# Patient Record
Sex: Male | Born: 1965 | Race: Black or African American | Hispanic: No | Smoking: Former smoker
Health system: Southern US, Community
[De-identification: ages and names within clinical notes are randomized; demographics above are authoritative.]

## PROBLEM LIST (undated history)

## (undated) DIAGNOSIS — J449 Chronic obstructive pulmonary disease, unspecified: Secondary | ICD-10-CM

## (undated) DIAGNOSIS — C61 Malignant neoplasm of prostate: Secondary | ICD-10-CM

## (undated) DIAGNOSIS — E7800 Pure hypercholesterolemia, unspecified: Secondary | ICD-10-CM

## (undated) DIAGNOSIS — G4730 Sleep apnea, unspecified: Secondary | ICD-10-CM

## (undated) DIAGNOSIS — K469 Unspecified abdominal hernia without obstruction or gangrene: Secondary | ICD-10-CM

## (undated) DIAGNOSIS — I10 Essential (primary) hypertension: Secondary | ICD-10-CM

## (undated) HISTORY — PX: SHOULDER SURGERY: SHX246

## (undated) HISTORY — PX: PROSTATE BIOPSY: SHX241

---

## 1999-06-16 ENCOUNTER — Emergency Department (HOSPITAL_COMMUNITY): Admission: EM | Admit: 1999-06-16 | Discharge: 1999-06-17 | Payer: Self-pay | Admitting: Internal Medicine

## 2002-05-01 ENCOUNTER — Emergency Department (HOSPITAL_COMMUNITY): Admission: EM | Admit: 2002-05-01 | Discharge: 2002-05-01 | Payer: Self-pay | Admitting: Emergency Medicine

## 2003-04-01 ENCOUNTER — Emergency Department (HOSPITAL_COMMUNITY): Admission: AD | Admit: 2003-04-01 | Discharge: 2003-04-01 | Payer: Self-pay | Admitting: Emergency Medicine

## 2003-08-13 ENCOUNTER — Emergency Department (HOSPITAL_COMMUNITY): Admission: EM | Admit: 2003-08-13 | Discharge: 2003-08-13 | Payer: Self-pay | Admitting: Emergency Medicine

## 2005-01-29 ENCOUNTER — Emergency Department (HOSPITAL_COMMUNITY): Admission: EM | Admit: 2005-01-29 | Discharge: 2005-01-30 | Payer: Self-pay | Admitting: Emergency Medicine

## 2005-02-06 ENCOUNTER — Encounter: Admission: RE | Admit: 2005-02-06 | Discharge: 2005-02-06 | Payer: Self-pay | Admitting: Orthopedic Surgery

## 2005-02-12 ENCOUNTER — Encounter: Admission: RE | Admit: 2005-02-12 | Discharge: 2005-02-12 | Payer: Self-pay | Admitting: Orthopedic Surgery

## 2006-02-07 ENCOUNTER — Emergency Department (HOSPITAL_COMMUNITY): Admission: EM | Admit: 2006-02-07 | Discharge: 2006-02-07 | Payer: Self-pay | Admitting: Emergency Medicine

## 2007-03-28 ENCOUNTER — Emergency Department (HOSPITAL_COMMUNITY): Admission: EM | Admit: 2007-03-28 | Discharge: 2007-03-29 | Payer: Self-pay | Admitting: Emergency Medicine

## 2007-04-11 ENCOUNTER — Telehealth (INDEPENDENT_AMBULATORY_CARE_PROVIDER_SITE_OTHER): Payer: Self-pay | Admitting: *Deleted

## 2007-12-07 ENCOUNTER — Emergency Department (HOSPITAL_COMMUNITY): Admission: EM | Admit: 2007-12-07 | Discharge: 2007-12-08 | Payer: Self-pay | Admitting: Emergency Medicine

## 2008-04-19 ENCOUNTER — Emergency Department (HOSPITAL_COMMUNITY): Admission: EM | Admit: 2008-04-19 | Discharge: 2008-04-19 | Payer: Self-pay | Admitting: Emergency Medicine

## 2010-06-20 ENCOUNTER — Encounter: Payer: Self-pay | Admitting: Emergency Medicine

## 2011-02-24 LAB — COMPREHENSIVE METABOLIC PANEL WITH GFR
ALT: 28
AST: 18
Alkaline Phosphatase: 77
GFR calc Af Amer: >60
Glucose, Bld: 140 — ABNORMAL HIGH
Potassium: 3.7
Sodium: 139
Total Protein: 7

## 2011-02-24 LAB — URINALYSIS, ROUTINE W REFLEX MICROSCOPIC
Bilirubin Urine: NEGATIVE
Glucose, UA: NEGATIVE
Hgb urine dipstick: NEGATIVE
Ketones, ur: NEGATIVE
Nitrite: NEGATIVE
Protein, ur: NEGATIVE
Specific Gravity, Urine: 1.019
Urobilinogen, UA: 1
pH: 7

## 2011-02-24 LAB — CBC
HCT: 42.6
Hemoglobin: 14.5
MCHC: 34.1
MCV: 86.8
Platelets: 176
RBC: 4.91
RDW: 14.4
WBC: 8.6

## 2011-02-24 LAB — DIFFERENTIAL
Basophils Absolute: 0.1
Basophils Relative: 1
Eosinophils Absolute: 0.1
Eosinophils Relative: 1
Lymphocytes Relative: 27
Lymphs Abs: 2.4
Monocytes Absolute: 0.6
Monocytes Relative: 7
Neutro Abs: 5.5
Neutrophils Relative %: 64

## 2011-02-24 LAB — COMPREHENSIVE METABOLIC PANEL
Albumin: 3.4 — ABNORMAL LOW
BUN: 15
CO2: 27
Calcium: 9.8
Chloride: 104
Creatinine, Ser: 0.88
GFR calc non Af Amer: >60
Total Bilirubin: 0.6

## 2011-02-24 LAB — LIPASE, BLOOD: Lipase: 18

## 2011-03-09 LAB — DIFFERENTIAL
Basophils Relative: 1
Lymphocytes Relative: 36
Lymphs Abs: 2.8
Monocytes Relative: 7
Neutro Abs: 4.3
Neutrophils Relative %: 55

## 2011-03-09 LAB — I-STAT 8, (EC8 V) (CONVERTED LAB)
Acid-Base Excess: 1
Chloride: 103
HCT: 48
Operator id: 234501
Potassium: 3.9
pCO2, Ven: 52.5 — ABNORMAL HIGH

## 2011-03-09 LAB — CBC
RBC: 4.86
WBC: 7.9

## 2011-03-09 LAB — POCT I-STAT CREATININE: Creatinine, Ser: 1

## 2011-03-09 LAB — URINALYSIS, ROUTINE W REFLEX MICROSCOPIC
Nitrite: NEGATIVE
Specific Gravity, Urine: 1.025
Urobilinogen, UA: 1

## 2011-08-06 ENCOUNTER — Encounter (HOSPITAL_COMMUNITY): Payer: Self-pay | Admitting: *Deleted

## 2011-08-06 ENCOUNTER — Other Ambulatory Visit: Payer: Self-pay

## 2011-08-06 ENCOUNTER — Emergency Department (HOSPITAL_COMMUNITY): Payer: Self-pay

## 2011-08-06 ENCOUNTER — Emergency Department (HOSPITAL_COMMUNITY)
Admission: EM | Admit: 2011-08-06 | Discharge: 2011-08-06 | Disposition: A | Discharge status: Home / Self Care | Payer: Self-pay | Attending: Emergency Medicine | Admitting: Emergency Medicine

## 2011-08-06 DIAGNOSIS — R000 Tachycardia, unspecified: Secondary | ICD-10-CM | POA: Insufficient documentation

## 2011-08-06 DIAGNOSIS — R0602 Shortness of breath: Secondary | ICD-10-CM | POA: Insufficient documentation

## 2011-08-06 DIAGNOSIS — Z794 Long term (current) use of insulin: Secondary | ICD-10-CM | POA: Insufficient documentation

## 2011-08-06 DIAGNOSIS — R42 Dizziness and giddiness: Secondary | ICD-10-CM | POA: Insufficient documentation

## 2011-08-06 DIAGNOSIS — R4182 Altered mental status, unspecified: Secondary | ICD-10-CM | POA: Insufficient documentation

## 2011-08-06 DIAGNOSIS — E119 Type 2 diabetes mellitus without complications: Secondary | ICD-10-CM | POA: Insufficient documentation

## 2011-08-06 DIAGNOSIS — E860 Dehydration: Secondary | ICD-10-CM | POA: Insufficient documentation

## 2011-08-06 DIAGNOSIS — R911 Solitary pulmonary nodule: Secondary | ICD-10-CM | POA: Insufficient documentation

## 2011-08-06 DIAGNOSIS — I10 Essential (primary) hypertension: Secondary | ICD-10-CM | POA: Insufficient documentation

## 2011-08-06 HISTORY — DX: Essential (primary) hypertension: I10

## 2011-08-06 HISTORY — DX: Pure hypercholesterolemia, unspecified: E78.00

## 2011-08-06 LAB — D-DIMER, QUANTITATIVE: D-Dimer, Quant: 1.28 ug/mL-FEU — ABNORMAL HIGH (ref 0.00–0.48)

## 2011-08-06 LAB — BASIC METABOLIC PANEL
Calcium: 9.8 mg/dL (ref 8.4–10.5)
Creatinine, Ser: 0.83 mg/dL (ref 0.50–1.35)
GFR calc Af Amer: >90 mL/min (ref >90)
GFR calc non Af Amer: >90 mL/min (ref >90)

## 2011-08-06 LAB — GLUCOSE, CAPILLARY: Glucose-Capillary: 233 mg/dL — ABNORMAL HIGH (ref 70–99)

## 2011-08-06 LAB — DIFFERENTIAL
Basophils Absolute: 0 10*3/uL (ref 0.0–0.1)
Basophils Relative: 0 % (ref 0–1)
Eosinophils Relative: 0 % (ref 0–5)
Monocytes Absolute: 0.6 10*3/uL (ref 0.1–1.0)
Neutro Abs: 6.3 10*3/uL (ref 1.7–7.7)

## 2011-08-06 LAB — CBC
HCT: 40.3 % (ref 39.0–52.0)
MCHC: 35 g/dL (ref 30.0–36.0)
RDW: 14.1 % (ref 11.5–15.5)

## 2011-08-06 MED ORDER — SODIUM CHLORIDE 0.9 % IV BOLUS (SEPSIS): 1000.0000 mL | Freq: Once | INTRAVENOUS | Status: DC

## 2011-08-06 MED ORDER — IOHEXOL 300 MG/ML  SOLN
100.0000 mL | Freq: Once | INTRAMUSCULAR | Status: AC | PRN
  Administered 2011-08-06: 100 mL via INTRAVENOUS

## 2011-08-06 NOTE — ED Notes (Signed)
Pt's visitor came to ER window requesting help to get pt out of the car. Pt reported that he could not breathe and was having chest pain. Pt brought to ER window. Vital signs: BP 181/107, heart rate 123, O2 sats 100%, respirations 22. Pt states that he took some insulin prior to arrival and thinks that blood sugar may be low.

## 2011-08-06 NOTE — ED Notes (Signed)
Pt brought to ED by neighbor with reports of having a hard time catching breath and a dry mouth, pt noted to be slow to respond which is a change per neighbor, pt also noted to stare for brief intervals before responding.

## 2011-08-06 NOTE — ED Provider Notes (Signed)
History     CSN: [id number]  Arrival date & time 08/06/11  1607   First MD Initiated Contact with Patient 08/06/11 1650      Chief Complaint  Patient presents with  . Shortness of Breath  . Altered Mental Status    (Consider location/radiation/quality/duration/timing/severity/associated sxs/prior treatment) HPI  45yoM h/o insulin DDM, HTN, HLD since with lightheadedness, shortness of breath which he describes as "difficulty catching my breath when I felt dizzy". The patient states he had several episodes of lightheadedness prior to arrival. He states that it was not associated with standing up or sitting up. He states the episodes have been resolving spontaneously. He does so presyncopal. He denies headache, dizziness, change in vision. He denies chest pain. However he states that he feels like he can't take a deep breath and it has some shortness of breath with these episodes. He states he feels mildly dehydrated now because he hasn't had anything to drink. He is to stare off in space oriented neighbor during these episodes. He denies numbness, tingling, weakness of his extremities. No h/o CVA. Denies h/o VTE in self or family. No recent hosp/surg/immob. No h/o cancer. Denies exogenous hormone use, no leg pain or swelling   ED Notes, ED Provider Notes from 08/06/11 0000 to 08/06/11 16:33:41       Alvina Chou, RN 08/06/2011 16:28      Pt brought to ED by neighbor with reports of having a hard time catching breath and a dry mouth, pt noted to be slow to respond which is a change per neighbor, pt also noted to stare for brief intervals before responding.         Darla Lesches, RN 08/06/2011 16:15      Pt's visitor came to ER window requesting help to get pt out of the car. Pt reported that he could not breathe and was having chest pain. Pt brought to ER window. Vital signs: BP 181/107, heart rate 123, O2 sats 100%, respirations 22. Pt states that he took some insulin prior to arrival and  thinks that blood sugar may be low.    Past Medical History  Diagnosis Date  . Diabetes mellitus   . Hypertension   . High cholesterol     Past Surgical History  Procedure Date  . Shoulder surgery     right    History reviewed. No pertinent family history.  History  Substance Use Topics  . Smoking status: Current Everyday Smoker -- 0.5 packs/day  . Smokeless tobacco: Never Used  . Alcohol Use: Yes     socially    Review of Systems  All other systems reviewed and are negative.  except as noted HPI   Allergies  Review of patient's allergies indicates no known allergies.  Home Medications   Current Outpatient Rx  Name Route Sig Dispense Refill  . INSULIN GLARGINE 100 UNIT/ML [place] SOLN Subcutaneous Inject 55 Units into the skin at bedtime.    Marland Kitchen LISINOPRIL 10 MG PO TABS Oral Take 10 mg by mouth daily.    Marland Kitchen METFORMIN HCL 500 MG PO TABS Oral Take 1,500 mg by mouth daily with breakfast.    . SIMVASTATIN 20 MG PO TABS Oral Take 20 mg by mouth daily.      BP 165/84  Pulse 105  Temp(Src) 99 F (37.2 C) (Oral)  Resp 28  SpO2 100%  Physical Exam  Nursing note and vitals reviewed. Constitutional: He is oriented to person, place, and time. He  appears well-developed and well-nourished. No distress.  HENT:  Head: Atraumatic.       Mm dry  Eyes: Conjunctivae are normal. Pupils are equal, round, and reactive to light.  Neck: Neck supple.  Cardiovascular: Regular rhythm, normal heart sounds and intact distal pulses.  Exam reveals no gallop and no friction rub.   No murmur heard.      tachycardic  Pulmonary/Chest: Effort normal. No respiratory distress. He has no wheezes. He has no rales.  Abdominal: Soft. Bowel sounds are normal. There is no tenderness. There is no rebound and no guarding.  Musculoskeletal: Normal range of motion. He exhibits no edema and no tenderness.  Neurological: He is alert and oriented to person, place, and time. No cranial nerve deficit. He  exhibits normal muscle tone. Coordination normal.  Skin: Skin is warm and dry.  Psychiatric: He has a normal mood and affect.    Date: 08/06/2011  Rate: 102  Rhythm: sinus tachycardia  QRS Axis: normal  Intervals: normal  ST/T Wave abnormalities: nonspecific T wave changes t wave inversions inf lat leads  Conduction Disutrbances:none  Narrative Interpretation:   Old EKG Reviewed: none available  ED Course  Procedures (including critical care time)  Labs Reviewed  GLUCOSE, CAPILLARY - Abnormal; Notable for the following:    Glucose-Capillary 233 (*)    All other components within normal limits  BASIC METABOLIC PANEL - Abnormal; Notable for the following:    Glucose, Bld 241 (*)    All other components within normal limits  D-DIMER, QUANTITATIVE - Abnormal; Notable for the following:    D-Dimer, Quant 1.28 (*)    All other components within normal limits  CBC  DIFFERENTIAL  TROPONIN I   Dg Chest 2 View  08/06/2011  *RADIOLOGY REPORT*  Clinical Data: Shortness of breath.  Altered mental status. Smoker.  Hypertension.  CHEST - 2 VIEW  Comparison: 02/07/2006.  Findings: Stable enlarged cardiac silhouette and prominent pulmonary vasculature. Prominent interstitial markings with mild progression.  Thoracic spine degenerative changes.  IMPRESSION:  1.  Mild increase in prominence of the interstitial markings.  This is most likely due to mildly progressive chronic interstitial lung disease.  Interstitial pulmonary edema is less likely. 2.  Stable cardiomegaly and pulmonary vascular congestion.  Original Report Authenticated By: Darrol Angel, M.D.   Ct Angio Chest W/cm &/or Wo Cm  08/06/2011  *RADIOLOGY REPORT*  Clinical Data: Shortness of breath and altered mental status.  CT ANGIOGRAPHY CHEST  Technique:  Multidetector CT imaging of the chest using the standard protocol during bolus administration of intravenous contrast. Multiplanar reconstructed images including MIPs were obtained and  reviewed to evaluate the vascular anatomy.  Contrast: OMNIPAQUE IOHEXOL 300 MG/ML IJ SOLN  Comparison: 02/07/2006 and chest radiographs obtained earlier today.  Findings: Again demonstrated is suboptimal opacification of the pulmonary arteries, possibly due to poor cardiac output.  No gross pulmonary arterial filling defects are seen.  The previously demonstrated 3 mm subpleural right upper lobe nodule continues to measure 3 mm in maximum diameter on image number 24. A 4 mm subpleural nodule in the right lower lobe on image number 55 is better visualized on the sagittal reconstruction image number 62. This is slightly larger and better visualized than seen on the previous sagittal reconstruction image number 59.  No new lung nodules and no enlarged lymph nodes.  Thoracic spine degenerative changes.  The included portion of the upper abdomen is unremarkable.  Again demonstrated is some limitation due to the  large size of the patient with resultant photon starvation.  IMPRESSION:  1.  Suboptimally opacified pulmonary arteries with no gross pulmonary emboli seen. 2. Mild increase in size of a 4 mm subpleural nodule in the right lower lobe.  The slow increase in size over a 5.5 year period of time is compatible with a benign process. 3.  Stable 3 mm right upper lobe nodule. 4.  No acute abnormality.  Original Report Authenticated By: Darrol Angel, M.D.    1. Dizziness   2. Dehydration   3. Pulmonary nodule    MDM  The patient presents with intermittent lightheadedness and "difficulty catching my breath" which has resolved. Patient feels improved at this time. Tachycardic on arrival with chronic interstitial changes on his chest x-ray. CT angiography chest is negative for pulmonary embolus. He has benign appearing pulmonary nodules which are very small. He will follow with his primary care doctor for this. His original triage note they state that the patient had chest pain. Canals are shared with him the  abnormal EKG with T-wave inversions. The patient states that he would like to go home and he does not have chest pain at this time. Clinically, I do not feel that the patient is having an NSTEMI, although he does have RF for ACS. The patient denies chest pain with me. I asked her specifically about did note and he states that he did not say that he had chest pain. Clinically, he appears dehydrated and not clinically fluid overloaded. His tachycardia the 120s this resolved after 1 L of IV fluids. He states he feels much better. He is ambulatory here without lightheadedness. He feels back to his baseline. I did share the abnormal CT scan with the patient and he states he will follow with his physician to follow with the pulmonary nodules.        Forbes Cellar, MD 08/06/11 2159

## 2011-08-06 NOTE — Discharge Instructions (Signed)
Dehydration, Adult Dehydration is when you lose more fluids from the body than you take in. Vital organs like the kidneys, brain, and heart cannot function without a proper amount of fluids and salt. Any loss of fluids from the body can cause dehydration.  CAUSES   Vomiting.   Diarrhea.   Excessive sweating.   Excessive urine output.   Fever.  SYMPTOMS  Mild dehydration  Thirst.   Dry lips.   Slightly dry mouth.  Moderate dehydration  Very dry mouth.   Sunken eyes.   Skin does not bounce back quickly when lightly pinched and released.   Dark urine and decreased urine production.   Decreased tear production.   Headache.  Severe dehydration  Very dry mouth.   Extreme thirst.   Rapid, weak pulse (more than 100 beats per minute at rest).   Cold hands and feet.   Not able to sweat in spite of heat and temperature.   Rapid breathing.   Blue lips.   Confusion and lethargy.   Difficulty being awakened.   Minimal urine production.   No tears.  DIAGNOSIS  Your caregiver will diagnose dehydration based on your symptoms and your exam. Blood and urine tests will help confirm the diagnosis. The diagnostic evaluation should also identify the cause of dehydration. TREATMENT  Treatment of mild or moderate dehydration can often be done at home by increasing the amount of fluids that you drink. It is best to drink small amounts of fluid more often. Drinking too much at one time can make vomiting worse. Refer to the home care instructions below. Severe dehydration needs to be treated at the hospital where you will probably be given intravenous (IV) fluids that contain water and electrolytes. HOME CARE INSTRUCTIONS   Ask your caregiver about specific rehydration instructions.   Drink enough fluids to keep your urine clear or pale yellow.   Drink small amounts frequently if you have nausea and vomiting.   Eat as you normally do.   Avoid:   Foods or drinks high in  sugar.   Carbonated drinks.   Juice.   Extremely hot or cold fluids.   Drinks with caffeine.   Fatty, greasy foods.   Alcohol.   Tobacco.   Overeating.   Gelatin desserts.   Wash your hands well to avoid spreading bacteria and viruses.   Only take over-the-counter or prescription medicines for pain, discomfort, or fever as directed by your caregiver.   Ask your caregiver if you should continue all prescribed and over-the-counter medicines.   Keep all follow-up appointments with your caregiver.  SEEK MEDICAL CARE IF:  You have abdominal pain and it increases or stays in one area (localizes).   You have a rash, stiff neck, or severe headache.   You are irritable, sleepy, or difficult to awaken.   You are weak, dizzy, or extremely thirsty.  SEEK IMMEDIATE MEDICAL CARE IF:   You are unable to keep fluids down or you get worse despite treatment.   You have frequent episodes of vomiting or diarrhea.   You have blood or green matter (bile) in your vomit.   You have blood in your stool or your stool looks black and tarry.   You have not urinated in 6 to 8 hours, or you have only urinated a small amount of very dark urine.   You have a fever.   You faint.  MAKE SURE YOU:   Understand these instructions.   Will watch your condition.     Will get help right away if you are not doing well or get worse.  Document Released: 05/16/2005 Document Revised: 05/05/2011 Document Reviewed: 01/03/2011 [facility] Patient Information 2012 [place], [place]. Dizziness Dizziness is a common problem. It is a feeling of unsteadiness or lightheadedness. You may feel like you are about to faint. Dizziness can lead to injury if you stumble or fall. A person of any age group can suffer from dizziness, but dizziness is more common in older adults. CAUSES  Dizziness can be caused by many different things, including:  Middle ear problems.   Standing for too long.   Infections.   An  allergic reaction.   Aging.   An emotional response to something, such as the sight of blood.   Side effects of medicines.   Fatigue.   Problems with circulation or blood pressure.   Excess use of alcohol, medicines, or illegal drug use.   Breathing too fast (hyperventilation).   An arrhythmia or problems with your heart rhythm.   Low red blood cell count (anemia).   Pregnancy.   Vomiting, diarrhea, fever, or other illnesses that cause dehydration.   Diseases or conditions such as Parkinson's disease, high blood pressure (hypertension), diabetes, and thyroid problems.   Exposure to extreme heat.  DIAGNOSIS  To find the cause of your dizziness, your caregiver may do a physical exam, lab tests, radiologic imaging scans, or an electrocardiography test (ECG).  TREATMENT  Treatment of dizziness depends on the cause of your symptoms and can vary greatly. HOME CARE INSTRUCTIONS   Drink enough fluids to keep your urine clear or pale yellow. This is especially important in very hot weather. In the elderly, it is also important in cold weather.   If your dizziness is caused by medicines, take them exactly as directed. When taking blood pressure medicines, it is especially important to get up slowly.   Rise slowly from chairs and steady yourself until you feel okay.   In the morning, first sit up on the side of the bed. When this seems okay, stand slowly while holding onto something until you know your balance is fine.   If you need to stand in one place for a long time, be sure to move your legs often. Tighten and relax the muscles in your legs while standing.   If dizziness continues to be a problem, have someone stay with you for a day or two. Do this until you feel you are well enough to stay alone. Have the person call your caregiver if he or she notices changes in you that are concerning.   Do not drive or use heavy machinery if you feel dizzy.  SEEK IMMEDIATE MEDICAL CARE  IF:   Your dizziness or lightheadedness gets worse.   You feel nauseous or vomit.   You develop problems with talking, walking, weakness, or using your arms, hands, or legs.   You are not thinking clearly or you have difficulty forming sentences. It may take a friend or family member to determine if your thinking is normal.   You develop chest pain, abdominal pain, shortness of breath, or sweating.   Your vision changes.   You notice any bleeding.   You have side effects from medicine that seems to be getting worse rather than better.  MAKE SURE YOU:   Understand these instructions.   Will watch your condition.   Will get help right away if you are not doing well or get worse.  Document Released: 11/09/2000 Document  Revised: 05/05/2011 Document Reviewed: 12/03/2010 [facility] Patient Information 2012 [place], [place]. RESOURCE GUIDE  Dental Problems  Patients with Medicaid: [facility] [phone] [address]                                           [phone] Phone:  [phone]                                                   Phone:  [phone]  If unable to pay or uninsured, contact:  [facility] or [facility]. to become qualified for the adult dental clinic.  Chronic Pain Problems Contact [facility]  [phone] Patients need to be referred by their primary care doctor.  Insufficient Money for Medicine Contact [employer] Way:  call "211" or [facility] (213) 305-6290.  No Primary Care Doctor Call [facility]  [phone] Other agencies that provide inexpensive medical care    [facility]  [phone]    [facility]  [phone]    [facility]  [phone]    [facility]  [phone]    [facility]  [phone]    [facility]  [phone]  [facility]  [phone] [facility]   [phone] [facility]   [phone] (emergency services [phone])  Abuse/Neglect [facility] Child Abuse Hotline 217-756-0525 [facility] Child Abuse Hotline (207) 021-6092 (After Hours)  Emergency Shelter [facility] ([phone]  [facility] [place] [facility] [phone] [facility] [phone]  MRSA Hotline #:   (705) 693-5268    [facility] Resources  [facility]  [employer]                           [facility] Dept. [address] [place]                     [address]         [address]  Blondell Reveal Phone:  [phone]                                  Phone:  [phone]                   Phone:  [phone]  [facility] Phone:  [phone]  [facility] Child Abuse Hotline [phone] (After Hours)

## 2011-08-06 NOTE — ED Notes (Signed)
Bed:WA15<BR> Expected date:<BR> Expected time:<BR> Means of arrival:<BR> Comments:<BR> Hold for triage 1 

## 2011-08-06 NOTE — ED Notes (Signed)
Pt continues to deny chest pain, keeps repeating, "I just need some air".

## 2011-08-10 ENCOUNTER — Emergency Department (HOSPITAL_COMMUNITY): Payer: Self-pay

## 2011-08-10 ENCOUNTER — Encounter (HOSPITAL_COMMUNITY): Payer: Self-pay | Admitting: Emergency Medicine

## 2011-08-10 ENCOUNTER — Emergency Department (HOSPITAL_COMMUNITY)
Admission: EM | Admit: 2011-08-10 | Discharge: 2011-08-10 | Disposition: A | Discharge status: Home / Self Care | Payer: Self-pay | Attending: Emergency Medicine | Admitting: Emergency Medicine

## 2011-08-10 ENCOUNTER — Other Ambulatory Visit: Payer: Self-pay

## 2011-08-10 DIAGNOSIS — Z794 Long term (current) use of insulin: Secondary | ICD-10-CM | POA: Insufficient documentation

## 2011-08-10 DIAGNOSIS — R791 Abnormal coagulation profile: Secondary | ICD-10-CM | POA: Insufficient documentation

## 2011-08-10 DIAGNOSIS — E785 Hyperlipidemia, unspecified: Secondary | ICD-10-CM | POA: Insufficient documentation

## 2011-08-10 DIAGNOSIS — R4182 Altered mental status, unspecified: Secondary | ICD-10-CM | POA: Insufficient documentation

## 2011-08-10 DIAGNOSIS — I10 Essential (primary) hypertension: Secondary | ICD-10-CM | POA: Insufficient documentation

## 2011-08-10 DIAGNOSIS — E119 Type 2 diabetes mellitus without complications: Secondary | ICD-10-CM | POA: Insufficient documentation

## 2011-08-10 DIAGNOSIS — F17200 Nicotine dependence, unspecified, uncomplicated: Secondary | ICD-10-CM | POA: Insufficient documentation

## 2011-08-10 DIAGNOSIS — R0602 Shortness of breath: Secondary | ICD-10-CM

## 2011-08-10 DIAGNOSIS — R42 Dizziness and giddiness: Secondary | ICD-10-CM

## 2011-08-10 LAB — DIFFERENTIAL
Basophils Absolute: 0 10*3/uL (ref 0.0–0.1)
Basophils Relative: 0 % (ref 0–1)
Eosinophils Absolute: 0.1 10*3/uL (ref 0.0–0.7)
Eosinophils Relative: 1 % (ref 0–5)
Lymphocytes Relative: 22 % (ref 12–46)
Lymphs Abs: 1.9 10*3/uL (ref 0.7–4.0)
Monocytes Absolute: 0.6 10*3/uL (ref 0.1–1.0)
Monocytes Relative: 7 % (ref 3–12)
Neutro Abs: 6.4 10*3/uL (ref 1.7–7.7)
Neutrophils Relative %: 71 % (ref 43–77)

## 2011-08-10 LAB — CBC
Hemoglobin: 15.1 g/dL (ref 13.0–17.0)
MCH: 29.4 pg (ref 26.0–34.0)
MCHC: 34.8 g/dL (ref 30.0–36.0)
Platelets: 193 10*3/uL (ref 150–400)
RBC: 5.14 MIL/uL (ref 4.22–5.81)

## 2011-08-10 LAB — BASIC METABOLIC PANEL
BUN: 14 mg/dL (ref 6–23)
CO2: 21 mEq/L (ref 19–32)
Calcium: 9 mg/dL (ref 8.4–10.5)
Chloride: 102 mEq/L (ref 96–112)
Creatinine, Ser: 0.71 mg/dL (ref 0.50–1.35)
GFR calc Af Amer: >90 mL/min (ref >90)
GFR calc non Af Amer: >90 mL/min (ref >90)
Glucose, Bld: 163 mg/dL — ABNORMAL HIGH (ref 70–99)
Potassium: 3.7 mEq/L (ref 3.5–5.1)
Sodium: 136 mEq/L (ref 135–145)

## 2011-08-10 LAB — PRO B NATRIURETIC PEPTIDE: Pro B Natriuretic peptide (BNP): 8.2 pg/mL (ref 0–125)

## 2011-08-10 LAB — TROPONIN I: Troponin I: <0.3 ng/mL (ref <0.30)

## 2011-08-10 MED ORDER — ALBUTEROL SULFATE (5 MG/ML) 0.5% IN NEBU
2.5000 mg | INHALATION_SOLUTION | Freq: Once | RESPIRATORY_TRACT | Status: AC
  Administered 2011-08-10: 2.5 mg via RESPIRATORY_TRACT

## 2011-08-10 MED ORDER — IPRATROPIUM BROMIDE 0.02 % IN SOLN
0.5000 mg | Freq: Once | RESPIRATORY_TRACT | Status: AC
  Administered 2011-08-10: 0.5 mg via RESPIRATORY_TRACT

## 2011-08-10 MED ORDER — ALBUTEROL SULFATE (5 MG/ML) 0.5% IN NEBU
2.5000 mg | INHALATION_SOLUTION | RESPIRATORY_TRACT | Status: DC
  Filled 2011-08-10: qty 0.5

## 2011-08-10 MED ORDER — LORAZEPAM 1 MG PO TABS
2.0000 mg | ORAL_TABLET | Freq: Once | ORAL | Status: AC
  Administered 2011-08-10: 2 mg via ORAL
  Filled 2011-08-10: qty 2

## 2011-08-10 MED ORDER — IPRATROPIUM BROMIDE 0.02 % IN SOLN
0.5000 mg | RESPIRATORY_TRACT | Status: DC
  Filled 2011-08-10: qty 2.5

## 2011-08-10 NOTE — Progress Notes (Signed)
Pt listed as self pay with no insurance coverage Pt confirms he is self pay [place] resident CM and [facility] coordinator spoke with him Pt offered [facility] services to assist with finding a [place] self pay provider Pt accepted information  Pt reports having a part time job

## 2011-08-10 NOTE — ED Notes (Signed)
Pt states he is feeling much better and is ready for discharge. NAD noted at this time.

## 2011-08-10 NOTE — ED Notes (Signed)
Began going over discharge instructions with pt and pt began breathing fast and crying. States "There's something wrong with me and ya'll need to figure it out." Dr. Hyman Hopes notified that pt very anxious about discharge. States she will be in to see pt shortly

## 2011-08-10 NOTE — ED Notes (Signed)
States that he was seen Saturday for same feeling of shortness of breath. 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

## 2011-08-10 NOTE — ED Notes (Signed)
Patient transported to X-ray 

## 2011-08-10 NOTE — ED Notes (Signed)
Pt states that he is feeling better. NAD noted at this time.

## 2011-08-10 NOTE — Discharge Instructions (Signed)
Dizziness Dizziness is a common problem. It is a feeling of unsteadiness or lightheadedness. You may feel like you are about to faint. Dizziness can lead to injury if you stumble or fall. A person of any age group can suffer from dizziness, but dizziness is more common in older adults. CAUSES  Dizziness can be caused by many different things, including:  Middle ear problems.   Standing for too long.   Infections.   An allergic reaction.   Aging.   An emotional response to something, such as the sight of blood.   Side effects of medicines.   Fatigue.   Problems with circulation or blood pressure.   Excess use of alcohol, medicines, or illegal drug use.   Breathing too fast (hyperventilation).   An arrhythmia or problems with your heart rhythm.   Low red blood cell count (anemia).   Pregnancy.   Vomiting, diarrhea, fever, or other illnesses that cause dehydration.   Diseases or conditions such as Parkinson's disease, high blood pressure (hypertension), diabetes, and thyroid problems.   Exposure to extreme heat.  DIAGNOSIS  To find the cause of your dizziness, your caregiver may do a physical exam, lab tests, radiologic imaging scans, or an electrocardiography test (ECG).  TREATMENT  Treatment of dizziness depends on the cause of your symptoms and can vary greatly. HOME CARE INSTRUCTIONS   Drink enough fluids to keep your urine clear or pale yellow. This is especially important in very hot weather. In the elderly, it is also important in cold weather.   If your dizziness is caused by medicines, take them exactly as directed. When taking blood pressure medicines, it is especially important to get up slowly.   Rise slowly from chairs and steady yourself until you feel okay.   In the morning, first sit up on the side of the bed. When this seems okay, stand slowly while holding onto something until you know your balance is fine.   If you need to stand in one place for a  long time, be sure to move your legs often. Tighten and relax the muscles in your legs while standing.   If dizziness continues to be a problem, have someone stay with you for a day or two. Do this until you feel you are well enough to stay alone. Have the person call your caregiver if he or she notices changes in you that are concerning.   Do not drive or use heavy machinery if you feel dizzy.  SEEK IMMEDIATE MEDICAL CARE IF:   Your dizziness or lightheadedness gets worse.   You feel nauseous or vomit.   You develop problems with talking, walking, weakness, or using your arms, hands, or legs.   You are not thinking clearly or you have difficulty forming sentences. It may take a friend or family member to determine if your thinking is normal.   You develop chest pain, abdominal pain, shortness of breath, or sweating.   Your vision changes.   You notice any bleeding.   You have side effects from medicine that seems to be getting worse rather than better.  MAKE SURE YOU:   Understand these instructions.   Will watch your condition.   Will get help right away if you are not doing well or get worse.  Document Released: 11/09/2000 Document Revised: 05/05/2011 Document Reviewed: 12/03/2010 [facility] Patient Information 2012 [place], [place].  You will need to follow up with your doctor for possible Holter monitor placement. If you cannot follow up  with your doctor, please make an appointment at one of the clinics below:  RESOURCE GUIDE  Dental Problems  Patients with Medicaid: [facility] [phone] [address]                                           [phone] [address] Phone:  [phone]                                                   Phone:  [phone]  If unable to pay or uninsured, contact:  [facility] or [facility] to become qualified for the adult dental clinic.  Chronic Pain Problems Contact [facility]  [phone] Patients need to be referred by their primary care doctor.  Insufficient Money for Medicine Contact [employer]:  call "211" or [facility] [phone].  No Primary Care Doctor Call [facility]  [phone] Other agencies that provide inexpensive medical care    [facility]  [phone]    [facility]  [phone]    [facility]  [phone]    [facility]  [phone]    [facility]  [phone]    [facility]  [phone]  Psychological Services [facility]  [phone] [facility]  [phone] [facility]   [phone] (emergency services [phone])  Abuse/Neglect [facility] Child Abuse Hotline [phone] [facility] Child Abuse Hotline 646-315-3355 (After Hours)  Emergency Shelter [facility] [phone]  [facility] [place] [facility] [phone] [facility] [phone]  MRSA Hotline #:   805-085-1513    [facility] Resources  [facility]  [employer]                           [facility] Dept. [address] [place]                     [address]         [address]  Blondell Reveal Phone:  [phone]                                  Phone:  [phone]                   Phone:  [phone]  [facility] Phone:  [phone]  [facility] Child Abuse Hotline [phone] (After Hours)

## 2011-08-10 NOTE — ED Provider Notes (Addendum)
History     CSN: [id number]  Arrival date & time 08/10/11  7829   First MD Initiated Contact with Patient 08/10/11 0857      Chief Complaint  Patient presents with  . Shortness of Breath    (Consider location/radiation/quality/duration/timing/severity/associated sxs/prior treatment) HPI  45yoM h/o IDDM, HTN, HLD pw dizziness, shortness of breath. Patient seen here 08/06/2011 for same complaint. See previous note below. He states he continues to have these episodes this morning. Describes as lightheadedness and difficulty catching his breath at that time. He denies chest pain., Headache, nausea, vomiting, diaphoresis. He denies numbness, tingling, weakness of his extremities. Denies abdominal pain. During last visit he did have abnormal EKG. His labs including troponin were unremarkable except for elevated d-dimer. CT angiography chest was unremarkable for pulmonary embolus. He was given IV fluids and his vitals normalized he was able to a really and he was discharged home with his wife.  Arrival date & time 08/06/11 1607  First MD Initiated Contact with Patient 08/06/11 1650  Chief Complaint   Patient presents with   .  Shortness of Breath   .  Altered Mental Status    (Consider location/radiation/quality/duration/timing/severity/associated sxs/prior  treatment)  HPI  45yoM h/o insulin DDM, HTN, HLD since with lightheadedness, shortness of breath which he describes as "difficulty catching my breath when I felt dizzy". The patient states he had several episodes of lightheadedness prior to arrival. He states that it was not associated with standing up or sitting up. He states the episodes have been resolving spontaneously. He does so presyncopal. He denies headache, dizziness, change in vision. He denies chest pain. However he states that he feels like he can't take a deep breath and it has some shortness of breath with these episodes. He states he feels mildly dehydrated now because he  hasn't had anything to drink. He did stare off in space according to neighbor during these episodes. He denies numbness, tingling, weakness of his extremities. No h/o CVA. Denies h/o VTE in self or family. No recent hosp/surg/immob. No h/o cancer. Denies exogenous hormone use, no leg pain or swelling  Past Medical History  Diagnosis Date  . Diabetes mellitus   . Hypertension   . High cholesterol     Past Surgical History  Procedure Date  . Shoulder surgery     right    No family history on file.  History  Substance Use Topics  . Smoking status: Current Everyday Smoker -- 0.5 packs/day  . Smokeless tobacco: Never Used  . Alcohol Use: Yes     socially      Review of Systems  All other systems reviewed and are negative.   except as noted HPI    Allergies  Review of patient's allergies indicates no known allergies.  Home Medications   Current Outpatient Rx  Name Route Sig Dispense Refill  . INSULIN GLARGINE 100 UNIT/ML [place] SOLN Subcutaneous Inject 55 Units into the skin at bedtime.    Marland Kitchen LISINOPRIL 10 MG PO TABS Oral Take 10 mg by mouth daily.    Marland Kitchen METFORMIN HCL 500 MG PO TABS Oral Take 1,500 mg by mouth daily with breakfast.    . SIMVASTATIN 20 MG PO TABS Oral Take 20 mg by mouth daily.      BP 125/60  Pulse 72  Temp(Src) 97.6 F (36.4 C) (Oral)  Resp 24  Wt 300 lb (136.079 kg)  SpO2 98%  Physical Exam  Nursing note and vitals reviewed. Constitutional: He  is oriented to person, place, and time. He appears well-developed and well-nourished. No distress.  HENT:  Head: Atraumatic.  Mouth/Throat: Oropharynx is clear and moist.  Eyes: Conjunctivae are normal. Pupils are equal, round, and reactive to light.  Neck: Neck supple.  Cardiovascular: Normal rate, regular rhythm, normal heart sounds and intact distal pulses.  Exam reveals no gallop and no friction rub.   No murmur heard. Pulmonary/Chest: Effort normal. No respiratory distress. He has no wheezes. He has  no rales.  Abdominal: Soft. Bowel sounds are normal. There is no tenderness. There is no rebound and no guarding.  Musculoskeletal: Normal range of motion.  Neurological: He is alert and oriented to person, place, and time. No cranial nerve deficit. He exhibits normal muscle tone. Coordination normal.       Strength 5/5 all extremities No pronator drift No facial droop   Skin: Skin is warm and dry.  Psychiatric: He has a normal mood and affect.    Date: 08/10/2011  Rate: 86  Rhythm: normal sinus rhythm  QRS Axis: normal  Intervals: normal  ST/T Wave abnormalities: nonspecific T wave changes t wave inversion III  Conduction Disutrbances:none  Narrative Interpretation:   Old EKG Reviewed: changes noted   ED Course  Procedures (including critical care time)  Labs Reviewed  BASIC METABOLIC PANEL - Abnormal; Notable for the following:    Glucose, Bld 163 (*)    All other components within normal limits  CBC  DIFFERENTIAL  PRO B NATRIURETIC PEPTIDE  TROPONIN I   Dg Chest 2 View  08/10/2011  *RADIOLOGY REPORT*  Clinical Data: Chest tightness.  Shortness of breath.  History of diabetes and hypertension.  CHEST - 2 VIEW 08/10/2011:  Comparison: Two-view chest x-ray and CTA chest 08/06/2011, 02/07/2006 [facility].  Findings: Cardiac silhouette mildly enlarged but stable.  Pulmonary vascularity normal without evidence pulmonary edema.  Prominent bronchovascular markings diffusely and mild central peribronchial thickening, unchanged since the examination 4 days ago but worse than on the 2007 examination.  Lungs otherwise clear.  No pleural effusions.  Degenerative changes involving the thoracic spine.  IMPRESSION: Mild to moderate changes of bronchitis and/or asthma, unchanged since the examination 4 days ago but worse than in 2007.  Original Report Authenticated By: Arnell Sieving, M.D.   Ct Head Wo Contrast  08/10/2011  *RADIOLOGY REPORT*  Clinical Data: Lightheaded,  dizziness of  CT HEAD WITHOUT CONTRAST  Technique:  Contiguous axial images were obtained from the base of the skull through the vertex without contrast.  Comparison: PET CT 10/29 1008  Findings: No acute intracranial hemorrhage.  No focal mass lesion. No CT evidence of acute infarction.   No midline shift or mass effect.  No hydrocephalus.  Basilar cisterns are patent. Paranasal sinuses and mastoid air cells are clear.  Orbits are normal.  IMPRESSION: No acute intracranial findings.  Original Report Authenticated By: Genevive Bi, M.D.     1. Dizziness   2. Shortness of breath       MDM  Repeat ED visit for intermittent SOB and dizziness. Asx in ED. EKG without arrhythmia. Labs neg including trop, BNP. CXR with mild bronchitic changes but pt without constant sob, no h/o asthma/copd (former smoker), and no wheezing or hypoxia. Duoneb given anyway but low susp copd/asthma as cause of sx. Over two visits have eval for CHF, PE, ACS, intracranial lesion and w/u unremarkable. Will d/c home with PMD f/u for possible holter monitor placement. Ambulatory without sx prior to discharge.  BP 125/60  Pulse 72  Temp(Src) 97.6 F (36.4 C) (Oral)  Resp 24  Wt 300 lb (136.079 kg)  SpO2 98%      Forbes Cellar, MD 08/10/11 1403  Forbes Cellar, MD 08/10/11 1404

## 2011-08-10 NOTE — ED Notes (Signed)
Pt returned from xray

## 2011-08-10 NOTE — ED Notes (Signed)
Pt reports that he was driving and suddenly became sob. Denies any pain. Pt is A/O x4. Skin warm and dry. Respirations even, unlabored, shallow. NAD noted at this time.

## 2011-08-18 ENCOUNTER — Encounter (HOSPITAL_COMMUNITY): Payer: Self-pay | Admitting: Emergency Medicine

## 2011-08-18 ENCOUNTER — Emergency Department (HOSPITAL_COMMUNITY)
Admission: EM | Admit: 2011-08-18 | Discharge: 2011-08-18 | Disposition: A | Discharge status: Home / Self Care | Payer: Self-pay | Attending: Emergency Medicine | Admitting: Emergency Medicine

## 2011-08-18 ENCOUNTER — Other Ambulatory Visit: Payer: Self-pay

## 2011-08-18 ENCOUNTER — Emergency Department (HOSPITAL_COMMUNITY): Payer: Self-pay

## 2011-08-18 DIAGNOSIS — F17200 Nicotine dependence, unspecified, uncomplicated: Secondary | ICD-10-CM | POA: Insufficient documentation

## 2011-08-18 DIAGNOSIS — E119 Type 2 diabetes mellitus without complications: Secondary | ICD-10-CM | POA: Insufficient documentation

## 2011-08-18 DIAGNOSIS — J4489 Other specified chronic obstructive pulmonary disease: Secondary | ICD-10-CM | POA: Insufficient documentation

## 2011-08-18 DIAGNOSIS — I10 Essential (primary) hypertension: Secondary | ICD-10-CM | POA: Insufficient documentation

## 2011-08-18 DIAGNOSIS — R0789 Other chest pain: Secondary | ICD-10-CM | POA: Insufficient documentation

## 2011-08-18 DIAGNOSIS — J449 Chronic obstructive pulmonary disease, unspecified: Secondary | ICD-10-CM | POA: Insufficient documentation

## 2011-08-18 DIAGNOSIS — R0602 Shortness of breath: Secondary | ICD-10-CM | POA: Insufficient documentation

## 2011-08-18 HISTORY — DX: Chronic obstructive pulmonary disease, unspecified: J44.9

## 2011-08-18 LAB — BASIC METABOLIC PANEL
CO2: 24 mEq/L (ref 19–32)
Calcium: 9.2 mg/dL (ref 8.4–10.5)
Chloride: 100 mEq/L (ref 96–112)
Glucose, Bld: 137 mg/dL — ABNORMAL HIGH (ref 70–99)
Potassium: 3.2 mEq/L — ABNORMAL LOW (ref 3.5–5.1)
Sodium: 137 mEq/L (ref 135–145)

## 2011-08-18 LAB — CBC
MCV: 85 fL (ref 78.0–100.0)
Platelets: 208 10*3/uL (ref 150–400)
RBC: 4.86 MIL/uL (ref 4.22–5.81)
RDW: 14 % (ref 11.5–15.5)
WBC: 7.1 10*3/uL (ref 4.0–10.5)

## 2011-08-18 LAB — DIFFERENTIAL
Basophils Absolute: 0 10*3/uL (ref 0.0–0.1)
Eosinophils Relative: 1 % (ref 0–5)
Lymphocytes Relative: 28 % (ref 12–46)
Lymphs Abs: 2 10*3/uL (ref 0.7–4.0)
Neutro Abs: 4.4 10*3/uL (ref 1.7–7.7)
Neutrophils Relative %: 62 % (ref 43–77)

## 2011-08-18 LAB — POCT I-STAT TROPONIN I: Troponin i, poc: 0 ng/mL (ref 0.00–0.08)

## 2011-08-18 LAB — PRO B NATRIURETIC PEPTIDE: Pro B Natriuretic peptide (BNP): 23.7 pg/mL (ref 0–125)

## 2011-08-18 MED ORDER — POTASSIUM CHLORIDE CRYS ER 20 MEQ PO TBCR
40.0000 meq | EXTENDED_RELEASE_TABLET | Freq: Once | ORAL | Status: AC
  Administered 2011-08-18: 40 meq via ORAL
  Filled 2011-08-18: qty 2

## 2011-08-18 MED ORDER — ALBUTEROL SULFATE (5 MG/ML) 0.5% IN NEBU
2.5000 mg | INHALATION_SOLUTION | Freq: Once | RESPIRATORY_TRACT | Status: AC
Start: 2011-08-18 — End: 2011-08-18
  Administered 2011-08-18: 2.5 mg via RESPIRATORY_TRACT
  Filled 2011-08-18: qty 1

## 2011-08-18 NOTE — ED Provider Notes (Signed)
Medical screening examination/treatment/procedure(s) were conducted as a shared visit with non-physician practitioner(s) and myself.  I personally evaluated the patient during the encounter.  I saw the patient along with B. Laveda Norman.  He presents with episodes of palpitations, shortness of breath over the past two days.  This morning was walking to the car when it occurred again.  On exam, he is obese, the heart and lungs are unremarkable, and he is neurologically intact.  The workup returned unremarkable and he was feeling better.  There were no arrhythmias.  He will be discharged to home and be given the number for cardiology to arranged follow up.  Geoffery Lyons, MD 08/18/11 [phone]

## 2011-08-18 NOTE — ED Provider Notes (Signed)
History     CSN: [id number]  Arrival date & time 08/18/11  0209   First MD Initiated Contact with Patient 08/18/11 [phone]      Chief Complaint  Patient presents with  . Shortness of Breath  . COPD    (Consider location/radiation/quality/duration/timing/severity/associated sxs/prior treatment) HPI Comments: Patient is a current smoker with a history of diabetes, hypertension, hyperlipidemia, and COPD presents emergency department with chief complaint of worsening shortness of breath and chest pain. Onset of symptoms was about 9 days ago and has progressed. Patient states he is now unable to walk to his car without becoming SOB. He describes CP as a pressure feeling located substernal, does not radiate, and is a 5/10 in severity.  patient was evaluated for the same symptoms in the emergency department on 08/06/2011 and 08/10/2011. Pt has had cardiology work up x 2 with negative troponin, CXR and BNP WNL. Pt also had a CT angio chest that was neg for PE & CT head for dizziness that was negative for acute findings. Pt is not on home oxygen.   The history is provided by the patient.    Past Medical History  Diagnosis Date  . Diabetes mellitus   . Hypertension   . High cholesterol   . COPD (chronic obstructive pulmonary disease)     Past Surgical History  Procedure Date  . Shoulder surgery     right    No family history on file.  History  Substance Use Topics  . Smoking status: Current Everyday Smoker -- 0.5 packs/day  . Smokeless tobacco: Never Used  . Alcohol Use: Yes     socially      Review of Systems  Constitutional: Positive for fatigue. Negative for fever, chills and appetite change.  HENT: Negative for congestion, neck stiffness and ear discharge.   Eyes: Negative for visual disturbance.  Respiratory: Positive for chest tightness and shortness of breath. Negative for cough, wheezing and stridor.   Cardiovascular: Positive for chest pain and leg swelling. Negative for  palpitations.  Gastrointestinal: Negative for nausea, vomiting, abdominal pain and constipation.  Genitourinary: Negative for dysuria, urgency and frequency.  Neurological: Negative for dizziness, syncope, weakness, light-headedness, numbness and headaches.  Psychiatric/Behavioral: Negative for confusion.  All other systems reviewed and are negative.    Allergies  Review of patient's allergies indicates no known allergies.  Home Medications   Current Outpatient Rx  Name Route Sig Dispense Refill  . ALBUTEROL SULFATE HFA 108 (90 BASE) MCG/ACT IN AERS Inhalation Inhale 2 puffs into the lungs every 6 (six) hours as needed. Shortness of breath    . INSULIN GLARGINE 100 UNIT/ML [place] SOLN Subcutaneous Inject 55 Units into the skin at bedtime.    Marland Kitchen LISINOPRIL 10 MG PO TABS Oral Take 10 mg by mouth daily.    Marland Kitchen LORAZEPAM 1 MG PO TABS Oral Take 1 mg by mouth 2 (two) times daily.    Marland Kitchen METFORMIN HCL 500 MG PO TABS Oral Take 1,500 mg by mouth daily with breakfast.    . SIMVASTATIN 20 MG PO TABS Oral Take 20 mg by mouth daily.      BP 137/90  Pulse 116  Temp(Src) 98 F (36.7 C) (Oral)  Resp 16  SpO2 98%  Physical Exam  Nursing note and vitals reviewed. Constitutional: He is oriented to person, place, and time. He appears well-developed and well-nourished. No distress.       Tachycardic, tachypnec, hypertensive. Pulse ox 98 on 2LNC  HENT:  Head:  Normocephalic and atraumatic.       Uvula midline, oropharynx clear and moist  Eyes: Conjunctivae and EOM are normal. Pupils are equal, round, and reactive to light.  Neck: Normal range of motion. Neck supple. Normal carotid pulses and no JVD present. Carotid bruit is not present. No rigidity. Normal range of motion present.       No stridor.  Neck supple with full range of motion.  Cardiovascular: Regular rhythm, S1 normal, S2 normal, normal heart sounds, intact distal pulses and normal pulses.  Exam reveals no gallop and no friction rub.   No  murmur heard.      Tachycardic,regular rhythm, no aberrant sounds on auscultations, distal pulses intact, no carotid bruit or JVD. Bilateral lower leg swelling, non pitting edema  Pulmonary/Chest: Effort normal. No accessory muscle usage or stridor. No respiratory distress. He has no wheezes. He exhibits no tenderness and no bony tenderness.       Patient not in respiratory distress, no accessory muscle use, nasal flaring.  Patient able to speak in full sentences.  Airway intact. LCAB  Abdominal: Soft. Bowel sounds are normal. There is no tenderness.       Morbidly obese soft non tender. Non pulsatile aorta.   Musculoskeletal: Normal range of motion. He exhibits no edema and no tenderness.  Neurological: He is alert and oriented to person, place, and time.       CN III-VII intact, no abnormal coordination  Skin: Skin is warm, dry and intact. No rash noted. He is not diaphoretic. No cyanosis. Nails show no clubbing.  Psychiatric: He has a normal mood and affect. His behavior is normal.    ED Course  Procedures (including critical care time)  Labs Reviewed - No data to display No results found.   No diagnosis found.  . Date: 08/18/2011  Rate: 86  Rhythm: normal sinus rhythm  QRS Axis: normal  Intervals: normal  ST/T Wave abnormalities: Nonspecific t wave inversions III On old  Conduction Disutrbances:none  Narrative Interpretation:   Old EKG Reviewed: changes noted     MDM  SOB Chest pain Hypertensive  Pt care resumed by Fayrene Helper, PA-C     Jaci Carrel, PA-C 08/19/11 [phone]

## 2011-08-18 NOTE — ED Notes (Signed)
Pt states he was on his way to work and stepped out of the house and couldn't catch his breath. R 30/min. O2 100% RA. Hx of COPD.

## 2011-08-18 NOTE — ED Provider Notes (Signed)
Patient seen along with Paz/Tran.  I agree with their notes.  He seems fine and heart and lung exam is unremarkable.  No evidence of cardiac injury or ischemia.  We will suggest he follow up with cardiology to discuss stress test or even possible 30 day event monitor.    Geoffery Lyons, MD 08/18/11 [phone]

## 2011-08-18 NOTE — ED Provider Notes (Signed)
Assumed pt care @ [phone].  46 yo M, hx of COPD presents with SOB and CP.  Had cardiac r/o recently.  Had negative CT angio.  Worsening SOB.  Need to walk and monitor.  Third visits in 1 month for same.    Plan to reevaluate.    7:27 AM On evaluation patient appears to be in no acute distress. He is in no acute respiratory distress. Lungs clear to auscultations bilaterally. He does complain of some mild chest pressure to his left chest which is constant and improving. His current vital signs stable. He is afebrile. He has a mild hypokalemia with a potassium of 3.2. Potassium supplementation given. His BNP is unremarkable. Chest x-ray shows no evidence of any acute pathology. He has normal saturations with ambulation. He has no prior provocative cardiac testing.  My attending has seen and evaluated the patient felt that he is stable to be discharged. I recommend for patient to follow up with cardiology, as he may benefit from 30 days cardiac monitoring to rule out arrhythmia.  His EKG today is unremarkable.  Fayrene Helper, PA-C 08/18/11 [phone]

## 2011-08-18 NOTE — ED Notes (Signed)
Patient ambulated with O2 monitor. Remained greater than 97% while ambulating in hallway. Tolerated well.

## 2011-08-18 NOTE — Discharge Instructions (Signed)
Please followup with your primary care provider for reevaluation of your symptoms. You may also follow up with cardiology as well. You may benefit from continuous heart monitoring to rule out arrhythmia and for further evaluation.  Shortness of Breath Shortness of breath (dyspnea) is the feeling of uneasy breathing. Shortness of breath does not always mean that there is a life-threatening illness. However, shortness of breath requires immediate medical care. CAUSES  Causes for shortness of breath include:  Not enough oxygen in the air (as with high altitudes or with a smoke-filled room).   Short-term (acute) lung disease, including:   Infections such as pneumonia.   Fluid in the lungs, such as heart failure.   A blood clot in the lungs (pulmonary embolism).   Lasting (chronic) lung diseases.   Heart disease (heart attack, angina, heart failure, and others).   Low red blood cells (anemia).   Poor physical fitness. This can cause shortness of breath when you exercise.   Chest or back injuries or stiffness.   Being overweight (obese).   Anxiety. This can make you feel like you are not getting enough air.  DIAGNOSIS  Serious medical problems can usually be found during your physical exam. Many tests may also be done to determine why you are having shortness of breath. Tests include:  Chest X-rays.   Lung function tests.   Blood tests.   Electrocardiography.   Exercise testing.   A cardiac echo.   Imaging scans.  Your caregiver may not be able to find a cause for your shortness of breath after your exam. In this case, it is important to have a follow-up exam with your caregiver as directed.  HOME CARE INSTRUCTIONS   Do not smoke. Smoking is a common cause of shortness of breath. Ask for help to stop smoking.   Avoid being around chemicals that may bother your breathing (paint fumes, dust).   Rest as needed. Slowly resume your usual activities.   If medicines were  prescribed, take them as directed for the full length of time directed. This includes oxygen and any inhaled medicines.   Follow up with your caregiver as directed. Waiting to do so or failure to follow up could result in worsening of your condition and possible disability or death.   Be sure you understand what to do or who to call if your shortness of breath worsens.  SEEK MEDICAL CARE IF:   Your condition does not improve in the time expected.   You have a hard time doing your normal activities even with rest.   You have any side effects or problems with the medicines prescribed.   You develop any new symptoms.  SEEK IMMEDIATE MEDICAL CARE IF:   Your shortness of breath is getting worse.   You feel lightheaded, faint, or develop a cough not controlled with medicines.   You start coughing up blood.   You have pain with breathing.   You have chest pain or pain in your arms, shoulders, or abdomen.   You have a fever.   You are unable to walk up stairs or exercise the way you normally do.   Your symptoms are getting worse.  Document Released: 02/08/2001 Document Revised: 05/05/2011 Document Reviewed: 09/26/2007 [facility] Patient Information 2012 [place], [place].

## 2011-08-20 NOTE — ED Provider Notes (Signed)
Medical screening examination/treatment/procedure(s) were conducted as a shared visit with non-physician practitioner(s) and myself.  I personally evaluated the patient during the encounter.  I saw the patient along with L. Paz.  The patient presents complaining of palpitations and shortness of breath that began while walking to his car today.  He feels fine now.  He has had similar episodes over the past several weeks.  On exam, the heart and lungs are normal.  The abdomen is benign, however the patient is obese.  The workup reveals a normal ekg and when seen by me, his oxygen saturations were in the upper 90's and was in no acute distress.  The cardiac enzyme and ekg were unremarkable.  He will be discharged with follow up with cardiology to discuss either stress testing or event monitoring.  Geoffery Lyons, MD 08/20/11 [phone]

## 2011-09-02 ENCOUNTER — Encounter: Payer: Self-pay | Admitting: Cardiology

## 2015-01-21 ENCOUNTER — Other Ambulatory Visit: Payer: Self-pay | Admitting: Occupational Medicine

## 2015-01-21 ENCOUNTER — Ambulatory Visit: Payer: Self-pay

## 2015-01-21 DIAGNOSIS — M79641 Pain in right hand: Secondary | ICD-10-CM

## 2015-03-19 DIAGNOSIS — K439 Ventral hernia without obstruction or gangrene: Secondary | ICD-10-CM | POA: Insufficient documentation

## 2015-06-28 ENCOUNTER — Encounter (HOSPITAL_COMMUNITY): Payer: Self-pay | Admitting: Oncology

## 2015-06-28 ENCOUNTER — Emergency Department (HOSPITAL_COMMUNITY): Payer: Self-pay

## 2015-06-28 ENCOUNTER — Emergency Department (HOSPITAL_COMMUNITY)
Admission: EM | Admit: 2015-06-28 | Discharge: 2015-06-28 | Disposition: A | Discharge status: Home / Self Care | Payer: Self-pay | Attending: Emergency Medicine | Admitting: Emergency Medicine

## 2015-06-28 DIAGNOSIS — F17200 Nicotine dependence, unspecified, uncomplicated: Secondary | ICD-10-CM | POA: Insufficient documentation

## 2015-06-28 DIAGNOSIS — R079 Chest pain, unspecified: Secondary | ICD-10-CM | POA: Insufficient documentation

## 2015-06-28 DIAGNOSIS — Z7984 Long term (current) use of oral hypoglycemic drugs: Secondary | ICD-10-CM | POA: Insufficient documentation

## 2015-06-28 DIAGNOSIS — Z79899 Other long term (current) drug therapy: Secondary | ICD-10-CM | POA: Insufficient documentation

## 2015-06-28 DIAGNOSIS — E7800 Pure hypercholesterolemia, unspecified: Secondary | ICD-10-CM | POA: Insufficient documentation

## 2015-06-28 DIAGNOSIS — E119 Type 2 diabetes mellitus without complications: Secondary | ICD-10-CM | POA: Insufficient documentation

## 2015-06-28 DIAGNOSIS — Z794 Long term (current) use of insulin: Secondary | ICD-10-CM | POA: Insufficient documentation

## 2015-06-28 DIAGNOSIS — I10 Essential (primary) hypertension: Secondary | ICD-10-CM | POA: Insufficient documentation

## 2015-06-28 DIAGNOSIS — J441 Chronic obstructive pulmonary disease with (acute) exacerbation: Secondary | ICD-10-CM | POA: Insufficient documentation

## 2015-06-28 LAB — CBC WITH DIFFERENTIAL/PLATELET
BASOS ABS: 0 10*3/uL (ref 0.0–0.1)
BASOS PCT: 0 %
EOS ABS: 0.1 10*3/uL (ref 0.0–0.7)
EOS PCT: 1 %
HCT: 46 % (ref 39.0–52.0)
Hemoglobin: 15.5 g/dL (ref 13.0–17.0)
Lymphocytes Relative: 38 %
Lymphs Abs: 3.6 10*3/uL (ref 0.7–4.0)
MCH: 29.4 pg (ref 26.0–34.0)
MCHC: 33.7 g/dL (ref 30.0–36.0)
MCV: 87.1 fL (ref 78.0–100.0)
MONO ABS: 0.7 10*3/uL (ref 0.1–1.0)
Monocytes Relative: 7 %
Neutro Abs: 5.1 10*3/uL (ref 1.7–7.7)
Neutrophils Relative %: 54 %
PLATELETS: 212 10*3/uL (ref 150–400)
RBC: 5.28 MIL/uL (ref 4.22–5.81)
RDW: 15 % (ref 11.5–15.5)
WBC: 9.6 10*3/uL (ref 4.0–10.5)

## 2015-06-28 LAB — COMPREHENSIVE METABOLIC PANEL
ALBUMIN: 3.9 g/dL (ref 3.5–5.0)
ALK PHOS: 80 U/L (ref 38–126)
ALT: 36 U/L (ref 17–63)
AST: 28 U/L (ref 15–41)
Anion gap: 12 (ref 5–15)
BILIRUBIN TOTAL: 0.6 mg/dL (ref 0.3–1.2)
BUN: 12 mg/dL (ref 6–20)
CALCIUM: 8.9 mg/dL (ref 8.9–10.3)
CO2: 21 mmol/L — ABNORMAL LOW (ref 22–32)
CREATININE: 0.97 mg/dL (ref 0.61–1.24)
Chloride: 104 mmol/L (ref 101–111)
GFR calc Af Amer: >60 mL/min (ref >60)
GLUCOSE: 168 mg/dL — AB (ref 65–99)
Potassium: 3.8 mmol/L (ref 3.5–5.1)
Sodium: 137 mmol/L (ref 135–145)
TOTAL PROTEIN: 7.9 g/dL (ref 6.5–8.1)

## 2015-06-28 LAB — TROPONIN I

## 2015-06-28 LAB — BRAIN NATRIURETIC PEPTIDE: B NATRIURETIC PEPTIDE 5: 1 pg/mL (ref 0.0–100.0)

## 2015-06-28 NOTE — ED Provider Notes (Signed)
CSN: [id number]     Arrival date & time 06/28/15  0234 History   By signing my name below, I, Forrestine Him, attest that this documentation has been prepared under the direction and in the presence of Leahna Hewson PA-C.  Electronically Signed: Forrestine Him, ED Scribe. 06/28/2015. 3:08 AM.   No chief complaint on file.  The history is provided by the patient. No language interpreter was used.    HPI Comments: Jeremy Clark is a 50 y.o. male with a PMHx of DM, HTN, COPD, and hyperlipidemia who presents to the Emergency Department complaining of intermittent, ongoing, central chest pain x 5 hours that started around 10:00 pm while at rest. He went to bed to try to sleep it off and was there when he woke up around 1:00 or 2:00, prompting ED evaluation. Pain is described as pressure and rated 8/10 at highest intensity, but currently rated 3/10. Discomfort is exacerbated with movement/when supine and mildly alleviated when sitting still. Pt also reports shortness of breath and mild paresthesias to the fingertips of both hands. No recent fever, chills, cough, nausea, vomiting, abdominal pain, or leg swelling. No OTC medications or home remedies attempted prior to arrival. Mr. Oliva admits to a history of same approximately 6 years ago associated with sleep apnea. No recent surgeries. No recent long distance travel. Pt denies any illicit drug use or smoking habits. No prior history of blood clots. He denies any prior history of stent placements. Pt admits his uncle had a stroke approximately 6 years ago but denies any other family history.  PCP: PROVIDER NOT IN SYSTEM    Past Medical History  Diagnosis Date  . Diabetes mellitus   . Hypertension   . High cholesterol   . COPD (chronic obstructive pulmonary disease) [facility])    Past Surgical History  Procedure Laterality Date  . Shoulder surgery      right   History reviewed. No pertinent family history. Social History  Substance Use Topics  .  Smoking status: Current Every Day Smoker -- 0.50 packs/day  . Smokeless tobacco: Never Used  . Alcohol Use: Yes     Comment: socially    Review of Systems  Constitutional: Negative for fever, chills and diaphoresis.  Respiratory: Positive for shortness of breath.   Cardiovascular: Positive for chest pain.  Gastrointestinal: Negative for nausea and vomiting.  Neurological: Negative for headaches.  Psychiatric/Behavioral: Negative for confusion.  All other systems reviewed and are negative.     Allergies  Review of patient's allergies indicates no known allergies.  Home Medications   Prior to Admission medications   Medication Sig Start Date End Date Taking? Authorizing Provider  albuterol (PROVENTIL HFA;VENTOLIN HFA) 108 (90 BASE) MCG/ACT inhaler Inhale 2 puffs into the lungs every 6 (six) hours as needed. Shortness of breath    Historical Provider, MD  insulin glargine (LANTUS) 100 UNIT/ML injection Inject 55 Units into the skin at bedtime.    Historical Provider, MD  lisinopril (PRINIVIL,ZESTRIL) 10 MG tablet Take 10 mg by mouth daily.    Historical Provider, MD  LORazepam (ATIVAN) 1 MG tablet Take 1 mg by mouth 2 (two) times daily.    Historical Provider, MD  metFORMIN (GLUCOPHAGE) 500 MG tablet Take 1,500 mg by mouth daily with breakfast.    Historical Provider, MD  simvastatin (ZOCOR) 20 MG tablet Take 20 mg by mouth daily.    Historical Provider, MD   Triage Vitals: BP 138/83 mmHg  Pulse 97  Temp(Src) 97.7 F (  36.5 C) (Oral)  Resp 20  Ht 5\' 4"  (1.626 m)  Wt 354 lb (160.573 kg)  BMI 60.73 kg/m2  SpO2 100%   Physical Exam  Constitutional: He is oriented to person, place, and time. He appears well-developed and well-nourished.  Morbidly obese patient.  HENT:  Head: Normocephalic and atraumatic.  Eyes: EOM are normal.  Neck: Normal range of motion. Neck supple.  Cardiovascular: Normal rate, regular rhythm, normal heart sounds and intact distal pulses.   No murmur  heard. Pulmonary/Chest: Effort normal and breath sounds normal. No respiratory distress. He has no wheezes. He has no rales. He exhibits tenderness.  Abdominal: Soft. He exhibits no distension. There is no tenderness.  Musculoskeletal: Normal range of motion. He exhibits no edema.  Neurological: He is alert and oriented to person, place, and time.  Skin: Skin is warm and dry.  Psychiatric: He has a normal mood and affect. Judgment normal.  Nursing note and vitals reviewed.   ED Course  Procedures   DIAGNOSTIC STUDIES: Oxygen Saturation is 100% on RA, Normal by my interpretation.    COORDINATION OF CARE: 3:04 AM-Discussed treatment plan with pt at bedside and pt agreed to plan.     Labs Review Labs Reviewed - No data to display  Imaging Review No results found. I have personally reviewed and evaluated these images and lab results as part of my medical decision-making.   EKG Interpretation None      MDM   Final diagnoses:  None    1. Chest pain  Pain is reproducible. The patient reports he has been more active than usual that might affect chest and upper extremities. He does have significant risk factors of DM, HTN, HLD. Initial lab, EKG and CXR studies normal with troponin of 0.03. Delta troponin scheduled for 6:30 am. Patient care signed out at end of shift to [facility], PA-C, pending result of delta trop.  I personally performed the services described in this documentation, which was scribed in my presence. The recorded information has been reviewed and is accurate.     Charlann Lange, PA-C 06/28/15 [place], MD 06/28/15 [phone]

## 2015-06-28 NOTE — ED Provider Notes (Signed)
  Physical Exam  BP 138/83 mmHg  Pulse 97  Temp(Src) 97.7 F (36.5 C) (Oral)  Resp 20  Ht 5\' 4"  (1.626 m)  Wt 160.573 kg  BMI 60.73 kg/m2  SpO2 100%  Physical Exam  ED Course  Procedures  MDM Chest Pain Risk factors of DM, HTN, HLD Neg Trop, Neg EKG, Neg CXR Delta Trop DC if Negative  2nd Trop negative.        Shary Decamp, PA-C 06/28/15 0745  Dorie Rank, MD 06/28/15 [phone]

## 2015-06-28 NOTE — ED Notes (Signed)
Pt states that he went to bed with slight chest pain but he thought he just needed to sleep it off, then he said he woke up in the night feeling worse, he states it feels like something is pushing down on his chest. Pt is having a hard time talking and catching his breath

## 2015-06-28 NOTE — Discharge Instructions (Signed)

## 2018-08-13 ENCOUNTER — Ambulatory Visit: Payer: Self-pay | Admitting: Family Medicine

## 2018-08-24 ENCOUNTER — Ambulatory Visit: Payer: Self-pay | Admitting: Family Medicine

## 2018-10-15 ENCOUNTER — Encounter: Payer: Self-pay | Admitting: Family Medicine

## 2018-10-15 ENCOUNTER — Other Ambulatory Visit: Payer: Self-pay

## 2018-10-15 ENCOUNTER — Telehealth: Payer: Self-pay | Admitting: *Deleted

## 2018-10-15 ENCOUNTER — Ambulatory Visit (INDEPENDENT_AMBULATORY_CARE_PROVIDER_SITE_OTHER): Payer: Commercial Managed Care - PPO | Admitting: Family Medicine

## 2018-10-15 VITALS — Ht 64.0 in | Wt 325.0 lb

## 2018-10-15 DIAGNOSIS — R519 Headache, unspecified: Secondary | ICD-10-CM

## 2018-10-15 DIAGNOSIS — I10 Essential (primary) hypertension: Secondary | ICD-10-CM

## 2018-10-15 DIAGNOSIS — E785 Hyperlipidemia, unspecified: Secondary | ICD-10-CM

## 2018-10-15 DIAGNOSIS — R51 Headache: Secondary | ICD-10-CM | POA: Diagnosis not present

## 2018-10-15 DIAGNOSIS — E118 Type 2 diabetes mellitus with unspecified complications: Secondary | ICD-10-CM

## 2018-10-15 DIAGNOSIS — Z794 Long term (current) use of insulin: Secondary | ICD-10-CM

## 2018-10-15 DIAGNOSIS — E119 Type 2 diabetes mellitus without complications: Secondary | ICD-10-CM | POA: Insufficient documentation

## 2018-10-15 NOTE — Telephone Encounter (Signed)
LVM FOR PT TO CALL AND SCHEDULE FOLLOW UP

## 2018-10-15 NOTE — Progress Notes (Signed)
Chief Complaint  Patient presents with  . New Patient (Initial Visit)       New Patient Visit SUBJECTIVE: HPI: Jeremy Clark is an 53 y.o.male who is being seen for establishing care.  Due to COVID-19 pandemic, we are interacting via telephone. I verified patient's ID using 2 identifiers. Patient agreed to proceed with visit via this method. Patient is at home, I am at office. Patient and I are present for visit.   DM  Toujeo 72 u/night. 07/2018, A1c 6.5. Diet is going well. Has been walking. Cks sugar every other day. Runs in 90's - low 100's. No low's. Last eye exam was 4 months ago. Is on statin. PCV23 was roughly 2 years ago.   Hypertension Patient presents for hypertension follow up. He does monitor home blood pressures. Blood pressures ranging on average from 120's/70's. He is compliant with medication- lisinopril 30 mg/d. Patient has these side effects of medication: none He is adhering to a healthy diet overall. Exercise: some walking  Over past week has been having HA's. 3 people close to him passed away recently. His wife passed away in Jul 07, 2022 of this year, they had been married for 31 yrs. Their anniversary was recently and Mother's day was also particularly hard. No other neurologic s/s's.    No Known Allergies  Past Medical History:  Diagnosis Date  . Diabetes mellitus   . High cholesterol   . Hypertension    Past Surgical History:  Procedure Laterality Date  . SHOULDER SURGERY     right   Family History  Problem Relation Age of Onset  . Hypertension Mother   . Cancer Father        prostate; 45 at age of dx   No Known Allergies  Current Outpatient Medications:  .  busPIRone (BUSPAR) 10 MG tablet, Take 10 mg by mouth 2 (two) times daily., Disp: , Rfl:  .  Canagliflozin-Metformin HCl (INVOKAMET) [phone] MG TABS, Take 1 tablet by mouth 2 (two) times daily., Disp: , Rfl:  .  Insulin Glargine (TOUJEO SOLOSTAR) 300 UNIT/ML SOPN, Inject 72 Units into the skin  daily., Disp: , Rfl:  .  lisinopril (PRINIVIL,ZESTRIL) 30 MG tablet, Take 30 mg by mouth daily., Disp: , Rfl:  .  simvastatin (ZOCOR) 20 MG tablet, Take 20 mg by mouth daily., Disp: , Rfl:   ROS Cardiovascular: Denies chest pain  Respiratory: Denies dyspnea   OBJECTIVE: No conversational dyspnea Age appropriate judgment and insight Nml affect and mood  ASSESSMENT/PLAN: Controlled type 2 diabetes mellitus with complication, with long-term current use of insulin (HCC)  Essential hypertension  Hyperlipidemia, unspecified hyperlipidemia type  Acute nonintractable headache, unspecified headache type  Cont current meds.  For HA, will send stretches/exercises for neck. Sounds like it is more related to stress, which he agrees with. If no improvement over next couple weeks, I will plan to see him in the office.  Total time spent: 12 min Patient should return in 4 mo for DM visit otherwise. The patient voiced understanding and agreement to the plan.   [place], DO 10/15/18  10:56 AM

## 2019-06-05 ENCOUNTER — Ambulatory Visit: Payer: 59 | Attending: Internal Medicine

## 2019-06-05 DIAGNOSIS — Z20822 Contact with and (suspected) exposure to covid-19: Secondary | ICD-10-CM

## 2019-06-07 LAB — NOVEL CORONAVIRUS, NAA: SARS-CoV-2, NAA: NOT DETECTED

## 2019-11-08 DIAGNOSIS — E559 Vitamin D deficiency, unspecified: Secondary | ICD-10-CM | POA: Insufficient documentation

## 2019-11-18 ENCOUNTER — Other Ambulatory Visit (HOSPITAL_COMMUNITY): Payer: 59

## 2019-11-21 ENCOUNTER — Other Ambulatory Visit: Payer: Self-pay

## 2019-11-21 ENCOUNTER — Encounter (HOSPITAL_COMMUNITY): Payer: Self-pay

## 2019-11-21 ENCOUNTER — Emergency Department (HOSPITAL_COMMUNITY)
Admission: EM | Admit: 2019-11-21 | Discharge: 2019-11-21 | Disposition: A | Discharge status: Home / Self Care | Payer: 59 | Attending: Emergency Medicine | Admitting: Emergency Medicine

## 2019-11-21 DIAGNOSIS — E119 Type 2 diabetes mellitus without complications: Secondary | ICD-10-CM | POA: Diagnosis not present

## 2019-11-21 DIAGNOSIS — R109 Unspecified abdominal pain: Secondary | ICD-10-CM | POA: Diagnosis present

## 2019-11-21 DIAGNOSIS — I10 Essential (primary) hypertension: Secondary | ICD-10-CM | POA: Insufficient documentation

## 2019-11-21 DIAGNOSIS — Z7984 Long term (current) use of oral hypoglycemic drugs: Secondary | ICD-10-CM | POA: Diagnosis not present

## 2019-11-21 DIAGNOSIS — R350 Frequency of micturition: Secondary | ICD-10-CM | POA: Diagnosis not present

## 2019-11-21 DIAGNOSIS — Z87891 Personal history of nicotine dependence: Secondary | ICD-10-CM | POA: Diagnosis not present

## 2019-11-21 DIAGNOSIS — Z79899 Other long term (current) drug therapy: Secondary | ICD-10-CM | POA: Diagnosis not present

## 2019-11-21 LAB — URINALYSIS, ROUTINE W REFLEX MICROSCOPIC
Bilirubin Urine: NEGATIVE
Glucose, UA: >=500 mg/dL — AB
Hgb urine dipstick: NEGATIVE
Ketones, ur: NEGATIVE mg/dL
Leukocytes,Ua: NEGATIVE
Nitrite: NEGATIVE
Protein, ur: NEGATIVE mg/dL
Specific Gravity, Urine: 1.027 (ref 1.005–1.030)
pH: 6 (ref 5.0–8.0)

## 2019-11-21 NOTE — ED Notes (Signed)
Per [place], EDP ok for patient to discharge even though his blood work has not been done.

## 2019-11-21 NOTE — ED Triage Notes (Signed)
Pt states he felt like he had to have  BM and then started shaking and became lightheaded, he was unable to use the bathroom Pt states his urologist is suppose to be scheduling and MRI

## 2019-11-22 LAB — URINE CULTURE: Culture: <10000 — AB

## 2019-11-22 NOTE — ED Provider Notes (Signed)
[facility] DEPT Provider Note   CSN: [id number] Arrival date & time: 11/21/19  7106     History No chief complaint on file.   Jeremy Clark is a 54 y.o. male.   Urinary Frequency This is a new problem. The current episode started more than 2 days ago. The problem occurs constantly. The problem has been gradually worsening. Associated symptoms include abdominal pain. Pertinent negatives include no chest pain, no headaches and no shortness of breath. Nothing aggravates the symptoms. Nothing relieves the symptoms. He has tried nothing for the symptoms.       Past Medical History:  Diagnosis Date  . Diabetes mellitus   . High cholesterol   . Hypertension     Patient Active Problem List   Diagnosis Date Noted  . Controlled type 2 diabetes mellitus with complication, with long-term current use of insulin ([place]) 10/15/2018  . Essential hypertension 10/15/2018  . Hyperlipidemia 10/15/2018    Past Surgical History:  Procedure Laterality Date  . SHOULDER SURGERY     right       Family History  Problem Relation Age of Onset  . Hypertension Mother   . Cancer Father        prostate; 72 at age of dx    Social History   Tobacco Use  . Smoking status: Former Smoker    Packs/day: 0.50    Quit date: 05/31/2003    Years since quitting: 16.4  . Smokeless tobacco: Never Used  Substance Use Topics  . Alcohol use: Not Currently  . Drug use: No    Home Medications Prior to Admission medications   Medication Sig Start Date End Date Taking? Authorizing Provider  busPIRone (BUSPAR) 10 MG tablet Take 10 mg by mouth 2 (two) times daily.    [provider]  Canagliflozin-Metformin HCl (INVOKAMET) [phone] MG TABS Take 1 tablet by mouth 2 (two) times daily.    [provider]  Insulin Glargine (TOUJEO SOLOSTAR) 300 UNIT/ML SOPN Inject 72 Units into the skin daily.    [provider]  lisinopril (PRINIVIL,ZESTRIL) 30 MG  tablet Take 30 mg by mouth daily.    [provider]  simvastatin (ZOCOR) 20 MG tablet Take 20 mg by mouth daily.    [provider]    Allergies    Patient has no known allergies.  Review of Systems   Review of Systems  Respiratory: Negative for shortness of breath.   Cardiovascular: Negative for chest pain.  Gastrointestinal: Positive for abdominal pain.  Genitourinary: Positive for frequency.  Neurological: Negative for headaches.  All other systems reviewed and are negative.   Physical Exam Updated Vital Signs BP (!) 152/93 (BP Location: Right Arm)   Pulse 65   Temp 98.1 F (36.7 C) (Oral)   Resp 16   Ht 5\' 4"  (1.626 m)   Wt 131.5 kg   SpO2 98%   BMI 49.78 kg/m   Physical Exam Vitals and nursing note reviewed.  Constitutional:      Appearance: He is well-developed.  HENT:     Head: Normocephalic and atraumatic.     Mouth/Throat:     Mouth: Mucous membranes are dry.     Pharynx: Oropharynx is clear.  Eyes:     Conjunctiva/sclera: Conjunctivae normal.     Pupils: Pupils are equal, round, and reactive to light.  Cardiovascular:     Rate and Rhythm: Normal rate.  Pulmonary:     Effort: Pulmonary effort is normal. No  respiratory distress.  Abdominal:     General: There is no distension.  Musculoskeletal:        General: Normal range of motion.     Cervical back: Normal range of motion.  Skin:    General: Skin is warm and dry.  Neurological:     General: No focal deficit present.     Mental Status: He is alert.     ED Results / Procedures / Treatments   Labs (all labs ordered are listed, but only abnormal results are displayed) Labs Reviewed  URINALYSIS, ROUTINE W REFLEX MICROSCOPIC - Abnormal; Notable for the following components:      Result Value   Glucose, UA >=500 (*)    Bacteria, UA RARE (*)    All other components within normal limits  URINE CULTURE    EKG None  Radiology No results found.  Procedures Procedures  (including critical care time)  Medications Ordered in ED Medications - No data to display  ED Course  I have reviewed the triage vital signs and the nursing notes.  Pertinent labs & imaging results that were available during my care of the patient were reviewed by me and considered in my medical decision making (see chart for details).    MDM Rules/Calculators/A&P                          Patient with some shaking while having a bowel movement subsequently resolved.  Also with increased urinary frequency and urge as well.  Some burning when he urinates but not consistently.  Some suprapubic abdominal pain but not consistently.  Postvoid residual showed only 93-96 in his bladder.  No infection.  No evidence of hematuria to suggest kidney stone or other abnormality.  We will have him follow-up with urology as scheduled.  No other indication for work-up or hospitalization at this time.  Final Clinical Impression(s) / ED Diagnoses Final diagnoses:  Increased urinary frequency    Rx / DC Orders ED Discharge Orders    None       Tania Steinhauser, Corene Cornea, MD 11/22/19 [phone]

## 2020-01-01 DIAGNOSIS — C61 Malignant neoplasm of prostate: Secondary | ICD-10-CM | POA: Insufficient documentation

## 2020-01-01 DIAGNOSIS — N4289 Other specified disorders of prostate: Secondary | ICD-10-CM | POA: Insufficient documentation

## 2020-01-01 DIAGNOSIS — R972 Elevated prostate specific antigen [PSA]: Secondary | ICD-10-CM | POA: Insufficient documentation

## 2020-01-14 ENCOUNTER — Encounter: Payer: Self-pay | Admitting: Podiatry

## 2020-01-14 ENCOUNTER — Other Ambulatory Visit: Payer: Self-pay

## 2020-01-14 ENCOUNTER — Ambulatory Visit (INDEPENDENT_AMBULATORY_CARE_PROVIDER_SITE_OTHER): Payer: 59 | Admitting: Podiatry

## 2020-01-14 VITALS — BP 123/65 | HR 80 | Temp 97.7°F | Resp 16

## 2020-01-14 DIAGNOSIS — M79674 Pain in right toe(s): Secondary | ICD-10-CM

## 2020-01-14 DIAGNOSIS — M79675 Pain in left toe(s): Secondary | ICD-10-CM

## 2020-01-14 DIAGNOSIS — B351 Tinea unguium: Secondary | ICD-10-CM | POA: Diagnosis not present

## 2020-01-14 DIAGNOSIS — M2142 Flat foot [pes planus] (acquired), left foot: Secondary | ICD-10-CM

## 2020-01-14 DIAGNOSIS — Z794 Long term (current) use of insulin: Secondary | ICD-10-CM

## 2020-01-14 DIAGNOSIS — E118 Type 2 diabetes mellitus with unspecified complications: Secondary | ICD-10-CM | POA: Diagnosis not present

## 2020-01-14 DIAGNOSIS — M2141 Flat foot [pes planus] (acquired), right foot: Secondary | ICD-10-CM

## 2020-01-14 DIAGNOSIS — L84 Corns and callosities: Secondary | ICD-10-CM

## 2020-01-14 NOTE — Patient Instructions (Signed)
Diabetes Mellitus and Foot Care Foot care is an important part of your health, especially when you have diabetes. Diabetes may cause you to have problems because of poor blood flow (circulation) to your feet and legs, which can cause your skin to:  Become thinner and drier.  Break more easily.  Heal more slowly.  Peel and crack. You may also have nerve damage (neuropathy) in your legs and feet, causing decreased feeling in them. This means that you may not notice minor injuries to your feet that could lead to more serious problems. Noticing and addressing any potential problems early is the best way to prevent future foot problems. How to care for your feet Foot hygiene  Wash your feet daily with warm water and mild soap. Do not use hot water. Then, pat your feet and the areas between your toes until they are completely dry. Do not soak your feet as this can dry your skin.  Trim your toenails straight across. Do not dig under them or around the cuticle. File the edges of your nails with an emery board or nail file.  Apply a moisturizing lotion or petroleum jelly to the skin on your feet and to dry, brittle toenails. Use lotion that does not contain alcohol and is unscented. Do not apply lotion between your toes. Shoes and socks  Wear clean socks or stockings every day. Make sure they are not too tight. Do not wear knee-high stockings since they may decrease blood flow to your legs.  Wear shoes that fit properly and have enough cushioning. Always look in your shoes before you put them on to be sure there are no objects inside.  To break in new shoes, wear them for just a few hours a day. This prevents injuries on your feet. Wounds, scrapes, corns, and calluses  Check your feet daily for blisters, cuts, bruises, sores, and redness. If you cannot see the bottom of your feet, use a mirror or ask someone for help.  Do not cut corns or calluses or try to remove them with medicine.  If you  find a minor scrape, cut, or break in the skin on your feet, keep it and the skin around it clean and dry. You may clean these areas with mild soap and water. Do not clean the area with peroxide, alcohol, or iodine.  If you have a wound, scrape, corn, or callus on your foot, look at it several times a day to make sure it is healing and not infected. Check for: ? Redness, swelling, or pain. ? Fluid or blood. ? Warmth. ? Pus or a bad smell. General instructions  Do not cross your legs. This may decrease blood flow to your feet.  Do not use heating pads or hot water bottles on your feet. They may burn your skin. If you have lost feeling in your feet or legs, you may not know this is happening until it is too late.  Protect your feet from hot and cold by wearing shoes, such as at the beach or on hot pavement.  Schedule a complete foot exam at least once a year (annually) or more often if you have foot problems. If you have foot problems, report any cuts, sores, or bruises to your health care provider immediately. Contact a health care provider if:  You have a medical condition that increases your risk of infection and you have any cuts, sores, or bruises on your feet.  You have an injury that is not   healing.  You have redness on your legs or feet.  You feel burning or tingling in your legs or feet.  You have pain or cramps in your legs and feet.  Your legs or feet are numb.  Your feet always feel cold.  You have pain around a toenail. Get help right away if:  You have a wound, scrape, corn, or callus on your foot and: ? You have pain, swelling, or redness that gets worse. ? You have fluid or blood coming from the wound, scrape, corn, or callus. ? Your wound, scrape, corn, or callus feels warm to the touch. ? You have pus or a bad smell coming from the wound, scrape, corn, or callus. ? You have a fever. ? You have a red line going up your leg. Summary  Check your feet every day  for cuts, sores, red spots, swelling, and blisters.  Moisturize feet and legs daily.  Wear shoes that fit properly and have enough cushioning.  If you have foot problems, report any cuts, sores, or bruises to your health care provider immediately.  Schedule a complete foot exam at least once a year (annually) or more often if you have foot problems. This information is not intended to replace advice given to you by your health care provider. Make sure you discuss any questions you have with your health care provider. Document Revised: 02/06/2019 Document Reviewed: 06/17/2016 Elsevier Patient Education  2020 [employer]  

## 2020-01-15 NOTE — Progress Notes (Signed)
  Subjective:  Patient ID: Jeremy Clark, male    DOB: December 21, 1965,  MRN: [id number]  Chief Complaint  Patient presents with  . Callouses    Left foot; plantar forefoot-submet 2 & medial side-submet 1; pt stated, "I just noticed this a week ago; hurts to walk on foot"; x1 week  . Debridement    Bilateral nail trim; pt Diabetic Type 2; Sugar=94 this am; A1C=pt stated, "6.1 on 11/07/19"-not in chart  . Diabetic Foot Exam    Needs basic diabetic foot exam done    54 y.o. male presents with the above complaint. History confirmed with patient.  He has been on an aggressive weight loss plan, and has lost a significant amount (planned) of weight since his wife passed away.  He is hoping to have a ventral hernia repair and is meeting with his surgeon tomorrow.  Since losing weight his A1c has essentially normalized and he has been able to reduce the amount of medications he takes for diabetes.  Objective:  Physical Exam: warm, good capillary refill, no trophic changes or ulcerative lesions, normal DP and PT pulses, normal sensory exam and onychomycosis of all toenails.  Mild hyperkeratosis and dry skin left submet 2 and pinch callus on the medial hallux  Assessment:   1. Controlled type 2 diabetes mellitus with complication, with long-term current use of insulin ([place])   2. Onychomycosis   3. Pain due to onychomycosis of toenails of both feet   4. Callus of foot   5. Pes planus of both feet      Plan:  Patient was evaluated and treated and all questions answered.   Patient educated on diabetes. Discussed proper diabetic foot care and discussed risks and complications of disease. Educated patient in depth on reasons to return to the office immediately should he/she discover anything concerning or new on the feet. All questions answered. Discussed proper shoes as well.   Discussed the etiology and treatment options for the condition in detail with the patient. Educated patient on the topical and  oral treatment options for mycotic nails. Recommended debridement of the nails today. Sharp and mechanical debridement performed of all painful and mycotic nails today. Nails debrided in length and thickness using a nail nipper and a mechanical burr to level of comfort. Discussed treatment options including appropriate shoe gear. Follow up as needed for painful nails.  All symptomatic hyperkeratoses were safely debrided with a sterile #15 blade to patient's level of comfort without incident. We discussed preventative and palliative care of these lesions including supportive and accommodative shoegear, padding, prefabricated and custom molded accommodative orthoses, use of a pumice stone and lotions/creams daily.   Return in about 3 months (around 04/15/2020) for [facility].

## 2020-01-23 ENCOUNTER — Ambulatory Visit (INDEPENDENT_AMBULATORY_CARE_PROVIDER_SITE_OTHER): Payer: 59 | Admitting: Podiatry

## 2020-01-23 ENCOUNTER — Other Ambulatory Visit: Payer: Self-pay

## 2020-01-23 DIAGNOSIS — L600 Ingrowing nail: Secondary | ICD-10-CM

## 2020-01-23 DIAGNOSIS — S90424A Blister (nonthermal), right lesser toe(s), initial encounter: Secondary | ICD-10-CM | POA: Diagnosis not present

## 2020-01-24 NOTE — Progress Notes (Signed)
  Subjective:  Patient ID: Jeremy Clark, male    DOB: Jun 14, 1965,  MRN: [id number]  Chief Complaint  Patient presents with  . Nail Problem    Severe pain in right great toe, bleeding after RFC last week *Diabetic    54 y.o. male presents with the above complaint. History confirmed with patient.  Noted a blister forming where the right hallux medial nail border was debrided  Objective:  Physical Exam: warm, good capillary refill, no trophic changes or ulcerative lesions, normal DP and PT pulses, normal sensory exam and right hallux medial nail border has slightly ingrown portion with a distal nail fold blister intact.  Assessment:   1. Ingrowing right great toenail   2. Blister of toe of right foot without infection, initial encounter      Plan:  Patient was evaluated and treated and all questions answered.   Right hallux medial nail border was debrided and slant back fashion to alleviate pressure.  Blister was left intact.  I did advise him if it ruptures that he should apply antibiotic ointment and a Band-Aid.  We will recheck this in 3 weeks   Return in about 3 weeks (around 02/13/2020) for nail re-check.

## 2020-02-20 ENCOUNTER — Ambulatory Visit: Payer: 59 | Admitting: Podiatry

## 2020-04-16 ENCOUNTER — Ambulatory Visit (INDEPENDENT_AMBULATORY_CARE_PROVIDER_SITE_OTHER): Payer: 59 | Admitting: Podiatry

## 2020-04-16 ENCOUNTER — Encounter: Payer: Self-pay | Admitting: Podiatry

## 2020-04-16 ENCOUNTER — Other Ambulatory Visit: Payer: Self-pay

## 2020-04-16 DIAGNOSIS — M79675 Pain in left toe(s): Secondary | ICD-10-CM | POA: Diagnosis not present

## 2020-04-16 DIAGNOSIS — L600 Ingrowing nail: Secondary | ICD-10-CM | POA: Diagnosis not present

## 2020-04-16 DIAGNOSIS — Z794 Long term (current) use of insulin: Secondary | ICD-10-CM

## 2020-04-16 DIAGNOSIS — B351 Tinea unguium: Secondary | ICD-10-CM

## 2020-04-16 DIAGNOSIS — L84 Corns and callosities: Secondary | ICD-10-CM

## 2020-04-16 DIAGNOSIS — M79674 Pain in right toe(s): Secondary | ICD-10-CM | POA: Diagnosis not present

## 2020-04-16 DIAGNOSIS — E118 Type 2 diabetes mellitus with unspecified complications: Secondary | ICD-10-CM

## 2020-04-16 DIAGNOSIS — M2141 Flat foot [pes planus] (acquired), right foot: Secondary | ICD-10-CM

## 2020-04-16 DIAGNOSIS — M2142 Flat foot [pes planus] (acquired), left foot: Secondary | ICD-10-CM

## 2020-04-16 NOTE — Progress Notes (Signed)
  Subjective:  Patient ID: Jeremy Clark, male    DOB: [birth year],  MRN: [id number]  Chief Complaint  Patient presents with  . Nail Problem    thick painful toenails, 3 month follow up  . Diabetes    54 y.o. male presents with the above complaint. History confirmed with patient.  Was recently diagnosed with prostate cancer, has not had definitive treatment yet  Objective:  Physical Exam: warm, good capillary refill, no trophic changes or ulcerative lesions, normal DP and PT pulses, normal sensory exam, onychomycosis x10  Assessment:   1. Ingrowing right great toenail   2. Controlled type 2 diabetes mellitus with complication, with long-term current use of insulin ([place])   3. Onychomycosis   4. Pain due to onychomycosis of toenails of both feet   5. Pes planus of both feet   6. Callus of foot      Plan:  Patient was evaluated and treated and all questions answered.  Patient educated on diabetes. Discussed proper diabetic foot care and discussed risks and complications of disease. Educated patient in depth on reasons to return to the office immediately should he/she discover anything concerning or new on the feet. All questions answered. Discussed proper shoes as well.    Discussed the etiology and treatment options for the condition in detail with the patient. Educated patient on the topical and oral treatment options for mycotic nails. Recommended debridement of the nails today. Sharp and mechanical debridement performed of all painful and mycotic nails today. Nails debrided in length and thickness using a nail nipper and a mechanical burr to level of comfort. Discussed treatment options including appropriate shoe gear. Follow up as needed for painful nails.     Return in about 3 months (around 07/17/2020) for diabetic nail trim.

## 2020-05-04 ENCOUNTER — Encounter: Payer: Self-pay | Admitting: Radiation Oncology

## 2020-05-04 NOTE — Progress Notes (Signed)
GU Location of Tumor / Histology: prostatic adenocarcinoma  If Prostate Cancer, Gleason Score is (3 + 4) and PSA is (10.8). Prostate volume:40.7 grams  02/20/2020 PSA 11.80  Biopsies of prostate (if applicable) revealed:    Past/Anticipated interventions by urology, if any: prostate biopsy (negative), repeat prostate biopsy, referral to Dr. Tammi Klippel for consideration of radiation therapy  Past/Anticipated interventions by medical oncology, if any: no  Weight changes, if any: denies  Bowel/Bladder complaints, if any: IPSS 7. SHIM 20. Denies dysuria, hematuria, or urinary leakage.Reports occasional urinary urgency and frequency. Reports nocturia x 1. Endorses taking tamsulosin. Denies any bowel complaints.   Patient reports since his prostate biopsy one month ago he has had difficulty breathing and chest pressure making it difficult for him to exercise.   Nausea/Vomiting, if any: no  Pain issues, if any:  denies  SAFETY ISSUES:  Prior radiation? denies  Pacemaker/ICD? denies  Possible current pregnancy? no, male patient   Is the patient on methotrexate? denies  Current Complaints / other details:  54 year old male. Widowed with one son and one daughter (passed at 80). Remarried with 1 stepson. Father with hx of prostate cancer (deceased). First wife died of breast cancer. She was treated here at [facility].

## 2020-05-05 ENCOUNTER — Encounter: Payer: Self-pay | Admitting: Medical Oncology

## 2020-05-05 ENCOUNTER — Encounter: Payer: Self-pay | Admitting: Radiation Oncology

## 2020-05-05 ENCOUNTER — Ambulatory Visit
Admission: RE | Admit: 2020-05-05 | Discharge: 2020-05-05 | Disposition: A | Discharge status: Home / Self Care | Payer: 59 | Source: Ambulatory Visit | Attending: Radiation Oncology | Admitting: Radiation Oncology

## 2020-05-05 ENCOUNTER — Other Ambulatory Visit: Payer: Self-pay

## 2020-05-05 VITALS — BP 169/82 | HR 68 | Temp 97.3°F | Resp 18 | Ht 64.0 in | Wt 288.2 lb

## 2020-05-05 DIAGNOSIS — Z79899 Other long term (current) drug therapy: Secondary | ICD-10-CM | POA: Diagnosis not present

## 2020-05-05 DIAGNOSIS — E119 Type 2 diabetes mellitus without complications: Secondary | ICD-10-CM | POA: Insufficient documentation

## 2020-05-05 DIAGNOSIS — C61 Malignant neoplasm of prostate: Secondary | ICD-10-CM | POA: Insufficient documentation

## 2020-05-05 DIAGNOSIS — I10 Essential (primary) hypertension: Secondary | ICD-10-CM | POA: Diagnosis not present

## 2020-05-05 DIAGNOSIS — E7800 Pure hypercholesterolemia, unspecified: Secondary | ICD-10-CM | POA: Diagnosis not present

## 2020-05-05 DIAGNOSIS — Z87891 Personal history of nicotine dependence: Secondary | ICD-10-CM | POA: Diagnosis not present

## 2020-05-05 HISTORY — DX: Malignant neoplasm of prostate: C61

## 2020-05-05 NOTE — Progress Notes (Signed)
Introduced myself to patient as the prostate nurse navigator and discussed my role. He is here to discuss his radiation treatment options but is leaning towards external beam. He shared that his wife was treated her for breast cancer and passed in 2019. No barriers identified at this time. I gave him my business card and asked him to call me if I can assist him in any way. He voiced understanding.

## 2020-05-05 NOTE — Progress Notes (Signed)
Radiation Oncology         [phone] ________________________________  Initial outpatient Consultation  Name: SOUL HACKMAN MRN: [id number]  Date: 05/05/2020  DOB: August 31, 1965  CC:Gregor Hams, FNP  [name]*   REFERRING PHYSICIAN: [name]*  DIAGNOSIS: 54 y.o. gentleman with Stage T1c adenocarcinoma of the prostate with Gleason score of 3+4, and PSA of 11.8.    ICD-10-CM   1. Prostate cancer [facility])  C61     HISTORY OF PRESENT ILLNESS: BOBBY BARTON is a 54 y.o. male with a diagnosis of prostate cancer. He has a history of rising PSA since 2015: 02/2014 - 3.1 06/2016 - 4.0 01/2017 - 4.4  When his PSA reached 4.4 in 02/2017 , he proceeded to prostate biopsy under the care of Dr. Tommi Rumps, which was benign. Since that time, his PSA has been followed by his PCP and increased to 10.5 in 08/2019 and further to 10.8 in 10/2019. He proceeded to prostate MRI on 12/19/2019 at [facility] showing a 2 cm P-IRADS 5 lesion in the left anterior transitional zone within the base and a 1.8 cm PI-RADS 3 lesion in the right peripheral zone, mid to apex.  Accordingly, he was referred for evaluation in urology by Dr. [name] on 02/20/2020,  digital rectal examination was performed at that time revealing no nodules. Repeat PSA was also performed showing further elevation to 11.8. The patient proceeded to MRI fusion biopsy on 03/26/2020.  The prostate volume measured 40.7 cc.  Out of 18 core biopsies, 2 were positive.  The maximum Gleason score was 3+4, and this was seen in the right apex and right mid lateral. 3/3 samples from each of the ROI MRI lesions were benign.  He subsequently developed a post-procedure UTI that was resistant to Cipro and Bactrim, and he was started on Augmentin which he is completing as prescribed and symptoms are gradually improving. He met with Dr. Alinda Money on 04/28/20 to discuss surgical options but is adamant that he does not want RALP.  The patient  reviewed the biopsy results with his urologist and he has kindly been referred today for discussion of potential radiation treatment options.   PREVIOUS RADIATION THERAPY: No  PAST MEDICAL HISTORY:  Past Medical History:  Diagnosis Date  . Diabetes mellitus   . High cholesterol   . Hypertension   . Prostate cancer ([place])       PAST SURGICAL HISTORY: Past Surgical History:  Procedure Laterality Date  . PROSTATE BIOPSY    . SHOULDER SURGERY     right    FAMILY HISTORY:  Family History  Problem Relation Age of Onset  . Hypertension Mother   . Prostate cancer Father   . Aneurysm Daughter   . Colon cancer Neg Hx   . Pancreatic cancer Neg Hx   . Breast cancer Neg Hx     SOCIAL HISTORY:  Social History   Socioeconomic History  . Marital status: Married    Spouse name: Not on file  . Number of children: 2  . Years of education: Not on file  . Highest education level: Not on file  Occupational History  . Occupation: Freight forwarder    Comment: full time  Tobacco Use  . Smoking status: Former Smoker    Packs/day: 0.50    Years: 20.00    Pack years: 10.00    Quit date: 05/31/2003    Years since quitting: 16.9  . Smokeless tobacco: Never Used  Vaping Use  . Vaping  Use: Never used  Substance and Sexual Activity  . Alcohol use: Not Currently  . Drug use: No  . Sexual activity: Not on file  Other Topics Concern  . Not on file  Social History Narrative  . Not on file   Social Determinants of Health   Financial Resource Strain: Not on file  Food Insecurity: Not on file  Transportation Needs: Not on file  Physical Activity: Not on file  Stress: Not on file  Social Connections: Not on file  Intimate Partner Violence: Not on file    ALLERGIES: Patient has no known allergies.  MEDICATIONS:  Current Outpatient Medications  Medication Sig Dispense Refill  . busPIRone (BUSPAR) 10 MG tablet Take 10 mg by mouth 2 (two) times daily.    . Canagliflozin-metFORMIN HCl  (INVOKAMET) [phone] MG TABS Take by mouth.    Marland Kitchen lisinopril (PRINIVIL,ZESTRIL) 30 MG tablet Take 30 mg by mouth daily.    . simvastatin (ZOCOR) 20 MG tablet Take 20 mg by mouth daily.    . tamsulosin (FLOMAX) 0.4 MG CAPS capsule Take 0.4 mg by mouth daily.     No current facility-administered medications for this encounter.    REVIEW OF SYSTEMS:  On review of systems, the patient reports that he is doing well overall. He denies any chest pain, shortness of breath, cough, fevers, chills, night sweats, unintended weight changes. He denies any bowel disturbances, and denies abdominal pain, nausea or vomiting. He denies any new musculoskeletal or joint aches or pains. His IPSS was 7, indicating mild urinary symptoms. He reports nocturia x1 and occasional urinary urgency and frequency. He endorses taking tamsulosin. His SHIM was 20, indicating he does not have erectile dysfunction. A complete review of systems is obtained and is otherwise negative.    PHYSICAL EXAM:  Wt Readings from Last 3 Encounters:  05/06/20 291 lb (132 kg)  05/05/20 288 lb 4 oz (130.7 kg)  11/21/19 290 lb (131.5 kg)   Temp Readings from Last 3 Encounters:  05/06/20 97.9 F (36.6 C) (Oral)  05/05/20 (!) 97.3 F (36.3 C) (Temporal)  01/14/20 97.7 F (36.5 C)   BP Readings from Last 3 Encounters:  05/06/20 (!) 164/77  05/05/20 (!) 169/82  01/14/20 123/65   Pulse Readings from Last 3 Encounters:  05/06/20 65  05/05/20 68  01/14/20 80   Pain Assessment Pain Score: 0-No pain/10  In general this is a well appearing African American male in no acute distress. He is alert and oriented x4 and appropriate throughout the examination. HEENT reveals that the patient is normocephalic, atraumatic. EOMs are intact. PERRLA. Skin is intact without any evidence of gross lesions. Cardiopulmonary assessment is negative for acute distress and he exhibits normal effort.The abdomen is soft, non tender, non distended. Lower extremities  are negative for pretibial pitting edema, deep calf tenderness, cyanosis or clubbing.  KPS = 90  100 - Normal; no complaints; no evidence of disease. 90   - Able to carry on normal activity; minor signs or symptoms of disease. 80   - Normal activity with effort; some signs or symptoms of disease. 27   - Cares for self; unable to carry on normal activity or to do active work. 60   - Requires occasional assistance, but is able to care for most of his personal needs. 50   - Requires considerable assistance and frequent medical care. 76   - Disabled; requires special care and assistance. 30   - Severely disabled; hospital admission is indicated  although death not imminent. 52   - Very sick; hospital admission necessary; active supportive treatment necessary. 10   - Moribund; fatal processes progressing rapidly. 0     - Dead  Karnofsky DA, Abelmann [place], Craver LS and Burchenal [facility] [phone]) The use of the nitrogen mustards in the palliative treatment of carcinoma: with particular reference to bronchogenic carcinoma Cancer 1 634-56  LABORATORY DATA:  Lab Results  Component Value Date   WBC 5.5 05/06/2020   HGB 13.9 05/06/2020   HCT 40.7 05/06/2020   MCV 92.3 05/06/2020   PLT 201 05/06/2020   Lab Results  Component Value Date   NA 139 05/06/2020   K 3.8 05/06/2020   CL 107 05/06/2020   CO2 24 05/06/2020   Lab Results  Component Value Date   ALT 36 06/28/2015   AST 28 06/28/2015   ALKPHOS 80 06/28/2015   BILITOT 0.6 06/28/2015     RADIOGRAPHY: DG Chest 2 View  Result Date: 05/06/2020 CLINICAL DATA:  Chest pain and pressure which is worsening. EXAM: CHEST - 2 VIEW COMPARISON:  06/28/2015 FINDINGS: Heart size upper limits of normal. Mediastinal shadows are normal. The lungs are clear. No infiltrate, collapse or effusion. No significant bone finding. IMPRESSION: No active cardiopulmonary disease. Electronically Signed   By: Nelson Chimes M.D.   On: 05/06/2020 13:19       IMPRESSION/PLAN: 1. 54 y.o. gentleman with Stage T1c adenocarcinoma of the prostate with Gleason Score of 3+4, and PSA of 11.8. We discussed the patient's workup and outlined the nature of prostate cancer in this setting. The patient's T stage, Gleason's score, and PSA put him into the intermediate risk group. Accordingly, he is eligible for a variety of potential treatment options including brachytherapy, 5.5 weeks of external radiation, or prostatectomy. We discussed the available radiation techniques, and focused on the details and logistics of delivery. We discussed and outlined the risks, benefits, short and long-term effects associated with radiotherapy and compared and contrasted these with prostatectomy. We discussed the role of SpaceOAR gel in reducing the rectal toxicity associated with radiotherapy.  He was encouraged to ask questions that were answered to his stated satisfaction.  At the conclusion of our conversation, the patient is interested in moving forward with 5.5 weeks of external beam therapy. Given his prostatitis following biopsy, which he is still dealing with, we have agreed to forego fiducial marker and SpaceOAR gel placement, at his request. We will share our discussion with Dr. [name]. The patient appears to have a good understanding of his disease and our treatment recommendations which are of curative intent and is in agreement with the stated plan.  Therefore, we will move forward with treatment planning accordingly, in anticipation of beginning IMRT in the near future.    [name], PA-C    Tyler Pita, MD  [facility] Direct Dial: [phone]  Fax: [phone] [email or web address]  Skype  [email or web address]   This document serves as a record of services personally performed by Tyler Pita, MD and Freeman Caldron, PA-C. It was created on their behalf by Wilburn Mylar, a trained medical scribe. The creation of this record is based on the  scribe's personal observations and the provider's statements to them. This document has been checked and approved by the attending provider.

## 2020-05-06 ENCOUNTER — Emergency Department (HOSPITAL_BASED_OUTPATIENT_CLINIC_OR_DEPARTMENT_OTHER): Payer: 59

## 2020-05-06 ENCOUNTER — Other Ambulatory Visit: Payer: Self-pay

## 2020-05-06 ENCOUNTER — Emergency Department (HOSPITAL_BASED_OUTPATIENT_CLINIC_OR_DEPARTMENT_OTHER)
Admission: EM | Admit: 2020-05-06 | Discharge: 2020-05-06 | Disposition: A | Discharge status: Home / Self Care | Payer: 59 | Attending: Emergency Medicine | Admitting: Emergency Medicine

## 2020-05-06 ENCOUNTER — Encounter (HOSPITAL_BASED_OUTPATIENT_CLINIC_OR_DEPARTMENT_OTHER): Payer: Self-pay

## 2020-05-06 DIAGNOSIS — E119 Type 2 diabetes mellitus without complications: Secondary | ICD-10-CM | POA: Diagnosis not present

## 2020-05-06 DIAGNOSIS — Z87891 Personal history of nicotine dependence: Secondary | ICD-10-CM | POA: Insufficient documentation

## 2020-05-06 DIAGNOSIS — R079 Chest pain, unspecified: Secondary | ICD-10-CM | POA: Insufficient documentation

## 2020-05-06 DIAGNOSIS — Z79899 Other long term (current) drug therapy: Secondary | ICD-10-CM | POA: Diagnosis not present

## 2020-05-06 DIAGNOSIS — I10 Essential (primary) hypertension: Secondary | ICD-10-CM | POA: Diagnosis not present

## 2020-05-06 DIAGNOSIS — Z7984 Long term (current) use of oral hypoglycemic drugs: Secondary | ICD-10-CM | POA: Diagnosis not present

## 2020-05-06 DIAGNOSIS — Z794 Long term (current) use of insulin: Secondary | ICD-10-CM | POA: Insufficient documentation

## 2020-05-06 LAB — BASIC METABOLIC PANEL
Anion gap: 8 (ref 5–15)
BUN: 12 mg/dL (ref 6–20)
CO2: 24 mmol/L (ref 22–32)
Calcium: 8.8 mg/dL — ABNORMAL LOW (ref 8.9–10.3)
Chloride: 107 mmol/L (ref 98–111)
Creatinine, Ser: 0.88 mg/dL (ref 0.61–1.24)
GFR, Estimated: >60 mL/min (ref >60)
Glucose, Bld: 123 mg/dL — ABNORMAL HIGH (ref 70–99)
Potassium: 3.8 mmol/L (ref 3.5–5.1)
Sodium: 139 mmol/L (ref 135–145)

## 2020-05-06 LAB — CBC
HCT: 40.7 % (ref 39.0–52.0)
Hemoglobin: 13.9 g/dL (ref 13.0–17.0)
MCH: 31.5 pg (ref 26.0–34.0)
MCHC: 34.2 g/dL (ref 30.0–36.0)
MCV: 92.3 fL (ref 80.0–100.0)
Platelets: 201 10*3/uL (ref 150–400)
RBC: 4.41 MIL/uL (ref 4.22–5.81)
RDW: 14.6 % (ref 11.5–15.5)
WBC: 5.5 10*3/uL (ref 4.0–10.5)
nRBC: 0 % (ref 0.0–0.2)

## 2020-05-06 LAB — TROPONIN I (HIGH SENSITIVITY)
Troponin I (High Sensitivity): 4 ng/L (ref <18)
Troponin I (High Sensitivity): 4 ng/L (ref <18)

## 2020-05-06 LAB — D-DIMER, QUANTITATIVE: D-Dimer, Quant: 0.54 ug/mL-FEU — ABNORMAL HIGH (ref 0.00–0.50)

## 2020-05-06 MED ORDER — IOHEXOL 350 MG/ML SOLN: 100.0000 mL | Freq: Once | INTRAVENOUS | Status: DC | PRN

## 2020-05-06 NOTE — Discharge Instructions (Signed)
As we discussed, your work-up today was reassuring.  As we discussed, your D-dimer was slightly elevated which was a few days ago.  You had a CT of your chest to rule out blood clot.  You opted not to repeat that.    We have given you a referral to cardiology.  Please contact your primary care doctor as well.  Return the emergency department at anytime if you feel like the chest pain is getting worse, breathing is getting worse, vomiting or any other worsening concerning symptoms.

## 2020-05-06 NOTE — ED Triage Notes (Addendum)
Pt c/o CP x 2 weeks-states she was seen by PCP had EKG, blood work and "a scan"-NAD-steady gait

## 2020-05-06 NOTE — ED Notes (Signed)
Patient told CT tech that he had a CT chest done recently and does not want to have another one done, also he stated that he gets claustrophobic in the scanner, PA notified.

## 2020-05-06 NOTE — ED Notes (Signed)
ED Provider at bedside. 

## 2020-05-06 NOTE — ED Provider Notes (Signed)
[facility] EMERGENCY DEPARTMENT Provider Note   CSN: [id number] Arrival date & time: 05/06/20  1219     History Chief Complaint  Patient presents with  . Chest Pain    Jeremy Clark is a 54 y.o. male possible history of diabetes, hypercholesterol, hypertension, prostate cancer who presents for evaluation of chest pain.  He states this is been ongoing for about 2 weeks.  Describes it as intermittent.  He states it is more of a pressure sensation and feeling like he cannot catch his breath.  He states that it happens randomly.  He has had episodes where he has been sitting still and it will occur.  He does not notice it more when he gets up and walks around.  He states he will feel a type of heaviness on his chest and then feel like he cannot catch his breath or take a deep breath in which he states is abnormal for him.  He states the pain is worse when he takes a deep breath in.  No associated nausea/vomiting/diaphoresis.  He was evaluated for this about 5 days ago.  His primary care doctor got a CTA and blood work.  His CT of his chest was negative for any PE.  He states he has not been sick recently with any fevers, cough, chills.  He has not had any abdominal pain.  He was recently diagnosed with prostate cancer.  He has not started any treatment.  He has no history of DVT or PE.  He denies any recent travel, surgeries, exogenous hormone use, leg swelling.  He does not smoke.  He does have a history of hypertension, diabetes.  No personal cardiac history.  No family history of MI before the age of 89 that he knows of.  He does report that he had a history of anxiety several years ago.  He states that when he did have anxiety attacks, he did have some similar difficulty breathing.  He has not had anxiety attacks in several years.  The history is provided by the patient.    HPI: A 54 year old patient with a history of treated diabetes, hypertension, hypercholesterolemia and obesity  presents for evaluation of chest pain. Initial onset of pain was more than 6 hours ago. The patient's chest pain is described as heaviness/pressure/tightness and is worse with exertion. The patient's chest pain is not middle- or left-sided, is not well-localized, is not sharp and does not radiate to the arms/jaw/neck. The patient does not complain of nausea and denies diaphoresis. The patient has no history of stroke, has no history of peripheral artery disease, has not smoked in the past 90 days and has no relevant family history of coronary artery disease (first degree relative at less than age 19).   Past Medical History:  Diagnosis Date  . Diabetes mellitus   . High cholesterol   . Hypertension   . Prostate cancer [facility])     Patient Active Problem List   Diagnosis Date Noted  . Elevated PSA 01/01/2020  . Prostate mass 01/01/2020  . Prostate cancer ([place]) 01/01/2020  . Vitamin D deficiency 11/08/2019  . Controlled type 2 diabetes mellitus with complication, with long-term current use of insulin ([place]) 10/15/2018  . Essential hypertension 10/15/2018  . Hyperlipidemia 10/15/2018  . Abdominal wall hernia 03/19/2015  . Morbid obesity with BMI of 50.0-59.9, adult ([place]) 03/27/2014    Past Surgical History:  Procedure Laterality Date  . PROSTATE BIOPSY    . SHOULDER SURGERY  right       Family History  Problem Relation Age of Onset  . Hypertension Mother   . Prostate cancer Father   . Aneurysm Daughter   . Colon cancer Neg Hx   . Pancreatic cancer Neg Hx   . Breast cancer Neg Hx     Social History   Tobacco Use  . Smoking status: Former Smoker    Packs/day: 0.50    Years: 20.00    Pack years: 10.00    Quit date: 05/31/2003    Years since quitting: 16.9  . Smokeless tobacco: Never Used  Vaping Use  . Vaping Use: Never used  Substance Use Topics  . Alcohol use: Not Currently  . Drug use: No    Home Medications Prior to Admission medications   Medication Sig Start  Date End Date Taking? Authorizing Provider  busPIRone (BUSPAR) 10 MG tablet Take 10 mg by mouth 2 (two) times daily.    [provider]  Canagliflozin-metFORMIN HCl (INVOKAMET) [phone] MG TABS Take by mouth. 04/30/20   [provider]  lisinopril (PRINIVIL,ZESTRIL) 30 MG tablet Take 30 mg by mouth daily.    [provider]  simvastatin (ZOCOR) 20 MG tablet Take 20 mg by mouth daily.    [provider]  tamsulosin (FLOMAX) 0.4 MG CAPS capsule Take 0.4 mg by mouth daily. 03/29/20   [provider]    Allergies    Patient has no known allergies.  Review of Systems   Review of Systems  Constitutional: Negative for fever.  Respiratory: Positive for shortness of breath.   Cardiovascular: Positive for chest pain. Negative for leg swelling.  Gastrointestinal: Negative for abdominal pain, nausea and vomiting.  Musculoskeletal: Negative for neck pain.  Neurological: Negative for weakness and numbness.  All other systems reviewed and are negative.   Physical Exam Updated Vital Signs BP (!) 164/77 (BP Location: Right Arm)   Pulse 65   Temp 97.9 F (36.6 C) (Oral)   Resp 19   Ht 5\' 4"  (1.626 m)   Wt 132 kg   SpO2 100%   BMI 49.95 kg/m   Physical Exam Vitals and nursing note reviewed.  Constitutional:      Appearance: Normal appearance. He is well-developed.  HENT:     Head: Normocephalic and atraumatic.  Eyes:     General: Lids are normal.     Conjunctiva/sclera: Conjunctivae normal.     Pupils: Pupils are equal, round, and reactive to light.  Cardiovascular:     Rate and Rhythm: Normal rate and regular rhythm.     Pulses: Normal pulses.     Heart sounds: Normal heart sounds. No murmur heard.  No friction rub. No gallop.   Pulmonary:     Effort: Pulmonary effort is normal.     Breath sounds: Normal breath sounds.     Comments: Lungs clear to auscultation bilaterally.  Symmetric chest rise.  No wheezing, rales, rhonchi. Abdominal:      Palpations: Abdomen is soft. Abdomen is not rigid.     Tenderness: There is no abdominal tenderness. There is no guarding.     Hernia: A hernia is present.     Comments: Abdomen is soft, non-distended, non-tender. No rigidity, No guarding. No peritoneal signs.  Ventral hernia noted.  No tenderness.  No overlying warmth, erythema.+  Musculoskeletal:        General: Normal range of motion.     Cervical back: Full passive range of motion without pain.  Skin:  General: Skin is warm and dry.     Capillary Refill: Capillary refill takes less than 2 seconds.  Neurological:     Mental Status: He is alert and oriented to person, place, and time.  Psychiatric:        Speech: Speech normal.     ED Results / Procedures / Treatments   Labs (all labs ordered are listed, but only abnormal results are displayed) Labs Reviewed  BASIC METABOLIC PANEL - Abnormal; Notable for the following components:      Result Value   Glucose, Bld 123 (*)    Calcium 8.8 (*)    All other components within normal limits  D-DIMER, QUANTITATIVE (NOT AT [facility]) - Abnormal; Notable for the following components:   D-Dimer, Quant 0.54 (*)    All other components within normal limits  CBC  TROPONIN I (HIGH SENSITIVITY)  TROPONIN I (HIGH SENSITIVITY)    EKG EKG Interpretation  Date/Time:  Wednesday May 06 2020 12:22:03 EST Ventricular Rate:  78 PR Interval:  140 QRS Duration: 82 QT Interval:  372 QTC Calculation: 424 R Axis:   40 Text Interpretation: Normal sinus rhythm Normal ECG No STEMI Confirmed by Octaviano Glow [phone]) on 05/06/2020 12:53:16 PM   Radiology DG Chest 2 View  Result Date: 05/06/2020 CLINICAL DATA:  Chest pain and pressure which is worsening. EXAM: CHEST - 2 VIEW COMPARISON:  06/28/2015 FINDINGS: Heart size upper limits of normal. Mediastinal shadows are normal. The lungs are clear. No infiltrate, collapse or effusion. No significant bone finding. IMPRESSION: No active  cardiopulmonary disease. Electronically Signed   By: Nelson Chimes M.D.   On: 05/06/2020 13:19    Procedures Procedures (including critical care time)  Medications Ordered in ED Medications  iohexol (OMNIPAQUE) 350 MG/ML injection 100 mL ( Intravenous Canceled Entry 05/06/20 1507)    ED Course  I have reviewed the triage vital signs and the nursing notes.  Pertinent labs & imaging results that were available during my care of the patient were reviewed by me and considered in my medical decision making (see chart for details).    MDM Rules/Calculators/A&P HEAR Score: 32                        54 year old male past no history of hyperlipidemia, hypertension, diabetes recently diagnosed with prostate cancer presents for evaluation of 2 weeks of intermittent chest pain.  States is pleuritic in nature.  Feels like when he starts walking around, he will get short of breath which is new for him.  He does not have this all the time.  He states he will sometimes get it while sitting still as well.  He was seen by his primary care doctor for this and had a CTA done of his chest last week that was negative for any PE.  On initial ED arrival, he is slightly hypertensive.  He states he has been having some issues with his blood pressure but states he has been compliant with his medication.  Vitals otherwise stable.  No hypoxia, tachycardia.  Low suspicion for ACS as he has some atypical features but given his age, risk factors will obtain EKG, lab work.  Given his history of cancer, will obtain a D-dimer.  Chest x-ray, EKG ordered at triage.  History/physical exam not concerning for dissection.  We discussed the possibility that this could be anxiety given her recent stressors of recent cancer diagnosis.  BMP shows normal BUN and creatinine.  Troponin  is negative.  CBC shows no leukocytosis or anemia.  D-dimer is positive.  Chest x-ray negative for any acute infectious etiology.  Delta trop is negative.    I discussed results with patient.  I had ordered a CTA scan given his elevated D-dimer.  He did have a CT scan done recently.  He did not want to obtain another one.  We discussed at length regarding his elevated D-dimer.  It is comparatively similar to the one that he had a few days ago.  Additionally, he is not hypoxic here in the ED.  We discussed risk for benefits of declining CT scan.  Patient wishes to decline given that he just had one.  I feel that this is reasonable.  At this time, he has 2 - troponins, reassuring EKG and reassuring work-up.  Do not suspect that his symptoms are unstable angina, ACS etiology.  As we discussed, there may be a component of anxiety.  We will give him an ambulatory referral to cardiology.  He will also follow-up with his primary care doctor. At this time, patient exhibits no emergent life-threatening condition that require further evaluation in ED. Discussed patient with Dr. Langston Masker who is agreeable. Patient had ample opportunity for questions and discussion. All patient's questions were answered with full understanding. Strict return precautions discussed. Patient expresses understanding and agreement to plan.   Portions of this note were generated with Lobby[employer]. Dictation errors may occur despite best attempts at proofreading.   Final Clinical Impression(s) / ED Diagnoses Final diagnoses:  Nonspecific chest pain    Rx / DC Orders ED Discharge Orders    None       Desma Mcgregor 05/06/20 1610    Wyvonnia Dusky, MD 05/06/20 1651

## 2020-05-07 ENCOUNTER — Other Ambulatory Visit: Payer: Self-pay | Admitting: Urology

## 2020-05-07 DIAGNOSIS — C61 Malignant neoplasm of prostate: Secondary | ICD-10-CM

## 2020-05-08 ENCOUNTER — Encounter: Payer: Self-pay | Admitting: General Practice

## 2020-05-08 NOTE — Progress Notes (Signed)
[facility] Spiritual Care Note  Left voicemail per referral from prostate navigator Robin Bass/RN. Will try again next week.   [place], [place], [facility] Pager [phone] Voicemail [phone]

## 2020-05-11 ENCOUNTER — Encounter: Payer: Self-pay | Admitting: General Practice

## 2020-05-11 NOTE — Progress Notes (Signed)
[facility] Spiritual Care Note  Reached Jeremy Clark by phone for a pastoral check-in. He was appreciative of call and reports that overall he is doing well, understanding that grief bubbles up at different times, especially when people show care and when holidays approach. Also, he notes that his father died of prostate cancer. Jeremy Clark shared that he did Grief Share at his church when his daughter died and when his wife died, so he has tools for coping and making meaning. Praying and "keeping it moving" are two of his favorite coping strategies. He has several family members he could visit for Christmas, which also helps. Jeremy Clark is aware of ongoing chaplain availability for support, as well.   [place], [place], [facility] Pager [phone] Voicemail [phone]

## 2020-05-25 ENCOUNTER — Telehealth: Payer: Self-pay | Admitting: *Deleted

## 2020-05-25 NOTE — Telephone Encounter (Signed)
CALLED PATIENT TO INFORM THAT SIM APPT. HAS BEEN MOVED TO 06-05-20 @ 10:30 AM PER DR. MANNING'S PA (ASHLYN BRUNING REQUEST), LVM FOR A RETURN CALL

## 2020-06-04 ENCOUNTER — Ambulatory Visit: Payer: 59 | Admitting: Radiation Oncology

## 2020-06-05 ENCOUNTER — Other Ambulatory Visit: Payer: Self-pay

## 2020-06-05 ENCOUNTER — Ambulatory Visit
Admission: RE | Admit: 2020-06-05 | Discharge: 2020-06-05 | Disposition: A | Discharge status: Home / Self Care | Payer: 59 | Source: Ambulatory Visit | Attending: Radiation Oncology | Admitting: Radiation Oncology

## 2020-06-09 ENCOUNTER — Encounter: Payer: Self-pay | Admitting: Medical Oncology

## 2020-06-10 ENCOUNTER — Encounter: Payer: Self-pay | Admitting: Urology

## 2020-06-10 NOTE — Progress Notes (Signed)
Per communication from Dr. [name], he recently saw this gentleman to discuss definitive treatment of his prostate cancer and the patient is agreeable to undergo XRT with fiducial markers and SpaceOAR. The patient had initially refused FM/SO placement and was therefore scheduled for [facility] 06/05/20 in anticipation of proceeding with treatment without markers and SpaceOAR. However, he did not show for his [facility] appointment and in the interim has met back with Dr. [name] and is now in agreement to proceed with FM/SO so I have shard this new information with Romie Jumper and she will reach out to him to coordinate the procedure at [facility], prior to CT [facility]. [name], MMS, PA-C [place] at [place]: [phone]  Fax: [phone]

## 2020-06-11 NOTE — Progress Notes (Signed)
Spoke with patient to follow up prostate treatment. He cancelled his appointment for gold markers/SO. He states he got an infection post biopsy and cancelled the appointment. He originally did not want to have done but he has re discussed with Dr. [name], and he has encourage him to move forward with markers/SO. I will let Enid Derry know and she will contact him with new appointment. He voiced understanding. I left [employer].

## 2020-06-16 ENCOUNTER — Ambulatory Visit: Payer: 59

## 2020-06-17 ENCOUNTER — Ambulatory Visit: Payer: 59

## 2020-06-18 ENCOUNTER — Ambulatory Visit: Payer: 59

## 2020-06-19 ENCOUNTER — Ambulatory Visit: Payer: 59

## 2020-06-22 ENCOUNTER — Ambulatory Visit: Payer: 59

## 2020-06-23 ENCOUNTER — Ambulatory Visit: Payer: 59

## 2020-06-24 ENCOUNTER — Ambulatory Visit: Payer: 59

## 2020-06-25 ENCOUNTER — Ambulatory Visit: Payer: 59

## 2020-06-26 ENCOUNTER — Ambulatory Visit: Payer: 59

## 2020-06-29 ENCOUNTER — Ambulatory Visit: Payer: 59

## 2020-06-30 ENCOUNTER — Ambulatory Visit: Payer: 59

## 2020-07-01 ENCOUNTER — Ambulatory Visit: Payer: 59

## 2020-07-02 ENCOUNTER — Ambulatory Visit: Payer: 59

## 2020-07-03 ENCOUNTER — Ambulatory Visit: Payer: 59

## 2020-07-06 ENCOUNTER — Ambulatory Visit: Payer: 59

## 2020-07-07 ENCOUNTER — Ambulatory Visit: Payer: 59

## 2020-07-08 ENCOUNTER — Ambulatory Visit: Payer: 59

## 2020-07-09 ENCOUNTER — Ambulatory Visit: Payer: 59

## 2020-07-10 ENCOUNTER — Ambulatory Visit: Payer: 59

## 2020-07-13 ENCOUNTER — Telehealth: Payer: Self-pay | Admitting: Radiation Oncology

## 2020-07-13 ENCOUNTER — Ambulatory Visit: Payer: 59

## 2020-07-13 NOTE — Telephone Encounter (Signed)
Rec'd a message from the patient asking when he can start his CT SIM and rad appts. I have forwarded this message to [employer], PA and have called and left a message at [facility] to ask if he was cleared from the infection to start radiation.

## 2020-07-14 ENCOUNTER — Ambulatory Visit: Payer: 59

## 2020-07-14 ENCOUNTER — Telehealth: Payer: Self-pay | Admitting: *Deleted

## 2020-07-14 NOTE — Telephone Encounter (Signed)
Called patient to update about fid. marker and space oar placement, lvm for a return call

## 2020-07-15 ENCOUNTER — Ambulatory Visit: Payer: 59

## 2020-07-16 ENCOUNTER — Ambulatory Visit: Payer: 59

## 2020-07-16 ENCOUNTER — Other Ambulatory Visit: Payer: Self-pay

## 2020-07-16 ENCOUNTER — Ambulatory Visit (INDEPENDENT_AMBULATORY_CARE_PROVIDER_SITE_OTHER): Payer: 59 | Admitting: Podiatry

## 2020-07-16 ENCOUNTER — Telehealth: Payer: Self-pay | Admitting: *Deleted

## 2020-07-16 DIAGNOSIS — E118 Type 2 diabetes mellitus with unspecified complications: Secondary | ICD-10-CM

## 2020-07-16 DIAGNOSIS — Z794 Long term (current) use of insulin: Secondary | ICD-10-CM

## 2020-07-16 DIAGNOSIS — B351 Tinea unguium: Secondary | ICD-10-CM | POA: Diagnosis not present

## 2020-07-16 DIAGNOSIS — M79675 Pain in left toe(s): Secondary | ICD-10-CM | POA: Diagnosis not present

## 2020-07-16 DIAGNOSIS — M79674 Pain in right toe(s): Secondary | ICD-10-CM

## 2020-07-16 NOTE — Telephone Encounter (Signed)
CALLED PATIENT TO INFORM OF FID. MARKERS AND SPACE OAR ON 08-20-20 @ [facility] AND HIS SIM ON 08-25-20 @ DR. MANNING'S OFFICE, LVM FOR A RETURN CALL

## 2020-07-16 NOTE — Telephone Encounter (Signed)
Returned patient's phone call, spoke with patient 

## 2020-07-17 ENCOUNTER — Ambulatory Visit: Payer: 59

## 2020-07-19 ENCOUNTER — Encounter: Payer: Self-pay | Admitting: Podiatry

## 2020-07-19 NOTE — Progress Notes (Signed)
  Subjective:  Patient ID: Jeremy Clark, male    DOB: 11-Apr-1966,  MRN: [id number]  Chief Complaint  Patient presents with  . routine foot care    Nail trim     55 y.o. male returns with the above complaint. History confirmed with patient.  Doing well, he is on radiation treatment for prostate cancer  Objective:  Physical Exam: warm, good capillary refill, no trophic changes or ulcerative lesions, normal DP and PT pulses, normal sensory exam, onychomycosis x10 with subungual rate, yellow and brown elongated toenails with thinning of nail plates  Assessment:   1. Pain due to onychomycosis of toenails of both feet   2. Controlled type 2 diabetes mellitus with complication, with long-term current use of insulin ([place])      Plan:  Patient was evaluated and treated and all questions answered.  Patient educated on diabetes. Discussed proper diabetic foot care and discussed risks and complications of disease. Educated patient in depth on reasons to return to the office immediately should he/she discover anything concerning or new on the feet. All questions answered. Discussed proper shoes as well.    Discussed the etiology and treatment options for the condition in detail with the patient. Educated patient on the topical and oral treatment options for mycotic nails. Recommended debridement of the nails today. Sharp and mechanical debridement performed of all painful and mycotic nails today. Nails debrided in length and thickness using a nail nipper to level of comfort. Discussed treatment options including appropriate shoe gear. Follow up as needed for painful nails.     Return in about 3 months (around 10/13/2020).

## 2020-07-20 ENCOUNTER — Ambulatory Visit: Payer: 59

## 2020-07-21 ENCOUNTER — Ambulatory Visit: Payer: 59

## 2020-07-22 ENCOUNTER — Ambulatory Visit: Payer: 59

## 2020-07-23 ENCOUNTER — Ambulatory Visit: Payer: 59

## 2020-08-06 ENCOUNTER — Emergency Department (HOSPITAL_BASED_OUTPATIENT_CLINIC_OR_DEPARTMENT_OTHER)
Admission: EM | Admit: 2020-08-06 | Discharge: 2020-08-06 | Disposition: A | Discharge status: Home / Self Care | Payer: 59 | Attending: Emergency Medicine | Admitting: Emergency Medicine

## 2020-08-06 ENCOUNTER — Encounter (HOSPITAL_BASED_OUTPATIENT_CLINIC_OR_DEPARTMENT_OTHER): Payer: Self-pay | Admitting: Emergency Medicine

## 2020-08-06 ENCOUNTER — Emergency Department (HOSPITAL_BASED_OUTPATIENT_CLINIC_OR_DEPARTMENT_OTHER): Payer: 59

## 2020-08-06 ENCOUNTER — Other Ambulatory Visit: Payer: Self-pay

## 2020-08-06 DIAGNOSIS — Z8546 Personal history of malignant neoplasm of prostate: Secondary | ICD-10-CM | POA: Insufficient documentation

## 2020-08-06 DIAGNOSIS — E119 Type 2 diabetes mellitus without complications: Secondary | ICD-10-CM | POA: Diagnosis not present

## 2020-08-06 DIAGNOSIS — Z79899 Other long term (current) drug therapy: Secondary | ICD-10-CM | POA: Insufficient documentation

## 2020-08-06 DIAGNOSIS — R079 Chest pain, unspecified: Secondary | ICD-10-CM | POA: Diagnosis not present

## 2020-08-06 DIAGNOSIS — R0602 Shortness of breath: Secondary | ICD-10-CM | POA: Diagnosis present

## 2020-08-06 DIAGNOSIS — R600 Localized edema: Secondary | ICD-10-CM | POA: Insufficient documentation

## 2020-08-06 DIAGNOSIS — Z87891 Personal history of nicotine dependence: Secondary | ICD-10-CM | POA: Diagnosis not present

## 2020-08-06 DIAGNOSIS — Z7984 Long term (current) use of oral hypoglycemic drugs: Secondary | ICD-10-CM | POA: Insufficient documentation

## 2020-08-06 DIAGNOSIS — I10 Essential (primary) hypertension: Secondary | ICD-10-CM | POA: Diagnosis not present

## 2020-08-06 DIAGNOSIS — Z794 Long term (current) use of insulin: Secondary | ICD-10-CM | POA: Insufficient documentation

## 2020-08-06 HISTORY — DX: Unspecified abdominal hernia without obstruction or gangrene: K46.9

## 2020-08-06 LAB — BASIC METABOLIC PANEL
Anion gap: 10 (ref 5–15)
BUN: 13 mg/dL (ref 6–20)
CO2: 21 mmol/L — ABNORMAL LOW (ref 22–32)
Calcium: 8.7 mg/dL — ABNORMAL LOW (ref 8.9–10.3)
Chloride: 106 mmol/L (ref 98–111)
Creatinine, Ser: 1.05 mg/dL (ref 0.61–1.24)
GFR, Estimated: >60 mL/min (ref >60)
Glucose, Bld: 131 mg/dL — ABNORMAL HIGH (ref 70–99)
Potassium: 4.1 mmol/L (ref 3.5–5.1)
Sodium: 137 mmol/L (ref 135–145)

## 2020-08-06 LAB — TROPONIN I (HIGH SENSITIVITY)
Troponin I (High Sensitivity): 4 ng/L (ref <18)
Troponin I (High Sensitivity): 3 ng/L (ref <18)

## 2020-08-06 LAB — CBC
HCT: 42.1 % (ref 39.0–52.0)
Hemoglobin: 14.6 g/dL (ref 13.0–17.0)
MCH: 31.9 pg (ref 26.0–34.0)
MCHC: 34.7 g/dL (ref 30.0–36.0)
MCV: 91.9 fL (ref 80.0–100.0)
Platelets: 226 10*3/uL (ref 150–400)
RBC: 4.58 MIL/uL (ref 4.22–5.81)
RDW: 13.6 % (ref 11.5–15.5)
WBC: 3.9 10*3/uL — ABNORMAL LOW (ref 4.0–10.5)
nRBC: 0 % (ref 0.0–0.2)

## 2020-08-06 LAB — BRAIN NATRIURETIC PEPTIDE: B Natriuretic Peptide: 14 pg/mL (ref 0.0–100.0)

## 2020-08-06 LAB — D-DIMER, QUANTITATIVE: D-Dimer, Quant: 3.21 ug/mL-FEU — ABNORMAL HIGH (ref 0.00–0.50)

## 2020-08-06 MED ORDER — IOHEXOL 350 MG/ML SOLN
100.0000 mL | Freq: Once | INTRAVENOUS | Status: AC | PRN
  Administered 2020-08-06: 100 mL via INTRAVENOUS

## 2020-08-06 NOTE — ED Notes (Signed)
Pt ambulated to restroom. 

## 2020-08-06 NOTE — ED Triage Notes (Signed)
Pt having chest pressure and sob for 2 days.  Pt states he was laying down at the time.  Pt states he has hard time getting a good breath.  No known fever.  Some coughing, non productive.

## 2020-08-06 NOTE — ED Provider Notes (Signed)
[facility] EMERGENCY DEPARTMENT Provider Note   CSN: [id number] Arrival date & time: 08/06/20  4854     History Chief Complaint  Patient presents with  . Shortness of Breath    Jeremy Clark is a 55 y.o. male.  55 year old male with history of DM, sleep apnea, HTN, prostate cancer (scheduled to start radiation at the end of this month).  Presents with complaint of shortness of breath onset yesterday with exertion, improves with rest, returned this morning with activity which prompted him to come to the emergency room.  Reports left-sided chest pressure, intermittent, not having any pressure at this time resting in bed.  Denies diaphoresis, nausea, vomiting, changes in lower extremity edema.  No history of heart failure, denies cardiac history.  Patient is a former smoker.  No significant family cardiac history. Reports similar symptoms in the past, worked up in the ER and with cardiology without cause found.        Past Medical History:  Diagnosis Date  . Abdominal hernia   . Diabetes mellitus   . High cholesterol   . Hypertension   . Prostate cancer [facility])     Patient Active Problem List   Diagnosis Date Noted  . Elevated PSA 01/01/2020  . Prostate mass 01/01/2020  . Prostate cancer ([place]) 01/01/2020  . Vitamin D deficiency 11/08/2019  . Controlled type 2 diabetes mellitus with complication, with long-term current use of insulin ([place]) 10/15/2018  . Essential hypertension 10/15/2018  . Hyperlipidemia 10/15/2018  . Abdominal wall hernia 03/19/2015  . Morbid obesity with BMI of 50.0-59.9, adult ([place]) 03/27/2014    Past Surgical History:  Procedure Laterality Date  . PROSTATE BIOPSY    . SHOULDER SURGERY     right       Family History  Problem Relation Age of Onset  . Hypertension Mother   . Prostate cancer Father   . Aneurysm Daughter   . Colon cancer Neg Hx   . Pancreatic cancer Neg Hx   . Breast cancer Neg Hx     Social History   Tobacco Use   . Smoking status: Former Smoker    Packs/day: 0.50    Years: 20.00    Pack years: 10.00    Quit date: 05/31/2003    Years since quitting: 17.1  . Smokeless tobacco: Never Used  Vaping Use  . Vaping Use: Never used  Substance Use Topics  . Alcohol use: Not Currently  . Drug use: No    Home Medications Prior to Admission medications   Medication Sig Start Date End Date Taking? Authorizing Provider  busPIRone (BUSPAR) 10 MG tablet Take 10 mg by mouth 2 (two) times daily.    [provider]  Canagliflozin-metFORMIN HCl (INVOKAMET) [phone] MG TABS Take by mouth. 04/30/20   [provider]  lisinopril (PRINIVIL,ZESTRIL) 30 MG tablet Take 30 mg by mouth daily.    [provider]  simvastatin (ZOCOR) 20 MG tablet Take 20 mg by mouth daily.    [provider]  tamsulosin (FLOMAX) 0.4 MG CAPS capsule Take 0.4 mg by mouth daily. 03/29/20   [provider]    Allergies    Doxycycline  Review of Systems   Review of Systems  Constitutional: Negative for chills, diaphoresis and fever.  Respiratory: Positive for shortness of breath. Negative for cough and wheezing.   Cardiovascular: Positive for chest pain.  Gastrointestinal: Negative for abdominal pain, nausea and vomiting.  Genitourinary: Negative for decreased urine volume.  Musculoskeletal: Negative for back pain.  Skin: Negative for rash and wound.  Allergic/Immunologic: Positive for immunocompromised state.  Neurological: Negative for weakness.  Psychiatric/Behavioral: Negative for confusion.  All other systems reviewed and are negative.   Physical Exam Updated Vital Signs BP (!) 140/92   Pulse 75   Temp 98.6 F (37 C) (Oral)   Resp 18   Ht 5\' 4"  (1.626 m)   Wt (!) 137.9 kg   SpO2 100%   BMI 52.18 kg/m   Physical Exam Vitals and nursing note reviewed.  Constitutional:      General: He is not in acute distress.    Appearance: He is well-developed. He is obese. He is not  diaphoretic.  HENT:     Head: Normocephalic and atraumatic.  Cardiovascular:     Rate and Rhythm: Normal rate and regular rhythm.  Pulmonary:     Effort: Pulmonary effort is normal. Tachypnea present.     Breath sounds: Normal breath sounds. No decreased breath sounds.  Chest:     Chest wall: No tenderness.  Abdominal:     Palpations: Abdomen is soft.     Tenderness: There is no abdominal tenderness.  Musculoskeletal:     Right lower leg: Edema present.     Left lower leg: Edema present.  Skin:    General: Skin is warm and dry.     Findings: No erythema or rash.  Neurological:     Mental Status: He is alert and oriented to person, place, and time.  Psychiatric:        Behavior: Behavior normal.     ED Results / Procedures / Treatments   Labs (all labs ordered are listed, but only abnormal results are displayed) Labs Reviewed  BASIC METABOLIC PANEL - Abnormal; Notable for the following components:      Result Value   CO2 21 (*)    Glucose, Bld 131 (*)    Calcium 8.7 (*)    All other components within normal limits  CBC - Abnormal; Notable for the following components:   WBC 3.9 (*)    All other components within normal limits  D-DIMER, QUANTITATIVE - Abnormal; Notable for the following components:   D-Dimer, Quant 3.21 (*)    All other components within normal limits  BRAIN NATRIURETIC PEPTIDE  TROPONIN I (HIGH SENSITIVITY)  TROPONIN I (HIGH SENSITIVITY)    EKG EKG Interpretation  Date/Time:  Thursday August 06 2020 09:48:20 EST Ventricular Rate:  79 PR Interval:  146 QRS Duration: 78 QT Interval:  364 QTC Calculation: 417 R Axis:   48 Text Interpretation: Normal sinus rhythm Normal ECG Confirmed by Davonna Belling [phone]) on 08/06/2020 1:37:59 PM   Radiology DG Chest 2 View  Result Date: 08/06/2020 CLINICAL DATA:  55 year old male with chest pressure and shortness of breath for 2 days. EXAM: CHEST - 2 VIEW COMPARISON:  Chest radiographs 05/06/2020 and  earlier. FINDINGS: Chronically somewhat low lung volumes. Mediastinal contours remain normal. Visualized tracheal air column is within normal limits. Lung markings are stable and both lungs appear clear. No pneumothorax or pleural effusion. No acute osseous abnormality identified. Negative visible bowel gas. IMPRESSION: Chronically low lung volumes.  No cardiopulmonary abnormality. Electronically Signed   By: Genevie Ann M.D.   On: 08/06/2020 10:49   CT Angio Chest PE W/Cm &/Or Wo Cm  Result Date: 08/06/2020 CLINICAL DATA:  Chest pressure and shortness of breath EXAM: CT ANGIOGRAPHY CHEST WITH CONTRAST TECHNIQUE: Multidetector CT imaging of the chest was performed  using the standard protocol during bolus administration of intravenous contrast. Multiplanar CT image reconstructions and MIPs were obtained to evaluate the vascular anatomy. CONTRAST:  198mL OMNIPAQUE IOHEXOL 350 MG/ML SOLN COMPARISON:  None. FINDINGS: Cardiovascular: There is slightly suboptimal opacification of the main pulmonary artery, however no central or proximal segmental pulmonary embolism. The heart is normal in size. No pericardial effusion or thickening. No evidence right heart strain. There is normal three-vessel brachiocephalic anatomy without proximal stenosis. The thoracic aorta is normal in appearance. Mediastinum/Nodes: No hilar, mediastinal, or axillary adenopathy. Thyroid gland, trachea, and esophagus demonstrate no significant findings. Lungs/Pleura: The lungs are clear. No pleural effusion or pneumothorax. No airspace consolidation. Upper Abdomen: No acute abnormalities present in the visualized portions of the upper abdomen. Partially visualized anterior umbilical hernia with: Is noted. There is diffuse low density seen throughout the liver parenchyma. Nodular thickening of the bilateral adrenal glands is seen. Musculoskeletal: No chest wall abnormality. No acute or significant osseous findings. Anterior flowing osteophytes seen in  the midthoracic spine. Review of the MIP images confirms the above findings. IMPRESSION: Slightly suboptimal opacification of the main pulmonary artery, however no central or proximal segmental pulmonary embolism. No other acute intrathoracic pathology to explain the patient's symptoms. Electronically Signed   By: Prudencio Pair M.D.   On: 08/06/2020 13:26    Procedures Procedures   Medications Ordered in ED Medications  iohexol (OMNIPAQUE) 350 MG/ML injection 100 mL (100 mLs Intravenous Contrast Given 08/06/20 1225)    ED Course  I have reviewed the triage vital signs and the nursing notes.  Pertinent labs & imaging results that were available during my care of the patient were reviewed by me and considered in my medical decision making (see chart for details).  Clinical Course as of 08/06/20 1443  Thu Aug 07, 8171  3270 55 year old male with complaint of shortness of breath as above.  On exam is mildly tachypneic however able to speak in complete sentences without difficulty.  Body habitus limits exam however lungs appear clear.  Mild lower extremity pitting edema which he states is not significantly changed from baseline.  [LM]  1441 CBC without significant findings, BMP without significant findings.  Troponin x2 unremarkable.  BNP is 14.  Patient's D-dimer is elevated at 3.21 which is higher than last time.  He did have a CTA of his chest in December which was negative for PE however limited by body habitus.  Due to his increase in D-dimer with persistent shortness of breath, will evaluate further with CTA chest today.  CT chest is negative for PE or other acute findings.  Discussed test results and plan of care with patient who plans to follow-up with his PCP.  Patient states that he is followed by cardiology and is scheduled to see rheumatology as well.  Patient states that he has recently lost his wife and his daughter as well as his favorite uncle, his PCP has told him that his shortness  of breath may actually be related to anxiety. [LM]    Clinical Course User Index [LM] Roque Lias   MDM Rules/Calculators/A&P                          Final Clinical Impression(s) / ED Diagnoses Final diagnoses:  SOB (shortness of breath)    Rx / DC Orders ED Discharge Orders    None       Tacy Learn, PA-C 08/06/20 1443  Davonna Belling, MD 08/06/20 1452

## 2020-08-06 NOTE — Discharge Instructions (Addendum)
Follow-up with your providers as discussed.  Return to the emergency room for worsening or concerning symptoms.

## 2020-08-24 ENCOUNTER — Telehealth: Payer: Self-pay | Admitting: *Deleted

## 2020-08-24 NOTE — Telephone Encounter (Signed)
Called patient to remind of sim appt. for 08-25-20- arrival time- 10:15 am @ [place], lvm for a return call

## 2020-08-25 ENCOUNTER — Other Ambulatory Visit: Payer: Self-pay

## 2020-08-25 ENCOUNTER — Ambulatory Visit
Admission: RE | Admit: 2020-08-25 | Discharge: 2020-08-25 | Disposition: A | Discharge status: Home / Self Care | Payer: 59 | Source: Ambulatory Visit | Attending: Radiation Oncology | Admitting: Radiation Oncology

## 2020-08-25 ENCOUNTER — Encounter: Payer: Self-pay | Admitting: Medical Oncology

## 2020-08-25 DIAGNOSIS — C61 Malignant neoplasm of prostate: Secondary | ICD-10-CM | POA: Diagnosis present

## 2020-08-25 NOTE — Progress Notes (Signed)
  Radiation Oncology         [phone]) [phone] ________________________________  Name: Jeremy Clark MRN: [id number]  Date: 08/25/2020  DOB: Jan 16, 1966  SIMULATION AND TREATMENT PLANNING NOTE    ICD-10-CM   1. Prostate cancer ([place])  C61     DIAGNOSIS:  55 y.o. gentleman with Stage T1c adenocarcinoma of the prostate with Gleason Score of 3+4, and PSA of 11.8.  NARRATIVE:  The patient was brought to the [facility].  Identity was confirmed.  All relevant records and images related to the planned course of therapy were reviewed.  The patient freely provided informed written consent to proceed with treatment after reviewing the details related to the planned course of therapy. The consent form was witnessed and verified by the simulation staff.  Then, the patient was set-up in a stable reproducible supine position for radiation therapy.  A vacuum lock pillow device was custom fabricated to position his legs in a reproducible immobilized position.  Then, I performed a urethrogram under sterile conditions to identify the prostatic apex.  CT images were obtained.  Surface markings were placed.  The CT images were loaded into the planning software.  Then the prostate target and avoidance structures including the rectum, bladder, bowel and hips were contoured.  Treatment planning then occurred.  The radiation prescription was entered and confirmed.  A total of one complex treatment devices was fabricated. I have requested : Intensity Modulated Radiotherapy (IMRT) is medically necessary for this case for the following reason:  Rectal sparing.Marland Kitchen  PLAN:  The patient will receive 70 Gy in 28 fractions.  ________________________________  Sheral Apley Tammi Klippel, M.D.  This document serves as a record of services personally performed by Tyler Pita, MD. It was created on his behalf by Wilburn Mylar, a trained medical scribe. The creation of this record is based on the scribe's personal  observations and the provider's statements to them. This document has been checked and approved by the attending provider.

## 2020-08-31 DIAGNOSIS — C61 Malignant neoplasm of prostate: Secondary | ICD-10-CM | POA: Insufficient documentation

## 2020-09-03 ENCOUNTER — Ambulatory Visit
Admission: RE | Admit: 2020-09-03 | Discharge: 2020-09-03 | Disposition: A | Discharge status: Home / Self Care | Payer: 59 | Source: Ambulatory Visit | Attending: Radiation Oncology | Admitting: Radiation Oncology

## 2020-09-03 ENCOUNTER — Encounter: Payer: Self-pay | Admitting: Radiation Oncology

## 2020-09-03 ENCOUNTER — Other Ambulatory Visit: Payer: Self-pay

## 2020-09-03 DIAGNOSIS — C61 Malignant neoplasm of prostate: Secondary | ICD-10-CM | POA: Diagnosis not present

## 2020-09-03 NOTE — Progress Notes (Signed)
Received voicemail from patient regarding assistance with gas cards.  Forwarded message to Adriene in Radiation.

## 2020-09-04 ENCOUNTER — Ambulatory Visit
Admission: RE | Admit: 2020-09-04 | Discharge: 2020-09-04 | Disposition: A | Discharge status: Home / Self Care | Payer: 59 | Source: Ambulatory Visit | Attending: Radiation Oncology | Admitting: Radiation Oncology

## 2020-09-04 DIAGNOSIS — C61 Malignant neoplasm of prostate: Secondary | ICD-10-CM | POA: Diagnosis not present

## 2020-09-04 NOTE — Progress Notes (Signed)
Complete post sim education with patient today. Oriented patient to staff and routine of the clinic. Provided patient with RADIATION THERAPY AND YOU handbook then, reviewed pertinent information. Educated patient reference potential side effects and management such as fatigue, diarrhea and urinary/bladder changes. Answered all patient questions to the best of my ability. Provided patient with my business card and encouraged him to call with future needs. Patient verbalized understanding of all reviewed.  

## 2020-09-07 ENCOUNTER — Ambulatory Visit: Payer: 59

## 2020-09-08 ENCOUNTER — Other Ambulatory Visit: Payer: Self-pay

## 2020-09-08 ENCOUNTER — Ambulatory Visit
Admission: RE | Admit: 2020-09-08 | Discharge: 2020-09-08 | Disposition: A | Discharge status: Home / Self Care | Payer: 59 | Source: Ambulatory Visit | Attending: Radiation Oncology | Admitting: Radiation Oncology

## 2020-09-08 DIAGNOSIS — C61 Malignant neoplasm of prostate: Secondary | ICD-10-CM | POA: Diagnosis not present

## 2020-09-09 ENCOUNTER — Other Ambulatory Visit: Payer: Self-pay

## 2020-09-09 ENCOUNTER — Ambulatory Visit: Admission: RE | Admit: 2020-09-09 | Payer: 59 | Source: Ambulatory Visit

## 2020-09-10 ENCOUNTER — Ambulatory Visit
Admission: RE | Admit: 2020-09-10 | Discharge: 2020-09-10 | Disposition: A | Discharge status: Home / Self Care | Payer: 59 | Source: Ambulatory Visit | Attending: Radiation Oncology | Admitting: Radiation Oncology

## 2020-09-10 ENCOUNTER — Other Ambulatory Visit: Payer: Self-pay

## 2020-09-10 DIAGNOSIS — C61 Malignant neoplasm of prostate: Secondary | ICD-10-CM | POA: Diagnosis not present

## 2020-09-11 ENCOUNTER — Ambulatory Visit
Admission: RE | Admit: 2020-09-11 | Discharge: 2020-09-11 | Disposition: A | Discharge status: Home / Self Care | Payer: 59 | Source: Ambulatory Visit | Attending: Radiation Oncology | Admitting: Radiation Oncology

## 2020-09-11 DIAGNOSIS — C61 Malignant neoplasm of prostate: Secondary | ICD-10-CM | POA: Diagnosis not present

## 2020-09-14 ENCOUNTER — Other Ambulatory Visit: Payer: Self-pay

## 2020-09-14 ENCOUNTER — Ambulatory Visit
Admission: RE | Admit: 2020-09-14 | Discharge: 2020-09-14 | Disposition: A | Discharge status: Home / Self Care | Payer: 59 | Source: Ambulatory Visit | Attending: Radiation Oncology | Admitting: Radiation Oncology

## 2020-09-14 DIAGNOSIS — C61 Malignant neoplasm of prostate: Secondary | ICD-10-CM | POA: Diagnosis not present

## 2020-09-15 ENCOUNTER — Ambulatory Visit
Admission: RE | Admit: 2020-09-15 | Discharge: 2020-09-15 | Disposition: A | Discharge status: Home / Self Care | Payer: 59 | Source: Ambulatory Visit | Attending: Radiation Oncology | Admitting: Radiation Oncology

## 2020-09-15 DIAGNOSIS — C61 Malignant neoplasm of prostate: Secondary | ICD-10-CM | POA: Diagnosis not present

## 2020-09-16 ENCOUNTER — Other Ambulatory Visit: Payer: Self-pay

## 2020-09-16 ENCOUNTER — Ambulatory Visit
Admission: RE | Admit: 2020-09-16 | Discharge: 2020-09-16 | Disposition: A | Discharge status: Home / Self Care | Payer: 59 | Source: Ambulatory Visit | Attending: Radiation Oncology | Admitting: Radiation Oncology

## 2020-09-16 DIAGNOSIS — C61 Malignant neoplasm of prostate: Secondary | ICD-10-CM | POA: Diagnosis not present

## 2020-09-17 ENCOUNTER — Ambulatory Visit
Admission: RE | Admit: 2020-09-17 | Discharge: 2020-09-17 | Disposition: A | Discharge status: Home / Self Care | Payer: 59 | Source: Ambulatory Visit | Attending: Radiation Oncology | Admitting: Radiation Oncology

## 2020-09-17 DIAGNOSIS — C61 Malignant neoplasm of prostate: Secondary | ICD-10-CM | POA: Diagnosis not present

## 2020-09-18 ENCOUNTER — Emergency Department (HOSPITAL_BASED_OUTPATIENT_CLINIC_OR_DEPARTMENT_OTHER): Payer: 59

## 2020-09-18 ENCOUNTER — Emergency Department (HOSPITAL_BASED_OUTPATIENT_CLINIC_OR_DEPARTMENT_OTHER)
Admission: EM | Admit: 2020-09-18 | Discharge: 2020-09-18 | Disposition: A | Discharge status: Home / Self Care | Payer: 59 | Attending: Emergency Medicine | Admitting: Emergency Medicine

## 2020-09-18 ENCOUNTER — Encounter (HOSPITAL_BASED_OUTPATIENT_CLINIC_OR_DEPARTMENT_OTHER): Payer: Self-pay | Admitting: Emergency Medicine

## 2020-09-18 ENCOUNTER — Ambulatory Visit
Admission: RE | Admit: 2020-09-18 | Discharge: 2020-09-18 | Disposition: A | Discharge status: Home / Self Care | Payer: 59 | Source: Ambulatory Visit | Attending: Radiation Oncology | Admitting: Radiation Oncology

## 2020-09-18 ENCOUNTER — Other Ambulatory Visit (HOSPITAL_BASED_OUTPATIENT_CLINIC_OR_DEPARTMENT_OTHER): Payer: Self-pay

## 2020-09-18 ENCOUNTER — Other Ambulatory Visit: Payer: Self-pay

## 2020-09-18 DIAGNOSIS — J029 Acute pharyngitis, unspecified: Secondary | ICD-10-CM | POA: Insufficient documentation

## 2020-09-18 DIAGNOSIS — Z794 Long term (current) use of insulin: Secondary | ICD-10-CM | POA: Insufficient documentation

## 2020-09-18 DIAGNOSIS — Z79899 Other long term (current) drug therapy: Secondary | ICD-10-CM | POA: Diagnosis not present

## 2020-09-18 DIAGNOSIS — Z8546 Personal history of malignant neoplasm of prostate: Secondary | ICD-10-CM | POA: Insufficient documentation

## 2020-09-18 DIAGNOSIS — I10 Essential (primary) hypertension: Secondary | ICD-10-CM | POA: Diagnosis not present

## 2020-09-18 DIAGNOSIS — E1169 Type 2 diabetes mellitus with other specified complication: Secondary | ICD-10-CM | POA: Insufficient documentation

## 2020-09-18 DIAGNOSIS — Z87891 Personal history of nicotine dependence: Secondary | ICD-10-CM | POA: Insufficient documentation

## 2020-09-18 DIAGNOSIS — E785 Hyperlipidemia, unspecified: Secondary | ICD-10-CM | POA: Insufficient documentation

## 2020-09-18 DIAGNOSIS — C61 Malignant neoplasm of prostate: Secondary | ICD-10-CM | POA: Diagnosis not present

## 2020-09-18 LAB — COMPREHENSIVE METABOLIC PANEL
ALT: 49 U/L — ABNORMAL HIGH (ref 0–44)
AST: 79 U/L — ABNORMAL HIGH (ref 15–41)
Albumin: 3.5 g/dL (ref 3.5–5.0)
Alkaline Phosphatase: 86 U/L (ref 38–126)
Anion gap: 16 — ABNORMAL HIGH (ref 5–15)
BUN: 5 mg/dL — ABNORMAL LOW (ref 6–20)
CO2: 19 mmol/L — ABNORMAL LOW (ref 22–32)
Calcium: 8.4 mg/dL — ABNORMAL LOW (ref 8.9–10.3)
Chloride: 102 mmol/L (ref 98–111)
Creatinine, Ser: 0.88 mg/dL (ref 0.61–1.24)
GFR, Estimated: >60 mL/min (ref >60)
Glucose, Bld: 115 mg/dL — ABNORMAL HIGH (ref 70–99)
Potassium: 3 mmol/L — ABNORMAL LOW (ref 3.5–5.1)
Sodium: 137 mmol/L (ref 135–145)
Total Bilirubin: 0.6 mg/dL (ref 0.3–1.2)
Total Protein: 7.7 g/dL (ref 6.5–8.1)

## 2020-09-18 LAB — CBC WITH DIFFERENTIAL/PLATELET
Abs Immature Granulocytes: 0.01 10*3/uL (ref 0.00–0.07)
Basophils Absolute: 0 10*3/uL (ref 0.0–0.1)
Basophils Relative: 0 %
Eosinophils Absolute: 0 10*3/uL (ref 0.0–0.5)
Eosinophils Relative: 1 %
HCT: 39.2 % (ref 39.0–52.0)
Hemoglobin: 14 g/dL (ref 13.0–17.0)
Immature Granulocytes: 0 %
Lymphocytes Relative: 20 %
Lymphs Abs: 0.6 10*3/uL — ABNORMAL LOW (ref 0.7–4.0)
MCH: 32 pg (ref 26.0–34.0)
MCHC: 35.7 g/dL (ref 30.0–36.0)
MCV: 89.7 fL (ref 80.0–100.0)
Monocytes Absolute: 0.4 10*3/uL (ref 0.1–1.0)
Monocytes Relative: 13 %
Neutro Abs: 1.9 10*3/uL (ref 1.7–7.7)
Neutrophils Relative %: 66 %
Platelets: 180 10*3/uL (ref 150–400)
RBC: 4.37 MIL/uL (ref 4.22–5.81)
RDW: 14 % (ref 11.5–15.5)
WBC: 2.9 10*3/uL — ABNORMAL LOW (ref 4.0–10.5)
nRBC: 0 % (ref 0.0–0.2)

## 2020-09-18 LAB — GROUP A STREP BY PCR: Group A Strep by PCR: NOT DETECTED

## 2020-09-18 MED ORDER — LIDOCAINE VISCOUS HCL 2 % MT SOLN
15.0000 mL | Freq: Once | OROMUCOSAL | Status: AC
  Administered 2020-09-18: 15 mL via OROMUCOSAL
  Filled 2020-09-18: qty 15

## 2020-09-18 MED ORDER — LIDOCAINE VISCOUS HCL 2 % MT SOLN
15.0000 mL | OROMUCOSAL | 0 refills | Status: DC | PRN
  Filled 2020-09-18: qty 500, 6d supply, fill #0

## 2020-09-18 MED ORDER — IOHEXOL 300 MG/ML  SOLN
75.0000 mL | Freq: Once | INTRAMUSCULAR | Status: AC | PRN
  Administered 2020-09-18: 75 mL via INTRAVENOUS

## 2020-09-18 MED ORDER — METHYLPREDNISOLONE SODIUM SUCC 125 MG IJ SOLR
125.0000 mg | Freq: Once | INTRAMUSCULAR | Status: AC
  Administered 2020-09-18: 125 mg via INTRAVENOUS
  Filled 2020-09-18: qty 2

## 2020-09-18 MED ORDER — SODIUM CHLORIDE 0.9 % IV SOLN: INTRAVENOUS | Status: DC | PRN

## 2020-09-18 MED ORDER — CLINDAMYCIN PHOSPHATE 600 MG/50ML IV SOLN
600.0000 mg | Freq: Once | INTRAVENOUS | Status: AC
  Administered 2020-09-18: 600 mg via INTRAVENOUS
  Filled 2020-09-18: qty 50

## 2020-09-18 MED ORDER — DEXAMETHASONE 0.5 MG/5ML PO SOLN: 4.0000 mg | Freq: Two times a day (BID) | ORAL | 0 refills | Status: AC

## 2020-09-18 MED ORDER — CLINDAMYCIN PALMITATE HCL 75 MG/5ML PO SOLR: 300.0000 mg | Freq: Three times a day (TID) | ORAL | 0 refills | Status: AC

## 2020-09-18 NOTE — ED Provider Notes (Signed)
[facility] EMERGENCY DEPARTMENT Provider Note   CSN: [id number] Arrival date & time: 09/18/20  0459     History Chief Complaint  Patient presents with  . Sore Throat    Jeremy Clark is a 55 y.o. male.  55 year old male with history of hernia, diabetes, hyperlipidemia, hypertension prostate cancer currently being treated with directed radiotherapy.  Patient states that over the last few days he has had a sore throat.  But over last 24 hours he has had worsening sore throat associated with difficulty swallowing things secondary to the pain.  The point were he is oftentimes coughing up and spit out even his saliva.  States his voice is a bit more hoarse than normal.  In between coughing fits his throat kind of hurts but does not have any drooling or other abnormalities.  No history of the same.  No fevers.  No diarrhea or constipation.  No sick contacts.  No rashes.   Sore Throat       Past Medical History:  Diagnosis Date  . Abdominal hernia   . Diabetes mellitus   . High cholesterol   . Hypertension   . Prostate cancer [facility])     Patient Active Problem List   Diagnosis Date Noted  . Elevated PSA 01/01/2020  . Prostate mass 01/01/2020  . Prostate cancer ([place]) 01/01/2020  . Vitamin D deficiency 11/08/2019  . Controlled type 2 diabetes mellitus with complication, with long-term current use of insulin ([place]) 10/15/2018  . Essential hypertension 10/15/2018  . Hyperlipidemia 10/15/2018  . Abdominal wall hernia 03/19/2015  . Morbid obesity with BMI of 50.0-59.9, adult ([place]) 03/27/2014    Past Surgical History:  Procedure Laterality Date  . PROSTATE BIOPSY    . SHOULDER SURGERY     right       Family History  Problem Relation Age of Onset  . Hypertension Mother   . Prostate cancer Father   . Aneurysm Daughter   . Colon cancer Neg Hx   . Pancreatic cancer Neg Hx   . Breast cancer Neg Hx     Social History   Tobacco Use  . Smoking status: Former  Smoker    Packs/day: 0.50    Years: 20.00    Pack years: 10.00    Quit date: 05/31/2003    Years since quitting: 17.3  . Smokeless tobacco: Never Used  Vaping Use  . Vaping Use: Never used  Substance Use Topics  . Alcohol use: Yes    Comment: occ  . Drug use: No    Home Medications Prior to Admission medications   Medication Sig Start Date End Date Taking? Authorizing Provider  clindamycin (CLEOCIN) 75 MG/5ML solution Take 20 mLs (300 mg total) by mouth 3 (three) times daily for 10 days. 09/18/20 09/28/20 Yes Mechele Kittleson, Corene Cornea, MD  dexamethasone (DECADRON) 0.5 MG/5ML solution Take 40 mLs (4 mg total) by mouth 2 (two) times daily for 7 days. 09/18/20 09/25/20 Yes Jaryn Hocutt, Corene Cornea, MD  busPIRone (BUSPAR) 10 MG tablet Take 10 mg by mouth 2 (two) times daily.    [provider]  Canagliflozin-metFORMIN HCl (INVOKAMET) [phone] MG TABS Take by mouth. 04/30/20   [provider]  lisinopril (PRINIVIL,ZESTRIL) 30 MG tablet Take 30 mg by mouth daily.    [provider]  simvastatin (ZOCOR) 20 MG tablet Take 20 mg by mouth daily.    [provider]  tamsulosin (FLOMAX) 0.4 MG CAPS capsule Take 0.4 mg by mouth daily. 03/29/20  [provider]    Allergies    Doxycycline  Review of Systems   Review of Systems  All other systems reviewed and are negative.   Physical Exam Updated Vital Signs BP (!) 148/85 (BP Location: Right Arm)   Pulse 83   Temp 98.5 F (36.9 C) (Oral)   Resp 18   Ht 5\' 4"  (1.626 m)   Wt 129.7 kg   SpO2 100%   BMI 49.09 kg/m   Physical Exam Vitals and nursing note reviewed.  Constitutional:      Appearance: He is well-developed.  HENT:     Head: Normocephalic and atraumatic.     Comments: Has pretty significant tenderness to the left side of his throat on palpation.  Feels a little bit firm there and underneath his chin.    Mouth/Throat:     Mouth: Mucous membranes are moist.     Comments: Posterior erythema with mild uvular  and tonsillar edema.  No obvious exudate Cardiovascular:     Rate and Rhythm: Normal rate.  Pulmonary:     Effort: Pulmonary effort is normal. No respiratory distress.  Abdominal:     General: There is no distension.  Musculoskeletal:        General: Normal range of motion.     Cervical back: Normal range of motion.  Neurological:     Mental Status: He is alert.     ED Results / Procedures / Treatments   Labs (all labs ordered are listed, but only abnormal results are displayed) Labs Reviewed  CBC WITH DIFFERENTIAL/PLATELET - Abnormal; Notable for the following components:      Result Value   WBC 2.9 (*)    Lymphs Abs 0.6 (*)    All other components within normal limits  COMPREHENSIVE METABOLIC PANEL - Abnormal; Notable for the following components:   Potassium 3.0 (*)    CO2 19 (*)    Glucose, Bld 115 (*)    BUN 5 (*)    Calcium 8.4 (*)    AST 79 (*)    ALT 49 (*)    Anion gap 16 (*)    All other components within normal limits  GROUP A STREP BY PCR    EKG None  Radiology DG Neck Soft Tissue  Result Date: 09/18/2020 CLINICAL DATA:  Sore throat EXAM: NECK SOFT TISSUES - 1+ VIEW COMPARISON:  None. FINDINGS: Prevertebral thickening to 16 mm. Normal appearance of the epiglottis. No visible soft tissue gas or opaque foreign body. Mild cervical disc degeneration inferiorly. IMPRESSION: Prevertebral thickening, recommend neck CT with contrast. Electronically Signed   By: Monte Fantasia M.D.   On: 09/18/2020 06:19   DG Chest 2 View  Result Date: 09/18/2020 CLINICAL DATA:  Sore throat EXAM: CHEST - 2 VIEW COMPARISON:  08/06/2020 FINDINGS: Normal heart size and mediastinal contours. Stable interstitial markings. No acute infiltrate or edema. No effusion or pneumothorax. No acute osseous findings. IMPRESSION: No active cardiopulmonary disease. Electronically Signed   By: Monte Fantasia M.D.   On: 09/18/2020 06:18   CT Soft Tissue Neck W Contrast  Result Date:  09/18/2020 CLINICAL DATA:  Sore throat. EXAM: CT NECK WITH CONTRAST TECHNIQUE: Multidetector CT imaging of the neck was performed using the standard protocol following the bolus administration of intravenous contrast. CONTRAST:  43mL OMNIPAQUE IOHEXOL 300 MG/ML  SOLN COMPARISON:  None. FINDINGS: Pharynx and larynx: Retropharyngeal expansion with fluid density that is non organized or rim enhancing. No visible underlying pharyngitis or tonsillitis. Normal epiglottis.  Unfortunate motion and streak artifact at the level of the larynx. Salivary glands: No inflammation, mass, or stone. Thyroid: Normal. Lymph nodes: None enlarged or abnormal density. Vascular: Negative. Limited intracranial: Negative Visualized orbits: Essentially not covered Mastoids and visualized paranasal sinuses: Clear Skeleton: Negative.  No ossification of the longus coli. Upper chest: Negative IMPRESSION: Retropharyngeal effusion, presumably infectious but from uncertain source. No drainable collection. Electronically Signed   By: Monte Fantasia M.D.   On: 09/18/2020 07:05    Procedures Procedures   Medications Ordered in ED Medications  lidocaine (XYLOCAINE) 2 % viscous mouth solution 15 mL (15 mLs Mouth/Throat Given 09/18/20 0551)  methylPREDNISolone sodium succinate (SOLU-MEDROL) 125 mg/2 mL injection 125 mg (125 mg Intravenous Given 09/18/20 0551)  iohexol (OMNIPAQUE) 300 MG/ML solution 75 mL (75 mLs Intravenous Contrast Given 09/18/20 0633)  clindamycin (CLEOCIN) IVPB 600 mg (0 mg Intravenous Stopped 09/18/20 2297)    ED Course  I have reviewed the triage vital signs and the nursing notes.  Pertinent labs & imaging results that were available during my care of the patient were reviewed by me and considered in my medical decision making (see chart for details).    MDM Rules/Calculators/A&P                          His story is concerning for possible retropharyngeal abscess versus epiglottitis versus simple pharyngitis.  We  will going get some steroids and viscous lidocaine and get a couple x-rays and check a strep.  Strep negative.  His chest x-ray viewed by myself is normal.  His prevertebral tissue does seem edematous on his lateral neck we will order CT scan.  We will wait for kidney function prior to that.  Radiology agrees with increased prevertebral space.  Recommended CT scan which is been ordered.  Kidney function is unremarkable we will proceed with CT scan.  Patient seems to be slightly improved with medications provided  CT shows retropharyngeal effusion without obvious abscess.  Patient still having some difficulty swallowing.  He states that slightly improved but while talking to him he still has a hoarse voice and is still coughing up some of his secretions.  He did tolerate a little bit of liquids though.  He states he does not feel like it is much better than before.  No see any reason to consult otolaryngology at this time as there is no surgical procedure needs to be done.  Antibiotics have been ordered.  He is already had some steroids.  I will discuss with hospitalist for observation to ensure things are improving and to get IV antibiotics and possibly more steroids as needed until his odynophagia  And dysphagia improve.  Patient is now stating that he prefers to be discharged.  I discussed with him the reasoning for admission and the fact I wanted observed in case this got worse that he could have your nose and throat doctors evaluate him and make sure his breathing did not become infected or any other issues arise.  Patient was very hesitant to be admitted to the hospital as he states he is in hospital at time because of his prostate cancer.  He ultimately was able to tolerate p.o. and we decided that if he could have somebody with him 24 hours a day for the next couple days and return to emergency room immediately if anything changes or got worse that we can discharge him.  He was able to contact his  sister who will stay with him.  Prescriptions for liquid medications were given.  Patient was discharged in stable condition.  Final Clinical Impression(s) / ED Diagnoses Final diagnoses:  Pharyngitis, unspecified etiology    Rx / DC Orders ED Discharge Orders         Ordered    clindamycin (CLEOCIN) 75 MG/5ML solution  3 times daily        09/18/20 0747    dexamethasone (DECADRON) 0.5 MG/5ML solution  2 times daily        09/18/20 0747    lidocaine (XYLOCAINE) 2 % solution  Every 4 hours PRN,   Status:  Discontinued        09/18/20 0747           Talesha Ellithorpe, Corene Cornea, MD 09/19/20 1610

## 2020-09-18 NOTE — Discharge Instructions (Addendum)
As we talked about the safest bet is to stay in the hospital for observation. .  As we talked about your sister needs to stay with you at least until the swallowing improves.  At any point if your breathing becomes a problem or your difficulty swallowing gets any worse or you are drooling or other severe pain or symptoms you need to return to the nearest emergency room immediately and reevaluate the need for possible admission.  Please follow with your doctor in a few days to ensure improvement.  Once again do not hesitate to come back if things change or get worse.

## 2020-09-18 NOTE — ED Triage Notes (Signed)
Reports sore throat since this morning, states has coughing/vomiting fits. No drooling. Hoarse.

## 2020-09-21 ENCOUNTER — Ambulatory Visit
Admission: RE | Admit: 2020-09-21 | Discharge: 2020-09-21 | Disposition: A | Discharge status: Home / Self Care | Payer: 59 | Source: Ambulatory Visit | Attending: Radiation Oncology | Admitting: Radiation Oncology

## 2020-09-21 ENCOUNTER — Other Ambulatory Visit: Payer: Self-pay

## 2020-09-21 DIAGNOSIS — C61 Malignant neoplasm of prostate: Secondary | ICD-10-CM | POA: Diagnosis not present

## 2020-09-22 ENCOUNTER — Emergency Department (HOSPITAL_BASED_OUTPATIENT_CLINIC_OR_DEPARTMENT_OTHER): Payer: 59

## 2020-09-22 ENCOUNTER — Encounter (HOSPITAL_BASED_OUTPATIENT_CLINIC_OR_DEPARTMENT_OTHER): Payer: Self-pay | Admitting: Emergency Medicine

## 2020-09-22 ENCOUNTER — Ambulatory Visit
Admission: RE | Admit: 2020-09-22 | Discharge: 2020-09-22 | Disposition: A | Discharge status: Home / Self Care | Payer: 59 | Source: Ambulatory Visit | Attending: Radiation Oncology | Admitting: Radiation Oncology

## 2020-09-22 ENCOUNTER — Emergency Department (HOSPITAL_BASED_OUTPATIENT_CLINIC_OR_DEPARTMENT_OTHER)
Admission: EM | Admit: 2020-09-22 | Discharge: 2020-09-22 | Disposition: A | Discharge status: Home / Self Care | Payer: 59 | Attending: Emergency Medicine | Admitting: Emergency Medicine

## 2020-09-22 ENCOUNTER — Other Ambulatory Visit: Payer: Self-pay

## 2020-09-22 DIAGNOSIS — I10 Essential (primary) hypertension: Secondary | ICD-10-CM | POA: Insufficient documentation

## 2020-09-22 DIAGNOSIS — J029 Acute pharyngitis, unspecified: Secondary | ICD-10-CM | POA: Diagnosis present

## 2020-09-22 DIAGNOSIS — Z87891 Personal history of nicotine dependence: Secondary | ICD-10-CM | POA: Diagnosis not present

## 2020-09-22 DIAGNOSIS — E1169 Type 2 diabetes mellitus with other specified complication: Secondary | ICD-10-CM | POA: Insufficient documentation

## 2020-09-22 DIAGNOSIS — Z7984 Long term (current) use of oral hypoglycemic drugs: Secondary | ICD-10-CM | POA: Insufficient documentation

## 2020-09-22 DIAGNOSIS — Z79899 Other long term (current) drug therapy: Secondary | ICD-10-CM | POA: Diagnosis not present

## 2020-09-22 DIAGNOSIS — R1310 Dysphagia, unspecified: Secondary | ICD-10-CM

## 2020-09-22 DIAGNOSIS — Z8546 Personal history of malignant neoplasm of prostate: Secondary | ICD-10-CM | POA: Diagnosis not present

## 2020-09-22 DIAGNOSIS — Z20822 Contact with and (suspected) exposure to covid-19: Secondary | ICD-10-CM | POA: Diagnosis not present

## 2020-09-22 DIAGNOSIS — E785 Hyperlipidemia, unspecified: Secondary | ICD-10-CM | POA: Insufficient documentation

## 2020-09-22 DIAGNOSIS — C61 Malignant neoplasm of prostate: Secondary | ICD-10-CM | POA: Diagnosis not present

## 2020-09-22 DIAGNOSIS — E876 Hypokalemia: Secondary | ICD-10-CM

## 2020-09-22 LAB — CBC WITH DIFFERENTIAL/PLATELET
Abs Immature Granulocytes: 0.02 10*3/uL (ref 0.00–0.07)
Basophils Absolute: 0 10*3/uL (ref 0.0–0.1)
Basophils Relative: 0 %
Eosinophils Absolute: 0 10*3/uL (ref 0.0–0.5)
Eosinophils Relative: 0 %
HCT: 37.7 % — ABNORMAL LOW (ref 39.0–52.0)
Hemoglobin: 13.4 g/dL (ref 13.0–17.0)
Immature Granulocytes: 0 %
Lymphocytes Relative: 11 %
Lymphs Abs: 0.7 10*3/uL (ref 0.7–4.0)
MCH: 32.1 pg (ref 26.0–34.0)
MCHC: 35.5 g/dL (ref 30.0–36.0)
MCV: 90.4 fL (ref 80.0–100.0)
Monocytes Absolute: 0.4 10*3/uL (ref 0.1–1.0)
Monocytes Relative: 7 %
Neutro Abs: 5.1 10*3/uL (ref 1.7–7.7)
Neutrophils Relative %: 82 %
Platelets: 165 10*3/uL (ref 150–400)
RBC: 4.17 MIL/uL — ABNORMAL LOW (ref 4.22–5.81)
RDW: 13.7 % (ref 11.5–15.5)
WBC: 6.3 10*3/uL (ref 4.0–10.5)
nRBC: 0 % (ref 0.0–0.2)

## 2020-09-22 LAB — RESP PANEL BY RT-PCR (FLU A&B, COVID) ARPGX2
Influenza A by PCR: NEGATIVE
Influenza B by PCR: NEGATIVE
SARS Coronavirus 2 by RT PCR: NEGATIVE

## 2020-09-22 LAB — BASIC METABOLIC PANEL
Anion gap: 14 (ref 5–15)
BUN: 9 mg/dL (ref 6–20)
CO2: 25 mmol/L (ref 22–32)
Calcium: 8.9 mg/dL (ref 8.9–10.3)
Chloride: 95 mmol/L — ABNORMAL LOW (ref 98–111)
Creatinine, Ser: 0.92 mg/dL (ref 0.61–1.24)
GFR, Estimated: >60 mL/min (ref >60)
Glucose, Bld: 354 mg/dL — ABNORMAL HIGH (ref 70–99)
Potassium: 3 mmol/L — ABNORMAL LOW (ref 3.5–5.1)
Sodium: 134 mmol/L — ABNORMAL LOW (ref 135–145)

## 2020-09-22 LAB — MONONUCLEOSIS SCREEN: Mono Screen: NEGATIVE

## 2020-09-22 LAB — GROUP A STREP BY PCR: Group A Strep by PCR: NOT DETECTED

## 2020-09-22 LAB — C-REACTIVE PROTEIN: CRP: 2 mg/dL — ABNORMAL HIGH (ref <1.0)

## 2020-09-22 LAB — SEDIMENTATION RATE: Sed Rate: 42 mm/hr — ABNORMAL HIGH (ref 0–16)

## 2020-09-22 MED ORDER — POTASSIUM CHLORIDE CRYS ER 20 MEQ PO TBCR: 40.0000 meq | EXTENDED_RELEASE_TABLET | Freq: Every day | ORAL | 0 refills | Status: DC

## 2020-09-22 MED ORDER — SODIUM CHLORIDE 0.9 % IV BOLUS
1000.0000 mL | Freq: Once | INTRAVENOUS | Status: AC
  Administered 2020-09-22: 1000 mL via INTRAVENOUS

## 2020-09-22 MED ORDER — IOHEXOL 300 MG/ML  SOLN
100.0000 mL | Freq: Once | INTRAMUSCULAR | Status: AC | PRN
  Administered 2020-09-22: 75 mL via INTRAVENOUS

## 2020-09-22 NOTE — Discharge Instructions (Signed)
Your CT scan looks much better today. Please continue your steroids and antibiotics. Please sip fluids throughout the day today to stay hydrated. Eat small meals as tolerated. Please follow up with your PCP in the next 3 days for repeat potassium labs. Call today to schedule that appointment.

## 2020-09-22 NOTE — ED Notes (Signed)
Returned from CT.

## 2020-09-22 NOTE — ED Triage Notes (Signed)
Patient here with complaints of throat problems; seen here 4/22 for same; patient currently receiving radiation. Patient requesting admission.

## 2020-09-22 NOTE — ED Notes (Signed)
Taken to CT.

## 2020-09-22 NOTE — ED Provider Notes (Signed)
[facility] EMERGENCY DEPARTMENT Provider Note   CSN: [id number] Arrival date & time: 09/22/20  0865     History Chief Complaint  Patient presents with  . Sore Throat    Jeremy Clark is a 55 y.o. male.  Patient presents to the emergency department with concerns over insomnia and inability to eat.  Patient was seen in the ER 4 days ago with sore throat and was found to have fluid collection in the retropharyngeal area.  He declined admission at that time.  He has been taking Decadron and antibiotics.  He called his doctor and was told that he needed to come back to the ER because his doctor did not have CAT scans and other tests that would be needed.  Patient says his throat feels better.  He cannot tell me why he is not eating.  It is not secondary to pain.  He also is not sure why he is not sleeping, it is not secondary to pain.        Past Medical History:  Diagnosis Date  . Abdominal hernia   . Diabetes mellitus   . High cholesterol   . Hypertension   . Prostate cancer [facility])     Patient Active Problem List   Diagnosis Date Noted  . Elevated PSA 01/01/2020  . Prostate mass 01/01/2020  . Prostate cancer ([place]) 01/01/2020  . Vitamin D deficiency 11/08/2019  . Controlled type 2 diabetes mellitus with complication, with long-term current use of insulin ([place]) 10/15/2018  . Essential hypertension 10/15/2018  . Hyperlipidemia 10/15/2018  . Abdominal wall hernia 03/19/2015  . Morbid obesity with BMI of 50.0-59.9, adult ([place]) 03/27/2014    Past Surgical History:  Procedure Laterality Date  . PROSTATE BIOPSY    . SHOULDER SURGERY     right       Family History  Problem Relation Age of Onset  . Hypertension Mother   . Prostate cancer Father   . Aneurysm Daughter   . Colon cancer Neg Hx   . Pancreatic cancer Neg Hx   . Breast cancer Neg Hx     Social History   Tobacco Use  . Smoking status: Former Smoker    Packs/day: 0.50    Years: 20.00     Pack years: 10.00    Quit date: 05/31/2003    Years since quitting: 17.3  . Smokeless tobacco: Never Used  Vaping Use  . Vaping Use: Never used  Substance Use Topics  . Alcohol use: Yes    Comment: occ  . Drug use: No    Home Medications Prior to Admission medications   Medication Sig Start Date End Date Taking? Authorizing Provider  busPIRone (BUSPAR) 10 MG tablet Take 10 mg by mouth 2 (two) times daily.    [provider]  Canagliflozin-metFORMIN HCl (INVOKAMET) [phone] MG TABS Take by mouth. 04/30/20   [provider]  clindamycin (CLEOCIN) 75 MG/5ML solution Take 20 mLs (300 mg total) by mouth 3 (three) times daily for 10 days. 09/18/20 09/28/20  Mesner, Corene Cornea, MD  dexamethasone (DECADRON) 0.5 MG/5ML solution Take 40 mLs (4 mg total) by mouth 2 (two) times daily for 7 days. 09/18/20 09/25/20  Mesner, Corene Cornea, MD  lisinopril (PRINIVIL,ZESTRIL) 30 MG tablet Take 30 mg by mouth daily.    [provider]  simvastatin (ZOCOR) 20 MG tablet Take 20 mg by mouth daily.    [provider]  tamsulosin (FLOMAX) 0.4 MG CAPS capsule Take 0.4 mg by mouth  daily. 03/29/20   [provider]    Allergies    Doxycycline  Review of Systems   Review of Systems  Constitutional: Positive for appetite change.  Psychiatric/Behavioral: Positive for sleep disturbance.  All other systems reviewed and are negative.   Physical Exam Updated Vital Signs BP (!) 165/92 (BP Location: Right Arm)   Pulse 95   Temp 98 F (36.7 C) (Oral)   Resp 18   Ht 5\' 4"  (1.626 m)   Wt 129.7 kg   SpO2 99%   BMI 49.08 kg/m   Physical Exam Vitals and nursing note reviewed.  Constitutional:      General: He is not in acute distress.    Appearance: Normal appearance. He is well-developed.  HENT:     Head: Normocephalic and atraumatic.     Right Ear: Hearing normal.     Left Ear: Hearing normal.     Nose: Nose normal.  Eyes:     Conjunctiva/sclera: Conjunctivae normal.      Pupils: Pupils are equal, round, and reactive to light.  Cardiovascular:     Rate and Rhythm: Regular rhythm.     Heart sounds: S1 normal and S2 normal. No murmur heard. No friction rub. No gallop.   Pulmonary:     Effort: Pulmonary effort is normal. No respiratory distress.     Breath sounds: Normal breath sounds.  Chest:     Chest wall: No tenderness.  Abdominal:     General: Bowel sounds are normal.     Palpations: Abdomen is soft.     Tenderness: There is no abdominal tenderness. There is no guarding or rebound. Negative signs include Murphy's sign and McBurney's sign.     Hernia: No hernia is present.  Musculoskeletal:        General: Normal range of motion.     Cervical back: Normal range of motion and neck supple.  Skin:    General: Skin is warm and dry.     Findings: No rash.  Neurological:     Mental Status: He is alert and oriented to person, place, and time.     GCS: GCS eye subscore is 4. GCS verbal subscore is 5. GCS motor subscore is 6.     Cranial Nerves: No cranial nerve deficit.     Sensory: No sensory deficit.     Coordination: Coordination normal.  Psychiatric:        Speech: Speech normal.        Behavior: Behavior normal.        Thought Content: Thought content normal.     ED Results / Procedures / Treatments   Labs (all labs ordered are listed, but only abnormal results are displayed) Labs Reviewed  GROUP A STREP BY PCR  CBC WITH DIFFERENTIAL/PLATELET  BASIC METABOLIC PANEL  MONONUCLEOSIS SCREEN  SEDIMENTATION RATE  C-REACTIVE PROTEIN    EKG None  Radiology No results found.  Procedures Procedures   Medications Ordered in ED Medications  sodium chloride 0.9 % bolus 1,000 mL (has no administration in time range)    ED Course  I have reviewed the triage vital signs and the nursing notes.  Pertinent labs & imaging results that were available during my care of the patient were reviewed by me and considered in my medical decision making  (see chart for details).    MDM Rules/Calculators/A&P  Patient with ill-defined retropharyngeal fluid collection seen on CT scan 4 days ago.  Presents to the emergency department now saying that he is not eating and cannot sleep.  Insomnia might be secondary to the Decadron.  He appears well.  He appears to be handling his secretions well.  It seems that he might be having some difficulty swallowing causing his decreased oral intake.  We will need to determine if the Decadron and antibiotics has improved the fluid collection.  Repeat CT scan of soft tissue neck will be performed to see if he has worsening retropharyngeal fluid collection or organized abscess or if this has resolved.  Will need to sign out to oncoming ER physician to follow-up on testing.  Final Clinical Impression(s) / ED Diagnoses Final diagnoses:  Dysphagia, unspecified type    Rx / DC Orders ED Discharge Orders    None       Orpah Greek, MD 09/22/20 [phone]

## 2020-09-22 NOTE — ED Notes (Signed)
Pt at sink coughing and spitting up phlegm, states he is not nauseous, but his mouth is dry. MD at bedside, scan has improved.  Po fluids given to pt. Pt has his liquid antibiotics and steroids at bedside, MD states he can take those at this time

## 2020-09-22 NOTE — ED Provider Notes (Signed)
Blood pressure (!) 165/92, pulse 95, temperature 98 F (36.7 C), temperature source Oral, resp. rate 18, height 5\' 4"  (1.626 m), weight 129.7 kg, SpO2 99 %.  Assuming care from Dr. Betsey Holiday.  In short, Jeremy Clark is a 55 y.o. male with a chief complaint of Sore Throat .  Refer to the original H&P for additional details.  The current plan of care is to follow up on labs and repeat CT soft tissue neck. Prior study with some retropharyngeal edema but no drainable fluid collection. Had been on Clindamycin and Decadron for the last 4 days.   Labs resulting. No AKI. Hyperglycemia w/o anion gap or low CO2 to suspect DKA. No leukocytosis. Hypokalemia noted similar to prior labs. CT soft tissue neck ordered.   10:00 AM  CT imaging of the neck reviewed.  Radiology comments that the retropharyngeal swelling is significantly improved.  The patient is feeling improved after some IV fluids.  He is having some difficulty sleeping which I suspect will continue while he is on steroids but have advised that he continue with the current treatment plan as it seems to be radiographically improving his symptoms significantly.  He did drink fluids and take his medications here in the emergency department without difficulty or vomiting.  In review of his home medications he is on lisinopril but this does not seem consistent with angioedema.  He has been taking his lisinopril since his first CT scan at home and symptoms continue to improve.   We discussed over-the-counter medications to help with sleep including melatonin.  Plan to complete current treatment course and follow closely with the PCP.  Discussed ED return precautions in detail.  Will call in prescription for potassium tablets for the next 5 days.     Margette Fast, MD 09/22/20 1002

## 2020-09-23 ENCOUNTER — Ambulatory Visit
Admission: RE | Admit: 2020-09-23 | Discharge: 2020-09-23 | Disposition: A | Discharge status: Home / Self Care | Payer: 59 | Source: Ambulatory Visit | Attending: Radiation Oncology | Admitting: Radiation Oncology

## 2020-09-23 DIAGNOSIS — C61 Malignant neoplasm of prostate: Secondary | ICD-10-CM | POA: Diagnosis not present

## 2020-09-24 ENCOUNTER — Ambulatory Visit
Admission: RE | Admit: 2020-09-24 | Discharge: 2020-09-24 | Disposition: A | Discharge status: Home / Self Care | Payer: 59 | Source: Ambulatory Visit | Attending: Radiation Oncology | Admitting: Radiation Oncology

## 2020-09-24 ENCOUNTER — Other Ambulatory Visit: Payer: Self-pay

## 2020-09-24 DIAGNOSIS — C61 Malignant neoplasm of prostate: Secondary | ICD-10-CM | POA: Diagnosis not present

## 2020-09-25 ENCOUNTER — Ambulatory Visit
Admission: RE | Admit: 2020-09-25 | Discharge: 2020-09-25 | Disposition: A | Discharge status: Home / Self Care | Payer: 59 | Source: Ambulatory Visit | Attending: Radiation Oncology | Admitting: Radiation Oncology

## 2020-09-25 DIAGNOSIS — C61 Malignant neoplasm of prostate: Secondary | ICD-10-CM | POA: Diagnosis not present

## 2020-09-28 ENCOUNTER — Ambulatory Visit
Admission: RE | Admit: 2020-09-28 | Discharge: 2020-09-28 | Disposition: A | Discharge status: Home / Self Care | Payer: 59 | Source: Ambulatory Visit | Attending: Radiation Oncology | Admitting: Radiation Oncology

## 2020-09-28 DIAGNOSIS — C61 Malignant neoplasm of prostate: Secondary | ICD-10-CM | POA: Diagnosis present

## 2020-09-28 DIAGNOSIS — Z510 Encounter for antineoplastic radiation therapy: Secondary | ICD-10-CM | POA: Insufficient documentation

## 2020-09-29 ENCOUNTER — Other Ambulatory Visit: Payer: Self-pay

## 2020-09-29 ENCOUNTER — Ambulatory Visit
Admission: RE | Admit: 2020-09-29 | Discharge: 2020-09-29 | Disposition: A | Discharge status: Home / Self Care | Payer: 59 | Source: Ambulatory Visit | Attending: Radiation Oncology | Admitting: Radiation Oncology

## 2020-09-29 DIAGNOSIS — Z510 Encounter for antineoplastic radiation therapy: Secondary | ICD-10-CM | POA: Diagnosis not present

## 2020-09-30 ENCOUNTER — Ambulatory Visit
Admission: RE | Admit: 2020-09-30 | Discharge: 2020-09-30 | Disposition: A | Discharge status: Home / Self Care | Payer: 59 | Source: Ambulatory Visit | Attending: Radiation Oncology | Admitting: Radiation Oncology

## 2020-09-30 DIAGNOSIS — Z510 Encounter for antineoplastic radiation therapy: Secondary | ICD-10-CM | POA: Diagnosis not present

## 2020-10-01 ENCOUNTER — Other Ambulatory Visit: Payer: Self-pay

## 2020-10-01 ENCOUNTER — Ambulatory Visit
Admission: RE | Admit: 2020-10-01 | Discharge: 2020-10-01 | Disposition: A | Discharge status: Home / Self Care | Payer: 59 | Source: Ambulatory Visit | Attending: Radiation Oncology | Admitting: Radiation Oncology

## 2020-10-01 DIAGNOSIS — Z510 Encounter for antineoplastic radiation therapy: Secondary | ICD-10-CM | POA: Diagnosis not present

## 2020-10-02 ENCOUNTER — Ambulatory Visit
Admission: RE | Admit: 2020-10-02 | Discharge: 2020-10-02 | Disposition: A | Discharge status: Home / Self Care | Payer: 59 | Source: Ambulatory Visit | Attending: Radiation Oncology | Admitting: Radiation Oncology

## 2020-10-02 DIAGNOSIS — Z510 Encounter for antineoplastic radiation therapy: Secondary | ICD-10-CM | POA: Diagnosis not present

## 2020-10-05 ENCOUNTER — Other Ambulatory Visit: Payer: Self-pay

## 2020-10-05 ENCOUNTER — Ambulatory Visit
Admission: RE | Admit: 2020-10-05 | Discharge: 2020-10-05 | Disposition: A | Discharge status: Home / Self Care | Payer: 59 | Source: Ambulatory Visit | Attending: Radiation Oncology | Admitting: Radiation Oncology

## 2020-10-05 DIAGNOSIS — Z510 Encounter for antineoplastic radiation therapy: Secondary | ICD-10-CM | POA: Diagnosis not present

## 2020-10-06 ENCOUNTER — Ambulatory Visit
Admission: RE | Admit: 2020-10-06 | Discharge: 2020-10-06 | Disposition: A | Discharge status: Home / Self Care | Payer: 59 | Source: Ambulatory Visit | Attending: Radiation Oncology | Admitting: Radiation Oncology

## 2020-10-06 DIAGNOSIS — Z510 Encounter for antineoplastic radiation therapy: Secondary | ICD-10-CM | POA: Diagnosis not present

## 2020-10-07 ENCOUNTER — Ambulatory Visit
Admission: RE | Admit: 2020-10-07 | Discharge: 2020-10-07 | Disposition: A | Discharge status: Home / Self Care | Payer: 59 | Source: Ambulatory Visit | Attending: Radiation Oncology | Admitting: Radiation Oncology

## 2020-10-07 DIAGNOSIS — Z510 Encounter for antineoplastic radiation therapy: Secondary | ICD-10-CM | POA: Diagnosis not present

## 2020-10-08 ENCOUNTER — Ambulatory Visit
Admission: RE | Admit: 2020-10-08 | Discharge: 2020-10-08 | Disposition: A | Discharge status: Home / Self Care | Payer: 59 | Source: Ambulatory Visit | Attending: Radiation Oncology | Admitting: Radiation Oncology

## 2020-10-08 DIAGNOSIS — Z510 Encounter for antineoplastic radiation therapy: Secondary | ICD-10-CM | POA: Diagnosis not present

## 2020-10-09 ENCOUNTER — Ambulatory Visit
Admission: RE | Admit: 2020-10-09 | Discharge: 2020-10-09 | Disposition: A | Discharge status: Home / Self Care | Payer: 59 | Source: Ambulatory Visit | Attending: Radiation Oncology | Admitting: Radiation Oncology

## 2020-10-09 ENCOUNTER — Other Ambulatory Visit: Payer: Self-pay

## 2020-10-09 DIAGNOSIS — Z510 Encounter for antineoplastic radiation therapy: Secondary | ICD-10-CM | POA: Diagnosis not present

## 2020-10-12 ENCOUNTER — Ambulatory Visit
Admission: RE | Admit: 2020-10-12 | Discharge: 2020-10-12 | Disposition: A | Discharge status: Home / Self Care | Payer: 59 | Source: Ambulatory Visit | Attending: Radiation Oncology | Admitting: Radiation Oncology

## 2020-10-12 ENCOUNTER — Ambulatory Visit: Payer: 59

## 2020-10-12 DIAGNOSIS — Z510 Encounter for antineoplastic radiation therapy: Secondary | ICD-10-CM | POA: Diagnosis not present

## 2020-10-13 ENCOUNTER — Ambulatory Visit: Payer: 59

## 2020-10-13 ENCOUNTER — Other Ambulatory Visit: Payer: Self-pay

## 2020-10-13 ENCOUNTER — Ambulatory Visit
Admission: RE | Admit: 2020-10-13 | Discharge: 2020-10-13 | Disposition: A | Discharge status: Home / Self Care | Payer: 59 | Source: Ambulatory Visit | Attending: Radiation Oncology | Admitting: Radiation Oncology

## 2020-10-13 DIAGNOSIS — Z510 Encounter for antineoplastic radiation therapy: Secondary | ICD-10-CM | POA: Diagnosis not present

## 2020-10-14 ENCOUNTER — Encounter: Payer: Self-pay | Admitting: Urology

## 2020-10-14 ENCOUNTER — Ambulatory Visit
Admission: RE | Admit: 2020-10-14 | Discharge: 2020-10-14 | Disposition: A | Discharge status: Home / Self Care | Payer: 59 | Source: Ambulatory Visit | Attending: Radiation Oncology | Admitting: Radiation Oncology

## 2020-10-14 DIAGNOSIS — Z510 Encounter for antineoplastic radiation therapy: Secondary | ICD-10-CM | POA: Diagnosis not present

## 2020-10-15 ENCOUNTER — Ambulatory Visit: Payer: 59 | Admitting: Podiatry

## 2020-10-19 ENCOUNTER — Other Ambulatory Visit: Payer: Self-pay

## 2020-10-19 ENCOUNTER — Encounter: Payer: Self-pay | Admitting: Pulmonary Disease

## 2020-10-19 ENCOUNTER — Ambulatory Visit (INDEPENDENT_AMBULATORY_CARE_PROVIDER_SITE_OTHER): Payer: 59 | Admitting: Pulmonary Disease

## 2020-10-19 VITALS — BP 150/86 | HR 111 | Temp 98.0°F | Ht 64.0 in | Wt 280.0 lb

## 2020-10-19 DIAGNOSIS — R0600 Dyspnea, unspecified: Secondary | ICD-10-CM

## 2020-10-19 DIAGNOSIS — R0609 Other forms of dyspnea: Secondary | ICD-10-CM

## 2020-10-19 MED ORDER — BREO ELLIPTA 100-25 MCG/INH IN AEPB: 1.0000 | INHALATION_SPRAY | Freq: Every day | RESPIRATORY_TRACT | 0 refills | Status: DC

## 2020-10-19 NOTE — Progress Notes (Signed)
@Patient  ID: Jeremy Clark, male    DOB: May 04, 1966, 55 y.o.   MRN: [id number]  Chief Complaint  Patient presents with  . Consult    Sob at rest and with activity, started months ago    Referring provider: [name], MD  HPI:   55 year old whom we are seeing in consultation for evaluation of dyspnea on exertion.  PCP note reviewed.  ED note x2 reviewed.  Patient was in good state of health until October 2021.  He noted he used to weigh 440 pounds or so.  Was on a good regimen losing weight.  Had lost 50+ pounds.  Was walking 3 miles a day.  Exercising otherwise.  No breathing issues.  Was diagnosed with prostate cancer around that time and had post biopsy infection complication.  Since then he is far less active.  Notes onset of dyspnea at that point in time.  Not walking regularly, not exercising.  Shortness of breath worse with exertion, inclines or stairs.  Randomly does have shortness of breath at rest.  No cough.  No time of day where things are better or worse.  Denies any seasonal changes contribute to better or worse symptoms.  No environmental changes may account for symptoms.  He notes chronic seasonal allergies, rhinorrhea, watery eyes.  Present during the spring.  Not sure breathing worsened in this setting.  Has tried albuterol as needed without improvement.  Oddly in late April 2022 was found to have retropharyngeal swelling.  Was put on Decadron.  Symptoms for 5 days.  Unsure if this helped his breathing but did note his sugars were really high and felt awful.  Part of the work-up for his symptoms included referral to cardiology.  Cannot see all these notes but so far work-up has been unremarkable per his report.  He had a chest x-ray 07/2020 on my interpretation reveals clear lungs.  He had a CTA PE protocol 07/2020 that shows no PE and clear lungs on my interpretation.  Subsequent chest x-ray 08/2020 clear on my interpretation as well.  He is finished radiation treatments  for his prostate cancer.  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  MMRC: No flowsheet data found.  Epworth:  No flowsheet data found.  Tests:   FENO:  No results found for: NITRICOXIDE  PFT: No flowsheet data found.  WALK:  No flowsheet data found.  Imaging: CT Soft Tissue Neck W Contrast  Result Date: 09/22/2020 CLINICAL DATA:  Sore throat with retropharyngeal effusion on CT, follow-up post antibiotics EXAM: CT NECK WITH CONTRAST TECHNIQUE: Multidetector CT imaging of the neck was performed using the standard protocol following the bolus administration of intravenous contrast. CONTRAST:  5mL OMNIPAQUE IOHEXOL 300 MG/ML  SOLN COMPARISON:  09/18/2020 FINDINGS: Pharynx and larynx: Substantially improved retropharyngeal effusion. Decreased prominence of oropharyngeal and supraglottic larynx soft tissues may reflect improved swelling. Salivary glands: Parotid and submandibular glands are unremarkable. Thyroid: Normal. Lymph nodes: No enlarged or abnormal density nodes. Vascular: Major neck vessels are patent. Partially retropharyngeal course of the ICAs. Limited intracranial: No abnormal enhancement. Visualized orbits: Unremarkable. Mastoids and visualized paranasal  sinuses: No significant opacification. Skeleton: Minor cervical spine degenerative changes. Upper chest: Included lungs are clear. Other: None. IMPRESSION: Substantial improvement in retropharyngeal effusion and probable decreased swelling of pharyngeal soft tissues. Electronically  Signed   By: Macy Mis M.D.   On: 09/22/2020 08:56    Lab Results: Personally reviewed, no anemia, no significant elevation of eosinophils CBC    Component Value Date/Time   WBC 6.3 09/22/2020 0651   RBC 4.17 (L) 09/22/2020 0651   HGB 13.4 09/22/2020 0651   HCT 37.7 (L) 09/22/2020 0651   PLT 165 09/22/2020 0651   MCV 90.4 09/22/2020 0651   MCH 32.1 09/22/2020 0651   MCHC 35.5 09/22/2020 0651   RDW 13.7 09/22/2020 0651   LYMPHSABS 0.7 09/22/2020 0651   MONOABS 0.4 09/22/2020 0651   EOSABS 0.0 09/22/2020 0651   BASOSABS 0.0 09/22/2020 0651    BMET    Component Value Date/Time   NA 134 (L) 09/22/2020 0651   K 3.0 (L) 09/22/2020 0651   CL 95 (L) 09/22/2020 0651   CO2 25 09/22/2020 0651   GLUCOSE 354 (H) 09/22/2020 0651   BUN 9 09/22/2020 0651   CREATININE 0.92 09/22/2020 0651   CALCIUM 8.9 09/22/2020 0651   GFRNONAA >60 09/22/2020 0651   GFRAA >60 06/28/2015 0335    BNP    Component Value Date/Time   BNP 14.0 08/06/2020 1040    ProBNP    Component Value Date/Time   PROBNP 23.7 08/18/2011 0345    Specialty Problems   None     Allergies  Allergen Reactions  . Doxycycline Rash    Immunization History  Administered Date(s) Administered  . Influenza Inj Mdck Quad With Preservative 03/22/2018  . Influenza, Quadrivalent, Recombinant, Inj, Pf 05/18/2017  . Influenza, Seasonal, Injecte, Preservative Fre 03/19/2015, 01/29/2016, 03/30/2018  . Influenza,trivalent, recombinat, inj, PF 04/16/2011, 02/28/2012, 03/14/2013  . PFIZER(Purple Top)SARS-COV-2 Vaccination 08/03/2019, 09/02/2019, 03/12/2020  . Pneumococcal Polysaccharide-23 02/28/2012  . Tdap 02/10/2011    Past Medical History:  Diagnosis Date  . Abdominal hernia   . Diabetes mellitus   . High cholesterol   . Hypertension   . Prostate cancer ([place])     Tobacco History: Social History   Tobacco Use  Smoking Status Former Smoker  . Packs/day: 0.50  . Years: 20.00  . Pack years: 10.00  .  Quit date: 05/31/2003  . Years since quitting: 17.4  Smokeless Tobacco Never Used   Counseling given: Not Answered   Continue to not smoke  Outpatient Encounter Medications as of 10/19/2020  Medication Sig  . busPIRone (BUSPAR) 10 MG tablet Take 10 mg by mouth 2 (two) times daily.  . Canagliflozin-metFORMIN HCl (INVOKAMET) [phone] MG TABS Take by mouth.  . fluticasone furoate-vilanterol (BREO ELLIPTA) 100-25 MCG/INH AEPB Inhale 1 puff into the lungs daily.  Marland Kitchen lisinopril (PRINIVIL,ZESTRIL) 30 MG tablet Take 30 mg by mouth daily.  . simvastatin (ZOCOR) 20 MG tablet Take 20 mg by mouth daily.  . tamsulosin (FLOMAX) 0.4 MG CAPS capsule Take 0.4 mg by mouth daily. (Patient not taking: Reported on 10/19/2020)  . [DISCONTINUED] potassium chloride SA (KLOR-CON) 20 MEQ tablet Take 2 tablets (40 mEq total) by mouth daily for 5 days.   No facility-administered encounter medications on file as of 10/19/2020.     Review of Systems  Review of Systems  No chest pain with exertion.  No orthopnea or PND.  No lower extremity swelling.  Comprehensive review of systems otherwise negative. Physical Exam  BP (!) 150/86 (  BP Location: Right Arm, Cuff Size: Normal)   Pulse (!) 111   Temp 98 F (36.7 C)   Ht 5\' 4"  (1.626 m)   Wt 280 lb (127 kg)   SpO2 100%   BMI 48.06 kg/m   Wt Readings from Last 5 Encounters:  10/19/20 280 lb (127 kg)  09/22/20 285 lb 15 oz (129.7 kg)  09/18/20 286 lb (129.7 kg)  08/06/20 (!) 304 lb (137.9 kg)  05/06/20 291 lb (132 kg)    BMI Readings from Last 5 Encounters:  10/19/20 48.06 kg/m  09/22/20 49.08 kg/m  09/18/20 49.09 kg/m  08/06/20 52.18 kg/m  05/06/20 49.95 kg/m     Physical Exam General: Well-appearing, no acute distress Eyes: EOMI, watery Neck: Supple, no JVP appreciated Cardiovascular: Tachycardic, no murmurs Pulmonary: Clear to auscultation bilaterally, no wheeze or crackle, normal work of breathing Abdomen: Nondistended, bowel sounds  present, large central hernia noted MSK: No synovitis, no joint effusion Neuro: Normal gait, no weakness Psych: Normal mood, full affect   Assessment & Plan:   Dyspnea on exertion: present since illness 02/2020. Highest suspicion for deconditioning as not nearly as active (was walking 3 miles a day, etc.). Weight may be contributing but weight is significantly down. Recent CXR and CT chest clear reducing concern for pulmonary parenchymal disease. No real improvement with albuterol. H/o atopic symptoms so asthma possible. Trial Breo. PFTs for further evaluation.    Return in about 4 weeks (around 11/16/2020).   Lanier Clam, MD 10/19/2020

## 2020-10-19 NOTE — Patient Instructions (Signed)
Your CT and chest xray look good which is reassuring.  Use Breo 1 puff daily - rinse mouth after every use  If this helps and not having side effects, call our office and let us know and I will send a prescription to the pharmacy.  I ordered breathing tests, schedule next available.   PFTs next avaialble.  Follow up in 4-6 weeks with Dr. Silas Flood

## 2020-11-02 ENCOUNTER — Ambulatory Visit: Payer: 59 | Admitting: Podiatry

## 2020-11-05 ENCOUNTER — Encounter: Payer: Self-pay | Admitting: Podiatry

## 2020-11-05 ENCOUNTER — Other Ambulatory Visit: Payer: Self-pay

## 2020-11-05 ENCOUNTER — Ambulatory Visit (INDEPENDENT_AMBULATORY_CARE_PROVIDER_SITE_OTHER): Payer: 59 | Admitting: Podiatry

## 2020-11-05 DIAGNOSIS — B351 Tinea unguium: Secondary | ICD-10-CM

## 2020-11-05 DIAGNOSIS — E118 Type 2 diabetes mellitus with unspecified complications: Secondary | ICD-10-CM

## 2020-11-05 DIAGNOSIS — M79674 Pain in right toe(s): Secondary | ICD-10-CM

## 2020-11-05 DIAGNOSIS — M79675 Pain in left toe(s): Secondary | ICD-10-CM

## 2020-11-05 DIAGNOSIS — Z794 Long term (current) use of insulin: Secondary | ICD-10-CM

## 2020-11-08 NOTE — Progress Notes (Signed)
  Subjective:  Patient ID: Jeremy Clark, male    DOB: 1966/04/03,  MRN: [id number]  Chief Complaint  Patient presents with   Nail Problem    Thick painful toenails, 3 month follow up    55 y.o. male returns with the above complaint. History confirmed with patient.  Doing well, has completed radiation treatment for prostate cancer. Going for eval for hernia repair today by Surgery.  Objective:  Physical Exam: warm, good capillary refill, no trophic changes or ulcerative lesions, normal DP and PT pulses, normal sensory exam, onychomycosis x10 with subungual rate, yellow and brown elongated toenails with thinning of nail plates  Assessment:   1. Pain due to onychomycosis of toenails of both feet   2. Controlled type 2 diabetes mellitus with complication, with long-term current use of insulin ([place])       Plan:  Patient was evaluated and treated and all questions answered.  Patient educated on diabetes. Discussed proper diabetic foot care and discussed risks and complications of disease. Educated patient in depth on reasons to return to the office immediately should he/she discover anything concerning or new on the feet. All questions answered. Discussed proper shoes as well.    Discussed the etiology and treatment options for the condition in detail with the patient. Educated patient on the topical and oral treatment options for mycotic nails. Recommended debridement of the nails today. Sharp and mechanical debridement performed of all painful and mycotic nails today. Nails debrided in length and thickness using a nail nipper to level of comfort. Discussed treatment options including appropriate shoe gear. Follow up as needed for painful nails.     Return in about 3 months (around 02/05/2021) for at risk diabetic foot care.

## 2020-11-19 ENCOUNTER — Other Ambulatory Visit: Payer: Self-pay

## 2020-11-19 ENCOUNTER — Encounter: Payer: Self-pay | Admitting: Pulmonary Disease

## 2020-11-19 ENCOUNTER — Ambulatory Visit (INDEPENDENT_AMBULATORY_CARE_PROVIDER_SITE_OTHER): Payer: 59 | Admitting: Pulmonary Disease

## 2020-11-19 VITALS — BP 140/82 | HR 69 | Ht 64.0 in | Wt 293.0 lb

## 2020-11-19 DIAGNOSIS — R0600 Dyspnea, unspecified: Secondary | ICD-10-CM

## 2020-11-19 DIAGNOSIS — R0609 Other forms of dyspnea: Secondary | ICD-10-CM

## 2020-11-19 LAB — PULMONARY FUNCTION TEST
DL/VA % pred: 117 %
DL/VA: 5.24 ml/min/mmHg/L
DLCO unc % pred: 88 %
DLCO unc: 20.92 ml/min/mmHg
FEF 25-75 Post: 1.36 L/sec
FEF 25-75 Pre: 1.52 L/sec
FEF2575-%Change-Post: -10 %
FEF2575-%Pred-Post: 51 %
FEF2575-%Pred-Pre: 57 %
FEV1-%Change-Post: -4 %
FEV1-%Pred-Post: 74 %
FEV1-%Pred-Pre: 77 %
FEV1-Post: 1.92 L
FEV1-Pre: 2.01 L
FEV1FVC-%Change-Post: -3 %
FEV1FVC-%Pred-Pre: 90 %
FEV6-%Change-Post: 0 %
FEV6-%Pred-Post: 87 %
FEV6-%Pred-Pre: 88 %
FEV6-Post: 2.76 L
FEV6-Pre: 2.78 L
FEV6FVC-%Pred-Post: 103 %
FEV6FVC-%Pred-Pre: 103 %
FVC-%Change-Post: 0 %
FVC-%Pred-Post: 85 %
FVC-%Pred-Pre: 85 %
FVC-Post: 2.78 L
FVC-Pre: 2.8 L
Post FEV1/FVC ratio: 69 %
Post FEV6/FVC ratio: 100 %
Pre FEV1/FVC ratio: 72 %
Pre FEV6/FVC Ratio: 100 %

## 2020-11-19 NOTE — Patient Instructions (Signed)
Full PFT performed today. °

## 2020-11-19 NOTE — Progress Notes (Signed)
Full PFT performed today. °

## 2020-11-19 NOTE — Progress Notes (Signed)
@Patient  ID: Jeremy Clark, male    DOB: Jun 07, 1965, 55 y.o.   MRN: [id number]  Chief Complaint  Patient presents with   Follow-up    F/U after PFT    Referring provider: [name], MD  HPI:   55 year old whom we are seeing in follow up for evaluation of dyspnea on exertion.    Doing better. Denies significant DOE. Did not start Breo. PFTs preformed today - normal. Reviewed with patient. He denies any respiratory issue.  HPI at initial visit: Patient was in good state of health until October 2021.  He noted he used to weigh 440 pounds or so.  Was on a good regimen losing weight.  Had lost 50+ pounds.  Was walking 3 miles a day.  Exercising otherwise.  No breathing issues.  Was diagnosed with prostate cancer around that time and had post biopsy infection complication.  Since then he is far less active.  Notes onset of dyspnea at that point in time.  Not walking regularly, not exercising.  Shortness of breath worse with exertion, inclines or stairs.  Randomly does have shortness of breath at rest.  No cough.  No time of day where things are better or worse.  Denies any seasonal changes contribute to better or worse symptoms.  No environmental changes may account for symptoms.  He notes chronic seasonal allergies, rhinorrhea, watery eyes.  Present during the spring.  Not sure breathing worsened in this setting.  Has tried albuterol as needed without improvement.  Oddly in late April 2022 was found to have retropharyngeal swelling.  Was put on Decadron.  Symptoms for 5 days.  Unsure if this helped his breathing but did note his sugars were really high and felt awful.  Part of the work-up for his symptoms included referral to cardiology.  Cannot see all these notes but so far work-up has been unremarkable per his report.  He had a chest x-ray 07/2020 on my interpretation reveals clear lungs.  He had a CTA PE protocol 07/2020 that shows no PE and clear lungs on my interpretation.  Subsequent  chest x-ray 08/2020 clear on my interpretation as well.  He is finished radiation treatments for his prostate cancer.  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  MMRC: No flowsheet data found.  Epworth:  No flowsheet data found.  Tests:   FENO:  No results found for: NITRICOXIDE  PFT: PFT Results Latest Ref Rng & Units 11/19/2020  FVC-Pre L 2.80  FVC-Predicted Pre % 85  FVC-Post L 2.78  FVC-Predicted Post % 85  Pre FEV1/FVC % % 72  Post FEV1/FCV % % 69  FEV1-Pre L 2.01  FEV1-Predicted Pre % 77  FEV1-Post L 1.92  DLCO uncorrected ml/min/mmHg 20.92  DLCO UNC% % 88  DLVA Predicted % 117  Personally reviewed and interpreted as normal spirometry, no BD response, TLC WNL, DLCO WNL.   WALK:  No flowsheet data found.  Imaging: No results found.   Lab Results: Personally reviewed, no anemia, no significant elevation of eosinophils CBC    Component Value Date/Time   WBC 6.3 09/22/2020 0651  RBC 4.17 (L) 09/22/2020 0651   HGB 13.4 09/22/2020 0651   HCT 37.7 (L) 09/22/2020 0651   PLT 165 09/22/2020 0651   MCV 90.4 09/22/2020 0651   MCH 32.1 09/22/2020 0651   MCHC 35.5 09/22/2020 0651   RDW 13.7 09/22/2020 0651   LYMPHSABS 0.7 09/22/2020 0651   MONOABS 0.4 09/22/2020 0651   EOSABS 0.0 09/22/2020 0651   BASOSABS 0.0 09/22/2020 0651    BMET    Component Value Date/Time   NA 134 (L) 09/22/2020 0651   K 3.0 (L) 09/22/2020 0651   CL 95 (L) 09/22/2020 0651   CO2 25 09/22/2020 0651   GLUCOSE 354 (H) 09/22/2020 0651   BUN 9  09/22/2020 0651   CREATININE 0.92 09/22/2020 0651   CALCIUM 8.9 09/22/2020 0651   GFRNONAA >60 09/22/2020 0651   GFRAA >60 06/28/2015 0335    BNP    Component Value Date/Time   BNP 14.0 08/06/2020 1040    ProBNP    Component Value Date/Time   PROBNP 23.7 08/18/2011 0345    Specialty Problems   None  Allergies  Allergen Reactions   Doxycycline Rash    Immunization History  Administered Date(s) Administered   Influenza Inj Mdck Quad With Preservative 03/22/2018   Influenza, Quadrivalent, Recombinant, Inj, Pf 05/18/2017   Influenza, Seasonal, Injecte, Preservative Fre 03/19/2015, 01/29/2016, 03/30/2018   Influenza,trivalent, recombinat, inj, PF 04/16/2011, 02/28/2012, 03/14/2013   PFIZER(Purple Top)SARS-COV-2 Vaccination 08/03/2019, 09/02/2019, 03/12/2020   Pneumococcal Polysaccharide-23 02/28/2012   Tdap 02/10/2011    Past Medical History:  Diagnosis Date   Abdominal hernia    Diabetes mellitus    High cholesterol    Hypertension    Prostate cancer ([place])     Tobacco History: Social History   Tobacco Use  Smoking Status Former   Packs/day: 0.50   Years: 20.00   Pack years: 10.00   Types: Cigarettes   Quit date: 05/31/2003   Years since quitting: 17.4  Smokeless Tobacco Never   Counseling given: Not Answered   Continue to not smoke  Outpatient Encounter Medications as of 11/19/2020  Medication Sig   albuterol (VENTOLIN HFA) 108 (90 Base) MCG/ACT inhaler INHALE 2 PUFFS INTO THE LUNGS EVERY 4 HOURS AS NEEDED   amLODipine (NORVASC) 5 MG tablet Take 1 tablet by mouth 2 (two) times daily.   ASPIRIN LOW DOSE 81 MG EC tablet Take 81 mg by mouth daily.   busPIRone (BUSPAR) 10 MG tablet Take 10 mg by mouth 2 (two) times daily.   Canagliflozin-metFORMIN HCl (INVOKAMET) [phone] MG TABS Take by mouth.   carvedilol (COREG) 12.5 MG tablet Take 12.5 mg by mouth 2 (two) times daily.   dexamethasone 0.5 MG/5ML elixir Take by mouth.   fluticasone (FLONASE) 50 MCG/ACT  nasal spray SHAKE LIQUID AND USE 1 SPRAY IN EACH NOSTRIL DAILY   fluticasone furoate-vilanterol (BREO ELLIPTA) 100-25 MCG/INH AEPB Inhale 1 puff into the lungs daily.   isosorbide mononitrate (IMDUR) 30 MG 24 hr tablet Take 30 mg by mouth daily.   lisinopril (PRINIVIL,ZESTRIL) 30 MG tablet Take 30 mg by mouth daily.   lisinopril (ZESTRIL) 20 MG tablet Take 1 tablet by mouth 2 (two) times daily.   magnesium oxide (MAG-OX) 400 MG tablet Take by mouth.   omeprazole (PRILOSEC OTC) 20 MG tablet Take by mouth.   ondansetron (ZOFRAN-ODT) 4 MG disintegrating tablet Take by mouth.   potassium chloride SA (KLOR-CON) 20 MEQ tablet Take by mouth.   simvastatin (ZOCOR) 20 MG tablet Take 20 mg  by mouth daily.   tamsulosin (FLOMAX) 0.4 MG CAPS capsule Take 0.4 mg by mouth daily.   XIGDUO XR 09-998 MG TB24 Take 1 tablet by mouth 2 (two) times daily.   No facility-administered encounter medications on file as of 11/19/2020.     Review of Systems  Review of Systems  N/a Physical Exam  BP 140/82   Pulse 69   Ht 5\' 4"  (1.626 m)   Wt 293 lb (132.9 kg)   SpO2 100% Comment: on RA  BMI 50.29 kg/m   Wt Readings from Last 5 Encounters:  11/19/20 293 lb (132.9 kg)  10/19/20 280 lb (127 kg)  09/22/20 285 lb 15 oz (129.7 kg)  09/18/20 286 lb (129.7 kg)  08/06/20 (!) 304 lb (137.9 kg)    BMI Readings from Last 5 Encounters:  11/19/20 50.29 kg/m  10/19/20 48.06 kg/m  09/22/20 49.08 kg/m  09/18/20 49.09 kg/m  08/06/20 52.18 kg/m     Physical Exam General: Well-appearing, no acute distress Eyes: EOMI, watery Neck: Supple, no JVP appreciated Cardiovascular: Tachycardic, no murmurs Pulmonary: Clear to auscultation bilaterally, no wheeze or crackle, normal work of breathing Abdomen: Nondistended, bowel sounds present, large central hernia noted Psych: Normal mood, full affect   Assessment & Plan:   Dyspnea on exertion: present since illness 02/2020. Highest suspicion for deconditioning as  not nearly as active (was walking 3 miles a day, etc.). CXR and CT chest clear reducing concern for pulmonary parenchymal disease. No real improvement with albuterol. PFTs normal Has improved with time.    Return if symptoms worsen or fail to improve.   Lanier Clam, MD 11/19/2020

## 2020-11-19 NOTE — Patient Instructions (Signed)
Nice to see you!  The breathing tests are normal! This is great news.  I am glad you are feeling better, no need for follow up unless you develop new symptoms.  Follow up as needed.

## 2020-11-25 ENCOUNTER — Ambulatory Visit
Admission: RE | Admit: 2020-11-25 | Discharge: 2020-11-25 | Disposition: A | Discharge status: Home / Self Care | Payer: 59 | Source: Ambulatory Visit | Attending: Urology | Admitting: Urology

## 2020-11-25 ENCOUNTER — Other Ambulatory Visit: Payer: Self-pay

## 2020-11-25 DIAGNOSIS — C61 Malignant neoplasm of prostate: Secondary | ICD-10-CM

## 2020-11-25 NOTE — Progress Notes (Signed)
Patient is aware that this is a phone visit. Meaningful use is complete.Patient reports some dysuria.Denies any hematuria. Reports nocturia 4-5. Patient states that he has some leakage and urgency. Patient states that he empties his bladder with urination.Patient reports a moderate stream. Patient denies having to push or strain to start stream. Patient states that he pauses when he is voiding and then he restarts. Patient states that he has some constipation,states that he is going to start taking a stool softener.  Patients next appointments with his urologist is August 11.2022.Patients IPPS was a 7.

## 2020-11-25 NOTE — Progress Notes (Signed)
Radiation Oncology         (336) [phone] ________________________________  Name: Jeremy Clark MRN: [id number]  Date: 11/25/2020  DOB: 18-Feb-1966  Post Treatment Note  CC: [name], MD  Jeremy Clark*  Diagnosis:   55 y.o. gentleman with Stage T1c adenocarcinoma of the prostate with Gleason Score of 3+4, and PSA of 11.8.  Interval Since Last Radiation:  6 weeks  09/03/20 - 10/14/20:  The prostate was treated to 70 Gy in 28 fractions of 2.5 Gy  Narrative:  I spoke with the patient to conduct his routine scheduled 1 month follow up visit via telephone to spare the patient unnecessary potential exposure in the healthcare setting during the current COVID-19 pandemic.  The patient was notified in advance and gave permission to proceed with this visit format.  He tolerated radiation treatment relatively well with only minor urinary irritation and modest fatigue.  He did experience increased nocturia 5-7 times per night, mild dysuria, increased urgency and weak stream but denied having to strain to void, hesitancy or incomplete bladder emptying.  He did not develop any diarrhea but did have some occasional nausea.                              On review of systems, the patient states that he is doing well in general.  He continues with some occasional mild dysuria at the start of his stream but denies any gross hematuria or incomplete bladder emptying.  He reports nocturia 4-5 times per night, increased urgency with occasional small-volume urge incontinence and intermittency.  He denies have to strain to void and feels like he has a moderate flow of stream.  He has some occasional constipation but denies abdominal pain, nausea, vomiting or diarrhea.  He is planning to start using a stool softener to help with the constipation.  The nausea that he experienced during treatment has fully resolved and his appetite is pretty much back to normal at this point.  His energy level is gradually  improving and overall, he is pleased with his progress to date.  ALLERGIES:  is allergic to doxycycline.  Meds: Current Outpatient Medications  Medication Sig Dispense Refill   amLODipine (NORVASC) 5 MG tablet Take 1 tablet by mouth 2 (two) times daily.     ASPIRIN LOW DOSE 81 MG EC tablet Take 81 mg by mouth daily.     busPIRone (BUSPAR) 10 MG tablet Take 10 mg by mouth 2 (two) times daily.     carvedilol (COREG) 12.5 MG tablet Take 12.5 mg by mouth 2 (two) times daily.     isosorbide mononitrate (IMDUR) 30 MG 24 hr tablet Take 30 mg by mouth daily.     lisinopril (PRINIVIL,ZESTRIL) 30 MG tablet Take 30 mg by mouth daily.     ondansetron (ZOFRAN-ODT) 4 MG disintegrating tablet Take by mouth.     potassium chloride SA (KLOR-CON) 20 MEQ tablet Take by mouth.     simvastatin (ZOCOR) 20 MG tablet Take 20 mg by mouth daily.     XIGDUO XR 09-998 MG TB24 Take 1 tablet by mouth 2 (two) times daily.     albuterol (VENTOLIN HFA) 108 (90 Base) MCG/ACT inhaler INHALE 2 PUFFS INTO THE LUNGS EVERY 4 HOURS AS NEEDED (Patient not taking: Reported on 11/25/2020)     Canagliflozin-metFORMIN HCl (INVOKAMET) [phone] MG TABS Take by mouth. (Patient not taking: Reported on 11/25/2020)  clindamycin (CLEOCIN) 75 MG/5ML solution  (Patient not taking: Reported on 11/25/2020)     dexamethasone 0.5 MG/5ML elixir Take by mouth. (Patient not taking: Reported on 11/25/2020)     fluticasone (FLONASE) 50 MCG/ACT nasal spray SHAKE LIQUID AND USE 1 SPRAY IN EACH NOSTRIL DAILY (Patient not taking: Reported on 11/25/2020)     fluticasone furoate-vilanterol (BREO ELLIPTA) 100-25 MCG/INH AEPB Inhale 1 puff into the lungs daily. (Patient not taking: Reported on 11/25/2020) 28 each 0   lisinopril (ZESTRIL) 20 MG tablet Take 1 tablet by mouth 2 (two) times daily. (Patient not taking: Reported on 11/25/2020)     magnesium oxide (MAG-OX) 400 MG tablet Take by mouth. (Patient not taking: Reported on 11/25/2020)     omeprazole (PRILOSEC  OTC) 20 MG tablet Take by mouth. (Patient not taking: Reported on 11/25/2020)     tamsulosin (FLOMAX) 0.4 MG CAPS capsule Take 0.4 mg by mouth daily. (Patient not taking: Reported on 11/25/2020)     No current facility-administered medications for this encounter.    Physical Findings:  vitals were not taken for this visit.   /Unable to assess due to telephone follow-up visit format.  Lab Findings: Lab Results  Component Value Date   WBC 6.3 09/22/2020   HGB 13.4 09/22/2020   HCT 37.7 (L) 09/22/2020   MCV 90.4 09/22/2020   PLT 165 09/22/2020     Radiographic Findings: No results found.  Impression/Plan: 1. 55 y.o. gentleman with Stage T1c adenocarcinoma of the prostate with Gleason Score of 3+4, and PSA of 11.8. He will continue to follow up with urology for ongoing PSA determinations and has an appointment scheduled with Dr. [name] on 01/07/2021. He understands what to expect with regards to PSA monitoring going forward. I will look forward to following his response to treatment via correspondence with urology, and would be happy to continue to participate in his care if clinically indicated. I talked to the patient about what to expect in the future, including his risk for erectile dysfunction and rectal bleeding. I encouraged him to call or return to the office if he has any questions regarding his previous radiation or possible radiation side effects. He was comfortable with this plan and will follow up as needed.     Jeremy Johns, PA-C

## 2020-11-25 NOTE — Progress Notes (Signed)
  Radiation Oncology         (336) [phone] ________________________________  Name: Jeremy Clark MRN: [id number]  Date: 10/14/2020  DOB: 1965/07/20  End of Treatment Note  Diagnosis:   55 y.o. gentleman with Stage T1c adenocarcinoma of the prostate with Gleason Score of 3+4, and PSA of 11.8.     Indication for treatment:  Curative, Definitive Radiotherapy       Radiation treatment dates:   09/03/20 - 10/14/20  Site/dose:   The prostate was treated to 70 Gy in 28 fractions of 2.5 Gy  Beams/energy:   The patient was treated with IMRT using volumetric arc therapy delivering 6 MV X-rays to clockwise and counterclockwise circumferential arcs with a 90 degree collimator offset to avoid dose scalloping.  Image guidance was performed with daily cone beam CT prior to each fraction to align to gold markers in the prostate and assure proper bladder and rectal fill volumes.  Immobilization was achieved with [employer] custom mold.  Narrative: The patient tolerated radiation treatment relatively well with only minor urinary irritation and modest fatigue.  He did experience increased nocturia 5-7 times per night, mild dysuria, increased urgency and weak stream but denied having to strain to void, hesitancy or incomplete bladder emptying.  He did not develop any diarrhea but did have some occasional nausea.  Plan: The patient has completed radiation treatment. He will return to radiation oncology clinic for routine followup in one month. I advised him to call or return sooner if he has any questions or concerns related to his recovery or treatment. ________________________________  Sheral Apley. Tammi Klippel, M.D.

## 2020-11-26 ENCOUNTER — Ambulatory Visit: Payer: Self-pay | Admitting: Urology

## 2021-01-14 ENCOUNTER — Other Ambulatory Visit: Payer: Self-pay | Admitting: Family Medicine

## 2021-01-14 DIAGNOSIS — R9089 Other abnormal findings on diagnostic imaging of central nervous system: Secondary | ICD-10-CM

## 2021-01-14 DIAGNOSIS — R531 Weakness: Secondary | ICD-10-CM

## 2021-01-15 ENCOUNTER — Observation Stay (HOSPITAL_BASED_OUTPATIENT_CLINIC_OR_DEPARTMENT_OTHER)
Admission: EM | Admit: 2021-01-15 | Discharge: 2021-01-17 | Disposition: A | Discharge status: Home / Self Care | Payer: 59 | Attending: Internal Medicine | Admitting: Internal Medicine

## 2021-01-15 ENCOUNTER — Emergency Department (HOSPITAL_BASED_OUTPATIENT_CLINIC_OR_DEPARTMENT_OTHER): Payer: 59

## 2021-01-15 ENCOUNTER — Other Ambulatory Visit: Payer: Self-pay

## 2021-01-15 ENCOUNTER — Encounter (HOSPITAL_BASED_OUTPATIENT_CLINIC_OR_DEPARTMENT_OTHER): Payer: Self-pay | Admitting: Emergency Medicine

## 2021-01-15 DIAGNOSIS — Z8546 Personal history of malignant neoplasm of prostate: Secondary | ICD-10-CM | POA: Insufficient documentation

## 2021-01-15 DIAGNOSIS — R200 Anesthesia of skin: Secondary | ICD-10-CM | POA: Diagnosis present

## 2021-01-15 DIAGNOSIS — I10 Essential (primary) hypertension: Secondary | ICD-10-CM | POA: Insufficient documentation

## 2021-01-15 DIAGNOSIS — Z87891 Personal history of nicotine dependence: Secondary | ICD-10-CM | POA: Diagnosis not present

## 2021-01-15 DIAGNOSIS — Z7982 Long term (current) use of aspirin: Secondary | ICD-10-CM | POA: Diagnosis not present

## 2021-01-15 DIAGNOSIS — Z79899 Other long term (current) drug therapy: Secondary | ICD-10-CM | POA: Diagnosis not present

## 2021-01-15 DIAGNOSIS — E119 Type 2 diabetes mellitus without complications: Secondary | ICD-10-CM | POA: Insufficient documentation

## 2021-01-15 DIAGNOSIS — Y900 Blood alcohol level of less than 20 mg/100 ml: Secondary | ICD-10-CM | POA: Diagnosis not present

## 2021-01-15 DIAGNOSIS — E785 Hyperlipidemia, unspecified: Secondary | ICD-10-CM | POA: Diagnosis present

## 2021-01-15 DIAGNOSIS — I639 Cerebral infarction, unspecified: Secondary | ICD-10-CM | POA: Diagnosis not present

## 2021-01-15 DIAGNOSIS — F411 Generalized anxiety disorder: Secondary | ICD-10-CM | POA: Diagnosis not present

## 2021-01-15 DIAGNOSIS — Z20822 Contact with and (suspected) exposure to covid-19: Secondary | ICD-10-CM | POA: Insufficient documentation

## 2021-01-15 LAB — CBC
HCT: 41.9 % (ref 39.0–52.0)
Hemoglobin: 14.3 g/dL (ref 13.0–17.0)
MCH: 31.4 pg (ref 26.0–34.0)
MCHC: 34.1 g/dL (ref 30.0–36.0)
MCV: 91.9 fL (ref 80.0–100.0)
Platelets: 227 10*3/uL (ref 150–400)
RBC: 4.56 MIL/uL (ref 4.22–5.81)
RDW: 12.5 % (ref 11.5–15.5)
WBC: 4.3 10*3/uL (ref 4.0–10.5)
nRBC: 0 % (ref 0.0–0.2)

## 2021-01-15 LAB — COMPREHENSIVE METABOLIC PANEL
ALT: 20 U/L (ref 0–44)
AST: 19 U/L (ref 15–41)
Albumin: 4 g/dL (ref 3.5–5.0)
Alkaline Phosphatase: 67 U/L (ref 38–126)
Anion gap: 9 (ref 5–15)
BUN: 15 mg/dL (ref 6–20)
CO2: 26 mmol/L (ref 22–32)
Calcium: 9.3 mg/dL (ref 8.9–10.3)
Chloride: 104 mmol/L (ref 98–111)
Creatinine, Ser: 1.09 mg/dL (ref 0.61–1.24)
GFR, Estimated: >60 mL/min (ref >60)
Glucose, Bld: 105 mg/dL — ABNORMAL HIGH (ref 70–99)
Potassium: 4.2 mmol/L (ref 3.5–5.1)
Sodium: 139 mmol/L (ref 135–145)
Total Bilirubin: 0.4 mg/dL (ref 0.3–1.2)
Total Protein: 7.9 g/dL (ref 6.5–8.1)

## 2021-01-15 LAB — DIFFERENTIAL
Abs Immature Granulocytes: 0.01 10*3/uL (ref 0.00–0.07)
Basophils Absolute: 0 10*3/uL (ref 0.0–0.1)
Basophils Relative: 1 %
Eosinophils Absolute: 0 10*3/uL (ref 0.0–0.5)
Eosinophils Relative: 1 %
Immature Granulocytes: 0 %
Lymphocytes Relative: 22 %
Lymphs Abs: 0.9 10*3/uL (ref 0.7–4.0)
Monocytes Absolute: 0.6 10*3/uL (ref 0.1–1.0)
Monocytes Relative: 14 %
Neutro Abs: 2.7 10*3/uL (ref 1.7–7.7)
Neutrophils Relative %: 62 %

## 2021-01-15 LAB — RESP PANEL BY RT-PCR (FLU A&B, COVID) ARPGX2
Influenza A by PCR: NEGATIVE
Influenza B by PCR: NEGATIVE
SARS Coronavirus 2 by RT PCR: NEGATIVE

## 2021-01-15 LAB — APTT: aPTT: 28 seconds (ref 24–36)

## 2021-01-15 LAB — PROTIME-INR
INR: 1 (ref 0.8–1.2)
Prothrombin Time: 13.1 seconds (ref 11.4–15.2)

## 2021-01-15 LAB — ETHANOL: Alcohol, Ethyl (B): <10 mg/dL (ref <10)

## 2021-01-15 MED ORDER — CLOPIDOGREL BISULFATE 75 MG PO TABS
75.0000 mg | ORAL_TABLET | Freq: Once | ORAL | Status: AC
  Administered 2021-01-15: 75 mg via ORAL
  Filled 2021-01-15: qty 1

## 2021-01-15 MED ORDER — ACETAMINOPHEN 325 MG PO TABS: 650.0000 mg | ORAL_TABLET | Freq: Four times a day (QID) | ORAL | Status: DC | PRN

## 2021-01-15 MED ORDER — AMLODIPINE BESYLATE 5 MG PO TABS
5.0000 mg | ORAL_TABLET | Freq: Two times a day (BID) | ORAL | Status: DC
  Administered 2021-01-15 – 2021-01-17 (×5): 5 mg via ORAL
  Filled 2021-01-15 (×5): qty 1

## 2021-01-15 MED ORDER — LISINOPRIL 20 MG PO TABS
30.0000 mg | ORAL_TABLET | Freq: Every day | ORAL | Status: DC
  Administered 2021-01-15: 30 mg via ORAL
  Filled 2021-01-15: qty 3

## 2021-01-15 MED ORDER — CLOPIDOGREL BISULFATE 75 MG PO TABS: 75.0000 mg | ORAL_TABLET | Freq: Every day | ORAL | Status: DC

## 2021-01-15 MED ORDER — INSULIN ASPART 100 UNIT/ML IJ SOLN
0.0000 [IU] | Freq: Three times a day (TID) | INTRAMUSCULAR | Status: DC
  Administered 2021-01-16: 2 [IU] via SUBCUTANEOUS
  Administered 2021-01-17 (×2): 1 [IU] via SUBCUTANEOUS

## 2021-01-15 MED ORDER — BUSPIRONE HCL 10 MG PO TABS
10.0000 mg | ORAL_TABLET | Freq: Two times a day (BID) | ORAL | Status: DC
  Administered 2021-01-15: 10 mg via ORAL
  Filled 2021-01-15 (×2): qty 1

## 2021-01-15 MED ORDER — ACETAMINOPHEN 650 MG RE SUPP: 650.0000 mg | Freq: Four times a day (QID) | RECTAL | Status: DC | PRN

## 2021-01-15 MED ORDER — CARVEDILOL 12.5 MG PO TABS
12.5000 mg | ORAL_TABLET | Freq: Two times a day (BID) | ORAL | Status: DC
  Administered 2021-01-15 – 2021-01-17 (×5): 12.5 mg via ORAL
  Filled 2021-01-15 (×5): qty 1

## 2021-01-15 MED ORDER — STROKE: EARLY STAGES OF RECOVERY BOOK
Freq: Once | Status: AC
  Filled 2021-01-15: qty 1

## 2021-01-15 MED ORDER — METFORMIN HCL ER 500 MG PO TB24: 1000.0000 mg | ORAL_TABLET | Freq: Two times a day (BID) | ORAL | Status: DC

## 2021-01-15 MED ORDER — DAPAGLIFLOZIN PRO-METFORMIN ER 5-1000 MG PO TB24: 1.0000 | ORAL_TABLET | Freq: Two times a day (BID) | ORAL | Status: DC

## 2021-01-15 MED ORDER — SIMVASTATIN 20 MG PO TABS
20.0000 mg | ORAL_TABLET | Freq: Every day | ORAL | Status: DC
  Administered 2021-01-15 – 2021-01-16 (×3): 20 mg via ORAL
  Filled 2021-01-15 (×2): qty 1

## 2021-01-15 MED ORDER — ASPIRIN EC 81 MG PO TBEC
81.0000 mg | DELAYED_RELEASE_TABLET | Freq: Every day | ORAL | Status: DC
  Administered 2021-01-15 – 2021-01-17 (×3): 81 mg via ORAL
  Filled 2021-01-15 (×3): qty 1

## 2021-01-15 MED ORDER — IOHEXOL 350 MG/ML SOLN
100.0000 mL | Freq: Once | INTRAVENOUS | Status: AC | PRN
  Administered 2021-01-15: 100 mL via INTRAVENOUS

## 2021-01-15 NOTE — H&P (Signed)
History and Physical    PLEASE NOTE THAT DRAGON DICTATION SOFTWARE WAS USED IN THE CONSTRUCTION OF THIS NOTE.   Jeremy Clark [id number] DOB: November 24, 1965 DOA: 01/15/2021  PCP: [name], MD Patient coming from: home   I have personally briefly reviewed patient's old medical records in [place]  Chief Complaint: Right-sided weakness  HPI: Jeremy Clark is a 55 y.o. male with medical history significant for hypertension, type 2 diabetes mellitus, hyperlipidemia who is admitted to [facility] on 01/15/2021 by way of transfer from [place] ED for further evaluation and management of acute ischemic CVA after presenting from home to the latter facility complaining of right-sided weakness.   The patient reports that he awoke on the morning of Wednesday, 01/13/2021 at approximately 800 a.m. with new onset weakness involving the right upper extremity and right lower extremity associated with new onset numbness involving both these extremities.  The patient reports that he was completely asymptomatic when he went to bed the evening before at approximately 9:30 PM, and is never previously experienced symptoms similar to this.  He denies any associated acute weakness involving of the left upper or left lower extremity.  No additional acute sensory change, including no additional numbness or paresthesias.  He denies any associated dizziness, vertigo, nausea, vomiting, acute change in vision, blurry vision, diplopia, word finding difficulties.  Denies any associated facial droop or slurring of speech.  He notes a transient headache 2 days ago, which is subsequently resolved without ensuing recurrence.  Denies any associated chest pain, shortness of breath, palpitations, diaphoresis, presyncope, or syncope.    No prior history of stroke.  In terms of modifiable ischemic CVA risk factors, the patient confirms underlying diagnoses of essential hypertension, type 2 diabetes  mellitus, and hyperlipidemia, and notes good compliance on home simvastatin 20 mg p.o. daily.  He is on a daily baby aspirin at home, but otherwise on no blood thinners.  He reports that he is a former smoker, having smoked half pack per day for 20 years before completely quitting smoking in 2005 without subsequent recurrence.  Denies any known history of atrial fibrillation, obstructive sleep apnea.  No recent subjective fever, chills, rigors, or generalized myalgias.  Denies any recent dysuria, gross hematuria, or change in urinary urgency/frequency.  In the setting of this new onset right hemiparesis as well as right-sided numbness, the patient underwent outpatient CT head after presenting to [facility] ER with the above complaint, with associated CT head showing subacute ischemic lesions.  Patient was discharged to home from [facility] ER, and subsequently underwent outpatient MRI of the brain on 01/14/2021 as ordered by his PCP, which showed acute left parietal infarct, prompting patient's PCP to instruct the patient to present to the local emergency department for further evaluation and management of acute ischemic CVA.  Consequently the patient presented to [place] ED earlier today for further evaluation of the above.    [facility] ED Course:  Vital signs in the ED were notable for the following: Tetramex 98.9, heart rate 73-86, blood pressure 131/74 -167/99; respiratory rate 18-22, oxygen saturation 97 to 100% on room air.  Labs were notable for the following: CMP was notable for the following: Sodium 139, creatinine 1.09, glucose 105, and liver enzymes were found to be within normal limits.  CBC notable for white blood cell count 4300, hemoglobin 14.3, platelets 227.  INR 1.0.  Screening nasopharyngeal COVID-19 PCR performed in the ED today was  found to be negative.  EKG showed a sinus rhythm, heart rate 74, normal intervals, nonspecific T wave inversion in lead III, and no evidence  of ST changes, including no evidence of ST elevation.   The ED [place] consulted the on-call neurologist, Dr. Quinn Axe, who recommended CTA of the head and neck as well as admission to [facility] for further evaluation and management of acute ischemic CVA, including evaluation of potential acute ischemic CVA modifiable risk factors.  She also recommended initiation of dual antiplatelet therapy on daily baby aspirin as well as Plavix.  Ensuing CTA head and neck with contrast showed no evidence of large vessel occlusion or high-grade stenosis.   While in [facility] ED, the following were administered: Aspirin 81 mg p.o. x1, Plavix 75 mg p.o. x1, Norvasc 5 mg p.o. x1, Coreg 12.5 mg p.o. x1, lisinopril 30 mg p.o. x1, and simvastatin 20 mg p.o. x1.   Upon arrival at [facility], the patient reports that his right sided hemiparesis has completely resolved.  Additionally, he notes significant improvement in his initial numbness involving the right upper and lower extremities, noting subtle residual paresthesias in the distal aspect of his right hand, but otherwise no residual sensory deficit.     Review of Systems: As per HPI otherwise 10 point review of systems negative.   Past Medical History:  Diagnosis Date   Abdominal hernia    Diabetes mellitus    High cholesterol    Hypertension    Prostate cancer [facility])     Past Surgical History:  Procedure Laterality Date   PROSTATE BIOPSY     SHOULDER SURGERY     right    Social History:  reports that he quit smoking about 17 years ago. His smoking use included cigarettes. He has a 10.00 pack-year smoking history. He has never used smokeless tobacco. He reports current alcohol use. He reports that he does not use drugs.   Allergies  Allergen Reactions   Doxycycline Rash    Family History  Problem Relation Age of Onset   Hypertension Mother    Prostate cancer Father    Aneurysm Daughter    Colon cancer Neg Hx    Pancreatic cancer  Neg Hx    Breast cancer Neg Hx     Family history reviewed and not pertinent    Prior to Admission medications   Medication Sig Start Date End Date Taking? Authorizing Provider  amLODipine (NORVASC) 5 MG tablet Take 5 mg by mouth 2 (two) times daily. 07/31/20  Yes [provider]  ASPIRIN LOW DOSE 81 MG EC tablet Take 81 mg by mouth daily. 09/10/20  Yes [provider]  busPIRone (BUSPAR) 10 MG tablet Take 10 mg by mouth 2 (two) times daily.   Yes [provider]  carvedilol (COREG) 12.5 MG tablet Take 12.5 mg by mouth 2 (two) times daily. 10/28/20  Yes [provider]  lisinopril (PRINIVIL,ZESTRIL) 30 MG tablet Take 30 mg by mouth daily.   Yes [provider]  OZEMPIC, 2 MG/DOSE, 8 MG/3ML SOPN Inject 2 mg into the skin once a week. Mondays 12/08/20  Yes [provider]  simvastatin (ZOCOR) 20 MG tablet Take 20 mg by mouth daily.   Yes [provider]  XIGDUO XR 09-998 MG TB24 Take 1 tablet by mouth 2 (two) times daily. 06/16/20  Yes [provider]  fluticasone furoate-vilanterol (BREO ELLIPTA) 100-25 MCG/INH AEPB Inhale 1 puff into the lungs daily. Patient not taking:  No sig reported 10/19/20   Hunsucker, Bonna Gains, MD     Objective    Physical Exam: Vitals:   01/15/21 1720 01/15/21 1730 01/15/21 1846 01/15/21 2001  BP:  (!) 153/91 (!) 156/94 (!) 139/91  Pulse:  85 73 73  Resp:  (!) '21 18 20  '$ Temp: 98.7 F (37.1 C)  98.9 F (37.2 C) 97.9 F (36.6 C)  TempSrc:   Oral Oral  SpO2:  100% 100% 90%  Weight:      Height:        General: appears to be stated age; alert, oriented Skin: warm, dry, no rash Head:  AT/[place] Mouth:  Oral mucosa membranes appear moist, normal dentition Neck: supple; trachea midline Heart:  RRR; did not appreciate any M/R/G Lungs: CTAB, did not appreciate any wheezes, rales, or rhonchi Abdomen: + BS; soft, ND, NT Vascular: 2+ pedal pulses b/l; 2+ radial pulses b/l Extremities: no  peripheral edema, no muscle wasting Neuro: 5/5 strength of the proximal and distal flexors and extensors of the upper and lower extremities bilaterally; sensation intact in upper and lower extremities b/l, with the exception of paresthesias noted in the distal aspect of the right hand; cranial nerves II through XII grossly intact; no pronator drift; no evidence suggestive of slurred speech, dysarthria, or facial droop; Normal muscle tone. No tremors.    Labs on Admission: I have personally reviewed following labs and imaging studies  CBC: Recent Labs  Lab 01/15/21 1027  WBC 4.3  NEUTROABS 2.7  HGB 14.3  HCT 41.9  MCV 91.9  PLT [id number]   Basic Metabolic Panel: Recent Labs  Lab 01/15/21 1027  NA 139  K 4.2  CL 104  CO2 26  GLUCOSE 105*  BUN 15  CREATININE 1.09  CALCIUM 9.3   GFR: Estimated Creatinine Clearance: 95.3 mL/min (by C-G formula based on SCr of 1.09 mg/dL). Liver Function Tests: Recent Labs  Lab 01/15/21 1027  AST 19  ALT 20  ALKPHOS 67  BILITOT 0.4  PROT 7.9  ALBUMIN 4.0   No results for input(s): LIPASE, AMYLASE in the last 168 hours. No results for input(s): AMMONIA in the last 168 hours. Coagulation Profile: Recent Labs  Lab 01/15/21 1115  INR 1.0   Cardiac Enzymes: No results for input(s): CKTOTAL, CKMB, CKMBINDEX, TROPONINI in the last 168 hours. BNP (last 3 results) No results for input(s): PROBNP in the last 8760 hours. HbA1C: No results for input(s): HGBA1C in the last 72 hours. CBG: No results for input(s): GLUCAP in the last 168 hours. Lipid Profile: No results for input(s): CHOL, HDL, LDLCALC, TRIG, CHOLHDL, LDLDIRECT in the last 72 hours. Thyroid Function Tests: No results for input(s): TSH, T4TOTAL, FREET4, T3FREE, THYROIDAB in the last 72 hours. Anemia Panel: No results for input(s): VITAMINB12, FOLATE, FERRITIN, TIBC, IRON, RETICCTPCT in the last 72 hours. Urine analysis:    Component Value Date/Time   COLORURINE YELLOW  11/21/2019 0544   APPEARANCEUR CLEAR 11/21/2019 0544   LABSPEC 1.027 11/21/2019 0544   PHURINE 6.0 11/21/2019 0544   GLUCOSEU >=500 (A) 11/21/2019 0544   HGBUR NEGATIVE 11/21/2019 0544   BILIRUBINUR NEGATIVE 11/21/2019 0544   KETONESUR NEGATIVE 11/21/2019 0544   PROTEINUR NEGATIVE 11/21/2019 0544   UROBILINOGEN 1.0 12/07/2007 1440   NITRITE NEGATIVE 11/21/2019 0544   LEUKOCYTESUR NEGATIVE 11/21/2019 0544    Radiological Exams on Admission: CT ANGIO HEAD NECK W WO CM  Result Date: 01/15/2021 CLINICAL DATA:  Neuro deficit, acute, stroke suspected. Additional history provided: Patient  reports stroke on Wednesday with MRI yesterday, right arm numbness and limited range of motion since Wednesday. EXAM: CT ANGIOGRAPHY HEAD AND NECK TECHNIQUE: Multidetector CT imaging of the head and neck was performed using the standard protocol during bolus administration of intravenous contrast. Multiplanar CT image reconstructions and MIPs were obtained to evaluate the vascular anatomy. Carotid stenosis measurements (when applicable) are obtained utilizing NASCET criteria, using the distal internal carotid diameter as the denominator. CONTRAST:  146m OMNIPAQUE IOHEXOL 350 MG/ML SOLN COMPARISON:  Head CT 08/10/2011. FINDINGS: CT HEAD FINDINGS Brain: Cerebral volume is normal for age. There are small foci of hypodensity within the left frontal lobe periventricular white matter (series 7, image 18) and left temporoparietal periventricular white matter (series 7, image 16) likely reflecting acute/early subacute infarcts. Background mild patchy and ill-defined hypoattenuation within the cerebral white matter, nonspecific but compatible with chronic small vessel ischemic disease. There is no acute intracranial hemorrhage. No evidence of an intracranial mass. No extra-axial fluid collection. No midline shift. Vascular: No hyperdense vessel.  Atherosclerotic calcifications. Skull: Normal. Negative for fracture or focal  lesion. Sinuses: No significant paranasal sinus disease. Orbits: No mass or acute finding. Review of the MIP images confirms the above findings CTA NECK FINDINGS Aortic arch: Common origin of the innominate and left common carotid arteries. Streak artifact arising from a dense left-sided contrast bolus partially obscures the left subclavian artery. Within this limitation, there is no acute at least significant stenosis within the innominate or proximal subclavian arteries. Right carotid system: CCA and ICA patent within the neck without stenosis or significant atherosclerotic disease. Partially retropharyngeal course of the ICA. Left carotid system: CCA and ICA patent within the neck without stenosis or significant atherosclerotic disease. Partially retropharyngeal course of the ICA. Vertebral arteries: The vertebral arteries are developmentally diminutive, but patent throughout the neck. Skeleton: Cervical spondylosis. No acute bony abnormality or aggressive osseous lesion. Congenital nonunion of the posterior arch of C1. Cervicothoracic levocurvature. Other neck: No neck mass or cervical lymphadenopathy. Thyroid unremarkable. Upper chest: No consolidation within the imaged lung apices. Review of the MIP images confirms the above findings CTA HEAD FINDINGS Anterior circulation: The intracranial internal carotid arteries are patent. Minimal non-stenotic calcified plaque within the cavernous segment on the left. The M1 middle cerebral arteries are patent. Atherosclerotic irregularity of the M2 and more distal middle cerebral artery vessels bilaterally. No high-grade proximal M2 stenosis is identified. The anterior cerebral arteries are patent. No intracranial aneurysm is identified. Posterior circulation: The intracranial vertebral arteries and basilar artery are developmentally diminutive, but patent. The posterior cerebral arteries are patent. Sizable posterior communicating arteries are present bilaterally. Venous  sinuses: Within the limitations of contrast timing, no convincing thrombus. Anatomic variants: As described Review of the MIP images confirms the above findings IMPRESSION: CT head: 1. Small foci of hypodensity within the left frontal lobe periventricular white matter and left temporoparietal periventricular white matter, likely reflecting acute/early subacute infarcts given the provided history (MCA vascular territory). 2. Background mild chronic small vessel ischemic changes within the cerebral white matter. CTA head: 1. The common and internal carotid arteries are patent within the neck without stenosis or significant atherosclerotic disease. 2. The vertebral arteries are developmentally diminutive, but patent throughout the neck. CTA neck: No intracranial large vessel occlusion or proximal high-grade arterial stenosis is identified. Intracranial atherosclerotic disease, as described. Electronically Signed   By: KKellie SimmeringD.O.   On: 01/15/2021 12:55     EKG: Independently reviewed, with result as described above.  Assessment/Plan   Jeremy Clark is a 55 y.o. male with medical history significant for hypertension, type 2 diabetes mellitus, hyperlipidemia who is admitted to [facility] on 01/15/2021 by way of transfer from [place] ED for further evaluation and management of acute ischemic CVA after presenting from home to the latter facility complaining of right-sided weakness.    Principal Problem:   Acute ischemic stroke [facility]) Active Problems:   DM2 (diabetes mellitus, type 2) ([place])   Essential hypertension   Hyperlipidemia   GAD (generalized anxiety disorder)     #) Acute ischemic CVA: dx on the basis of acute onset of right hemiparesis associated with numbness involving the right upper and lower extremities, with last known normal 2130 on 01/12/21, with MRI brain showing evidence of acute left parietal infarct.  Ensuing CTA head/neck with IV contrast showed no  evidence of large vessel high-grade stenosis or occlusion. On-call neurologist, Dr. Quinn Axe, was consulted and recommended initiation of dual antiplatelet therapy as well as transfer to [facility] for further evaluation and management of acute ischemic CVA as well as evaluation for modifiable acute ischemic CVA risk factors.  Of note, patient received baby aspirin as well as Plavix 75 mg p.o. x1 at [place] ED prior to transfer.  No tPA administered at [facility] as the patient presented well outside of the window for consideration for such.   Of note, the patient possesses multiple potentially modifiable ischemic CVA risk factors, including history of type 2 diabetes mellitus, hypertension, and hyperlipidemia.  Additionally, he acknowledges being a former smoker, and completely quit smoking in 2005 without subsequent resumption.  Denies any known history of paroxysmal atrial fibrillation or obstructive sleep apnea.  Presenting EKG shows normal sinus rhythm without evidence of acute ischemic changes.  Upon arrival at [facility] today, we are well outside of the 24 to 48-hour window for observance of permissive hypertension given last known normal on the evening of 01/12/2021, as above.     Plan: Nursing bedside swallow evaluation x 1 now, and will not initiate oral medications or diet until the patient has passed this. Head of the bed at 30 degrees. Neuro checks per protocol. VS per protocol.  Monitor on telemetry, including monitoring for atrial fibrillation as modifiable risk factor for acute ischemic CVA.  Should overnight telemetry not demonstrate evidence of atrial fibrillation, can consider 30-day cardiac event monitor at the time of discharge to further evaluate for this arrhythmia. TTE with bubble study has been ordered for the morning to evaluate for intracardiac thrombus, septal wall aneurysm, or septal wall defect. Check lipid panel and A1c. PT/OT/consults have been ordered to  occur in the morning.  Continue dual antiplatelet therapy, as above.  Continue existing statin.       #) Essential hypertension: Documented history of such on Norvasc, Coreg, lisinopril as an outpatient.  In the setting of presenting acute ischemic CVA, it is notable that we are outside of the 24 to 48-hour window of observance of permissive hypertension, as further detailed above.  Plan: Resume home antihypertensive medications.  Close monitoring of ensuing blood pressure via routine vital signs.       #) Type 2 diabetes mellitus: On dapagliflozin and metformin as a sole outpatient oral hypoglycemic agents.  Not on insulin as an outpatient. Will check hemoglobin A1c as component of modifiable risk factor evaluation in the setting of acute ischemic CVA.  Plan: Check hemoglobin A1c, as above.  Hold home oral hypoglycemic agents during this hospitalization.  Accu-Cheks before every meal and at bedtime with low-dose ion scale insulin have been ordered.       #) Hyperlipidemia: On simvastatin as an outpatient.    Plan: Check lipid panel as component of acute ischemic CVA work-up, as above.  Continue home dose of simvastatin for now.      #) Generalized anxiety disorder: On BuSpar as an outpatient. Will hold BuSpar for now to limit the potential for confounding pharmacologic variable associate with a central acting medication should patient subsequently developed evidence of additional acute neurologic deficits.  Plan: Hold home BuSpar for now, as above.     DVT prophylaxis: SCDs Code Status: Full code Family Communication: none Disposition Plan: Per Rounding Team Consults called: on-call neurologist, Dr. Quinn Axe, was consulted by EDP at [facility]. Upon arrival of patient at [facility] this evening, I notified on-call neurologist, Dr. Lorrin Goodell of the patient's arrival;   Admission status: Observation; med telemetry.     Of note, this patient was added by me to the following  Admit List/Treatment Team: [email or web address].      PLEASE NOTE THAT DRAGON DICTATION SOFTWARE WAS USED IN THE CONSTRUCTION OF THIS NOTE.   [place] [facility] Pager [phone] From [place]  Otherwise, please contact night-coverage  [email or web address] Password [facility]   01/15/2021, 10:42 PM

## 2021-01-15 NOTE — Progress Notes (Signed)
Received a phone call from Facility: [facility]  Requesting MD: Dr. Melina Copa  Patient with h/o T2DM, HLD, HTN, prostate cancer presenting with stroke like symptoms. Got out of bed Wednesday and fell due to right sided weakness.  Seen outpatient with CT that showed subacute/old lesions and question of demyelinating lesion vs. Ischemia.  MRI scan was ordered for outpatient and showed acute infarct left parietal matter. Came to ER to get admitted.  Lab work in [place] wnl. CTA head pending. Vitals stable with some outlying HTN readings.  Neurology consulted. Started on plavix, but needs further work up at [facility].    The patient will be accepted for admission to telemetry at [facility] when bed is available.   Will need to let neurology know when patient arrives. Needs stroke w/u with echo, CTA pending at [facility].    Nursing staff, Please call the [place] number at the top of Amion at the time of the patient's arrival so that the patient can be paged to the admitting physician.   Casimer Bilis, M.D. [facility]

## 2021-01-15 NOTE — ED Triage Notes (Signed)
Pt reports had a stroke on Wednesday and MRI yesterday. Pt reports was told to come to ED and be evaluated further. Pt reports right arm numbness and limited ROM since Wednesday.

## 2021-01-15 NOTE — ED Notes (Signed)
Attempted report 

## 2021-01-15 NOTE — ED Provider Notes (Signed)
[facility] Provider Note   CSN: [id number] Arrival date & time: 01/15/21  [id number]     History Chief Complaint  Patient presents with   Numbness    Jeremy Clark is a 55 y.o. male.  He is here with a complaint of strokelike symptoms for the last 3 days.  He said he woke up 3 days ago with right-sided numbness and fell to the floor.  He went to [facility] 2 days ago and had a CAT scan that showed age-indeterminate infarcts.  His primary care doctor ordered him an MRI which she had yesterday that showed a 15 mm acute infarct in the left parietal white matter.  His PCP instructed him to come to the emergency department for further work-up.  He denies any blurry vision double vision.  He is having some difficulty raising his arm above his head and walks with a little limp.  He says his right arm and right leg are tingling.  No blurry vision double vision.  Had a headache yesterday none today.  No prior history of stroke.  The history is provided by the patient.  Cerebrovascular Accident This is a new problem. The current episode started 2 days ago. The problem occurs constantly. The problem has not changed since onset.Associated symptoms include headaches. Pertinent negatives include no chest pain, no abdominal pain and no shortness of breath. The symptoms are aggravated by walking. Nothing relieves the symptoms. He has tried rest for the symptoms. The treatment provided no relief.      Past Medical History:  Diagnosis Date   Abdominal hernia    Diabetes mellitus    High cholesterol    Hypertension    Prostate cancer [facility])     Patient Active Problem List   Diagnosis Date Noted   Elevated PSA 01/01/2020   Prostate mass 01/01/2020   Prostate cancer ([place]) 01/01/2020   Vitamin D deficiency 11/08/2019   Controlled type 2 diabetes mellitus with complication, with long-term current use of insulin ([place]) 10/15/2018   Essential hypertension 10/15/2018    Hyperlipidemia 10/15/2018   Abdominal wall hernia 03/19/2015   Morbid obesity with BMI of 50.0-59.9, adult ([place]) 03/27/2014    Past Surgical History:  Procedure Laterality Date   PROSTATE BIOPSY     SHOULDER SURGERY     right       Family History  Problem Relation Age of Onset   Hypertension Mother    Prostate cancer Father    Aneurysm Daughter    Colon cancer Neg Hx    Pancreatic cancer Neg Hx    Breast cancer Neg Hx     Social History   Tobacco Use   Smoking status: Former    Packs/day: 0.50    Years: 20.00    Pack years: 10.00    Types: Cigarettes    Quit date: 05/31/2003    Years since quitting: 17.6   Smokeless tobacco: Never  Vaping Use   Vaping Use: Never used  Substance Use Topics   Alcohol use: Yes    Comment: occ   Drug use: No    Home Medications Prior to Admission medications   Medication Sig Start Date End Date Taking? Authorizing Provider  amLODipine (NORVASC) 5 MG tablet Take 1 tablet by mouth 2 (two) times daily. 07/31/20  Yes [provider]  ASPIRIN LOW DOSE 81 MG EC tablet Take 81 mg by mouth daily. 09/10/20  Yes [provider]  busPIRone (BUSPAR) 10 MG tablet  Take 10 mg by mouth 2 (two) times daily.   Yes [provider]  carvedilol (COREG) 12.5 MG tablet Take 12.5 mg by mouth 2 (two) times daily. 10/28/20  Yes [provider]  lisinopril (PRINIVIL,ZESTRIL) 30 MG tablet Take 30 mg by mouth daily.   Yes [provider]  simvastatin (ZOCOR) 20 MG tablet Take 20 mg by mouth daily.   Yes [provider]  XIGDUO XR 09-998 MG TB24 Take 1 tablet by mouth 2 (two) times daily. 06/16/20  Yes [provider]  albuterol (VENTOLIN HFA) 108 (90 Base) MCG/ACT inhaler INHALE 2 PUFFS INTO THE LUNGS EVERY 4 HOURS AS NEEDED Patient not taking: No sig reported 09/10/20   [provider]  Canagliflozin-metFORMIN HCl (INVOKAMET) [phone] MG TABS Take by mouth. Patient not taking: No sig reported  04/30/20   [provider]  clindamycin (CLEOCIN) 75 MG/5ML solution  09/21/20   [provider]  dexamethasone 0.5 MG/5ML elixir Take by mouth. Patient not taking: No sig reported 09/21/20   [provider]  fluticasone (FLONASE) 50 MCG/ACT nasal spray SHAKE LIQUID AND USE 1 SPRAY IN EACH NOSTRIL DAILY Patient not taking: No sig reported 07/31/20   [provider]  fluticasone furoate-vilanterol (BREO ELLIPTA) 100-25 MCG/INH AEPB Inhale 1 puff into the lungs daily. Patient not taking: No sig reported 10/19/20   Hunsucker, Bonna Gains, MD  isosorbide mononitrate (IMDUR) 30 MG 24 hr tablet Take 30 mg by mouth daily. 07/30/20   [provider]  lisinopril (ZESTRIL) 20 MG tablet Take 1 tablet by mouth 2 (two) times daily. Patient not taking: No sig reported 06/30/20   [provider]  magnesium oxide (MAG-OX) 400 MG tablet Take by mouth. Patient not taking: No sig reported 06/17/20   [provider]  omeprazole (PRILOSEC OTC) 20 MG tablet Take by mouth. Patient not taking: No sig reported 05/11/20   [provider]  ondansetron (ZOFRAN-ODT) 4 MG disintegrating tablet Take by mouth. 10/28/20   [provider]  potassium chloride SA (KLOR-CON) 20 MEQ tablet Take by mouth. 10/01/20   [provider]  tamsulosin (FLOMAX) 0.4 MG CAPS capsule Take 0.4 mg by mouth daily. Patient not taking: No sig reported 03/29/20   [provider]    Allergies    Doxycycline  Review of Systems   Review of Systems  Constitutional:  Negative for fever.  HENT:  Negative for sore throat.   Eyes:  Negative for visual disturbance.  Respiratory:  Negative for shortness of breath.   Cardiovascular:  Negative for chest pain.  Gastrointestinal:  Negative for abdominal pain.  Genitourinary:  Negative for dysuria.  Musculoskeletal:  Negative for neck pain.  Skin:  Negative for rash.  Neurological:  Positive for weakness, numbness and  headaches.   Physical Exam Updated Vital Signs BP (!) 167/99 (BP Location: Left Arm)   Pulse 79   Temp 98.3 F (36.8 C) (Oral)   Resp 18   Ht '5\' 4"'$  (1.626 m)   Wt 128.8 kg   SpO2 97%   BMI 48.75 kg/m   Physical Exam Vitals and nursing note reviewed.  Constitutional:      Appearance: Normal appearance. He is well-developed.  HENT:     Head: Normocephalic and atraumatic.  Eyes:     Conjunctiva/sclera: Conjunctivae normal.  Cardiovascular:     Rate and Rhythm: Normal rate and regular rhythm.     Heart sounds: No murmur heard. Pulmonary:     Effort: Pulmonary  effort is normal. No respiratory distress.     Breath sounds: Normal breath sounds.  Abdominal:     Palpations: Abdomen is soft.     Tenderness: There is no abdominal tenderness.     Hernia: A hernia (ventral) is present.  Musculoskeletal:        General: No deformity.     Cervical back: Neck supple.     Right lower leg: Edema present.     Left lower leg: Edema present.  Skin:    General: Skin is warm and dry.  Neurological:     Mental Status: He is alert.     Cranial Nerves: No cranial nerve deficit.     Sensory: No sensory deficit.     Motor: Weakness present.     Comments: Normal cranial nerves and speech.  His right upper arm is slightly weak on shoulder shrug and lifting his arm above his head but his grips are 5 out of 5.  Lower extremity strength seems preserved although when he ambulates he limps a little bit.  He feels like it is a little bit harder to lift his leg up.  Sensation is intact to light touch although he feels paresthesias in his right arm and leg.  No pronator drift.    ED Results / Procedures / Treatments   Labs (all labs ordered are listed, but only abnormal results are displayed) Labs Reviewed  COMPREHENSIVE METABOLIC PANEL - Abnormal; Notable for the following components:      Result Value   Glucose, Bld 105 (*)    All other components within normal limits  RESP PANEL BY RT-PCR (FLU  A&B, COVID) ARPGX2  ETHANOL  CBC  DIFFERENTIAL  PROTIME-INR  APTT  RAPID URINE DRUG SCREEN, HOSP PERFORMED  URINALYSIS, ROUTINE W REFLEX MICROSCOPIC    EKG EKG Interpretation  Date/Time:  Friday January 15 2021 10:27:42 EDT Ventricular Rate:  74 PR Interval:  151 QRS Duration: 91 QT Interval:  399 QTC Calculation: 443 R Axis:   16 Text Interpretation: Sinus rhythm Nonspecific T wave abnormality Confirmed by Aletta Edouard [phone]) on 01/15/2021 10:41:40 AM  Radiology CT ANGIO HEAD NECK W WO CM  Result Date: 01/15/2021 CLINICAL DATA:  Neuro deficit, acute, stroke suspected. Additional history provided: Patient reports stroke on Wednesday with MRI yesterday, right arm numbness and limited range of motion since Wednesday. EXAM: CT ANGIOGRAPHY HEAD AND NECK TECHNIQUE: Multidetector CT imaging of the head and neck was performed using the standard protocol during bolus administration of intravenous contrast. Multiplanar CT image reconstructions and MIPs were obtained to evaluate the vascular anatomy. Carotid stenosis measurements (when applicable) are obtained utilizing NASCET criteria, using the distal internal carotid diameter as the denominator. CONTRAST:  154m OMNIPAQUE IOHEXOL 350 MG/ML SOLN COMPARISON:  Head CT 08/10/2011. FINDINGS: CT HEAD FINDINGS Brain: Cerebral volume is normal for age. There are small foci of hypodensity within the left frontal lobe periventricular white matter (series 7, image 18) and left temporoparietal periventricular white matter (series 7, image 16) likely reflecting acute/early subacute infarcts. Background mild patchy and ill-defined hypoattenuation within the cerebral white matter, nonspecific but compatible with chronic small vessel ischemic disease. There is no acute intracranial hemorrhage. No evidence of an intracranial mass. No extra-axial fluid collection. No midline shift. Vascular: No hyperdense vessel.  Atherosclerotic calcifications. Skull: Normal.  Negative for fracture or focal lesion. Sinuses: No significant paranasal sinus disease. Orbits: No mass or acute finding. Review of the MIP images confirms the above findings CTA NECK FINDINGS  Aortic arch: Common origin of the innominate and left common carotid arteries. Streak artifact arising from a dense left-sided contrast bolus partially obscures the left subclavian artery. Within this limitation, there is no acute at least significant stenosis within the innominate or proximal subclavian arteries. Right carotid system: CCA and ICA patent within the neck without stenosis or significant atherosclerotic disease. Partially retropharyngeal course of the ICA. Left carotid system: CCA and ICA patent within the neck without stenosis or significant atherosclerotic disease. Partially retropharyngeal course of the ICA. Vertebral arteries: The vertebral arteries are developmentally diminutive, but patent throughout the neck. Skeleton: Cervical spondylosis. No acute bony abnormality or aggressive osseous lesion. Congenital nonunion of the posterior arch of C1. Cervicothoracic levocurvature. Other neck: No neck mass or cervical lymphadenopathy. Thyroid unremarkable. Upper chest: No consolidation within the imaged lung apices. Review of the MIP images confirms the above findings CTA HEAD FINDINGS Anterior circulation: The intracranial internal carotid arteries are patent. Minimal non-stenotic calcified plaque within the cavernous segment on the left. The M1 middle cerebral arteries are patent. Atherosclerotic irregularity of the M2 and more distal middle cerebral artery vessels bilaterally. No high-grade proximal M2 stenosis is identified. The anterior cerebral arteries are patent. No intracranial aneurysm is identified. Posterior circulation: The intracranial vertebral arteries and basilar artery are developmentally diminutive, but patent. The posterior cerebral arteries are patent. Sizable posterior communicating arteries  are present bilaterally. Venous sinuses: Within the limitations of contrast timing, no convincing thrombus. Anatomic variants: As described Review of the MIP images confirms the above findings IMPRESSION: CT head: 1. Small foci of hypodensity within the left frontal lobe periventricular white matter and left temporoparietal periventricular white matter, likely reflecting acute/early subacute infarcts given the provided history (MCA vascular territory). 2. Background mild chronic small vessel ischemic changes within the cerebral white matter. CTA head: 1. The common and internal carotid arteries are patent within the neck without stenosis or significant atherosclerotic disease. 2. The vertebral arteries are developmentally diminutive, but patent throughout the neck. CTA neck: No intracranial large vessel occlusion or proximal high-grade arterial stenosis is identified. Intracranial atherosclerotic disease, as described. Electronically Signed   By: Kellie Simmering D.O.   On: 01/15/2021 12:55    Procedures Procedures   Medications Ordered in ED Medications  amLODipine (NORVASC) tablet 5 mg (5 mg Oral Given 01/15/21 1218)  aspirin EC tablet 81 mg (81 mg Oral Given 01/15/21 1218)  busPIRone (BUSPAR) tablet 10 mg (10 mg Oral Not Given 01/15/21 1220)  carvedilol (COREG) tablet 12.5 mg (12.5 mg Oral Given 01/15/21 1218)  lisinopril (ZESTRIL) tablet 30 mg (30 mg Oral Given 01/15/21 1218)  simvastatin (ZOCOR) tablet 20 mg (20 mg Oral Given 01/15/21 1218)  clopidogrel (PLAVIX) tablet 75 mg (75 mg Oral Given 01/15/21 1218)  iohexol (OMNIPAQUE) 350 MG/ML injection 100 mL (100 mLs Intravenous Contrast Given 01/15/21 1159)    ED Course  I have reviewed the triage vital signs and the nursing notes.  Pertinent labs & imaging results that were available during my care of the patient were reviewed by me and considered in my medical decision making (see chart for details).  Clinical Course as of 01/15/21 1748  Fri Jan 15, 2021  0957 CT noncontrast done at [facility] 8/17 -IMPRESSION: Two small age-indeterminate areas of hypoattenuation in the left corona radiata and the left peritrigonal white matter, respectively. These could be due to ischemia/infarction; however, demyelinating disease is also a consideration. MRI of the brain without and with contrast could be obtained  for further evaluation, as clinically warranted.  [MB]  [id number] Discussed with Dr. Rogers Blocker [facility] hospitalist who will put the patient in for a bed at [facility]. [MB]  [id number] Discussed with Dr. Quinn Axe neurology.  She feels the patient will need a CTA head and neck and admission to [facility] for further neurologic work-up. [MB]    Clinical Course User Index [MB] Hayden Rasmussen, MD   MDM Rules/Calculators/A&P                          This patient complains of right-sided numbness and weakness; this involves an extensive number of treatment Options and is a complaint that carries with it a high risk of complications and Morbidity. The differential includes stroke, bleed, metabolic derangement, encephalopathy, hypoglycemia, intoxication  I ordered, reviewed and interpreted labs, which included CBC with normal white count normal hemoglobin chemistries and LFTs normal, COVID and flu testing negative I ordered medication oral Plavix I ordered imaging studies which included CT angio head and neck and I independently    visualized and interpreted imaging which showed no large vessel occlusion  Previous records obtained and reviewed in epic including prior ED visit at [facility] I consulted Dr. Quinn Axe neurology and Dr. Rogers Blocker [facility] hospitalist and discussed lab and imaging findings  Critical Interventions: None  After the interventions stated above, I reevaluated the patient and found patient still to be symptomatic.  He is agreeable to admission to the hospital for further work-up of his acute stroke symptom.   Final Clinical Impression(s) / ED  Diagnoses Final diagnoses:  Acute CVA (cerebrovascular accident) [facility])    Rx / [place] Orders ED Discharge Orders     None        Hayden Rasmussen, MD 01/15/21 1751

## 2021-01-15 NOTE — Consult Note (Signed)
NEUROLOGY CONSULTATION NOTE   Date of service: January 15, 2021 Patient Name: Jeremy Clark MRN:  [id number] DOB:  08/04/1965 Reason for consult: "L parietal stroke" Requesting Provider: Rhetta Mura, DO _ _ _   _ __   _ __ _ _  __ __   _ __   __ _  History of Present Illness  Jeremy Clark is a 55 y.o. male with PMH significant for DM2, HN, HLD, prostrate cancer, who presents with R sided weakness. Got out of bed on Wednesday and fell. He had a MRI brain outpatient which demonstrated a 70m white matter infarct in the R parietal lobe. [facility]  His symptoms have significantly improved here. No prior hx of stroke, no family hx of strokes, reports diabetes is well controlled with HbA1c around 7. Blood pressure usually runs 10000000systolic at home. HLD is diet controlled. He does not smoke. No EtOH.  mRS: 0 tPA/Thrombectomy: oustide window and no symptoms. NIHSS components Score: Comment  1a Level of Conscious 0'[x]'$  1'[]'$  2'[]'$  3'[]'$      1b LOC Questions 0'[x]'$  1'[]'$  2'[]'$       1c LOC Commands 0'[x]'$  1'[]'$  2'[]'$       2 Best Gaze 0'[x]'$  1'[]'$  2'[]'$       3 Visual 0'[x]'$  1'[]'$  2'[]'$  3'[]'$      4 Facial Palsy 0'[x]'$  1'[]'$  2'[]'$  3'[]'$      5a Motor Arm - left 0'[x]'$  1'[]'$  2'[]'$  3'[]'$  4'[]'$  UN'[]'$    5b Motor Arm - Right 0'[x]'$  1'[]'$  2'[]'$  3'[]'$  4'[]'$  UN'[]'$    6a Motor Leg - Left 0'[x]'$  1'[]'$  2'[]'$  3'[]'$  4'[]'$  UN'[]'$    6b Motor Leg - Right 0'[x]'$  1'[]'$  2'[]'$  3'[]'$  4'[]'$  UN'[]'$    7 Limb Ataxia 0'[x]'$  1'[]'$  2'[]'$  3'[]'$  UN'[]'$     8 Sensory 0'[x]'$  1'[]'$  2'[]'$  UN'[]'$      9 Best Language 0'[x]'$  1'[]'$  2'[]'$  3'[]'$      10 Dysarthria 0'[x]'$  1'[]'$  2'[]'$  UN'[]'$      11 Extinct. and Inattention 0'[x]'$  1'[]'$  2'[]'$       TOTAL: 0       ROS   Constitutional Denies weight loss, fever and chills.   HEENT Denies changes in vision and hearing.   Respiratory Denies SOB and cough.   CV Denies palpitations and CP   GI Denies abdominal pain, nausea, vomiting and diarrhea.   GU Denies dysuria and urinary frequency.   MSK Denies myalgia and joint pain.   Skin Denies rash and  pruritus.   Neurological Denies headache and syncope.   Psychiatric Denies recent changes in mood. Denies anxiety and depression.    Past History   Past Medical History:  Diagnosis Date   Abdominal hernia    Diabetes mellitus    High cholesterol    Hypertension    Prostate cancer ([facility]    Past Surgical History:  Procedure Laterality Date   PROSTATE BIOPSY     SHOULDER SURGERY     right   Family History  Problem Relation Age of Onset   Hypertension Mother    Prostate cancer Father    Aneurysm Daughter    Colon cancer Neg Hx    Pancreatic cancer Neg Hx    Breast cancer Neg Hx    Social History   Socioeconomic History   Marital status: Widowed    Spouse name: Not on file   Number of children: 2   Years of education: Not on file   Highest education level: Not  on file  Occupational History   Occupation: Freight forwarder    Comment: full time  Tobacco Use   Smoking status: Former    Packs/day: 0.50    Years: 20.00    Pack years: 10.00    Types: Cigarettes    Quit date: 05/31/2003    Years since quitting: 17.6   Smokeless tobacco: Never  Vaping Use   Vaping Use: Never used  Substance and Sexual Activity   Alcohol use: Yes    Comment: occ   Drug use: No   Sexual activity: Not on file  Other Topics Concern   Not on file  Social History Narrative   Not on file   Social Determinants of Health   Financial Resource Strain: Not on file  Food Insecurity: Not on file  Transportation Needs: Not on file  Physical Activity: Not on file  Stress: Not on file  Social Connections: Not on file   Allergies  Allergen Reactions   Doxycycline Rash    Medications   Medications Prior to Admission  Medication Sig Dispense Refill Last Dose   amLODipine (NORVASC) 5 MG tablet Take 5 mg by mouth 2 (two) times daily.   01/14/2021   ASPIRIN LOW DOSE 81 MG EC tablet Take 81 mg by mouth daily.   01/14/2021   busPIRone (BUSPAR) 10 MG tablet Take 10 mg by mouth 2 (two) times daily.    01/14/2021   carvedilol (COREG) 12.5 MG tablet Take 12.5 mg by mouth 2 (two) times daily.   01/14/2021 at 1700   lisinopril (PRINIVIL,ZESTRIL) 30 MG tablet Take 30 mg by mouth daily.   01/14/2021   OZEMPIC, 2 MG/DOSE, 8 MG/3ML SOPN Inject 2 mg into the skin once a week. Mondays   Past Week   simvastatin (ZOCOR) 20 MG tablet Take 20 mg by mouth daily.   01/14/2021   XIGDUO XR 09-998 MG TB24 Take 1 tablet by mouth 2 (two) times daily.   01/14/2021   fluticasone furoate-vilanterol (BREO ELLIPTA) 100-25 MCG/INH AEPB Inhale 1 puff into the lungs daily. (Patient not taking: No sig reported) 28 each 0 Not Taking     Vitals   Vitals:   01/15/21 1730 01/15/21 1846 01/15/21 2001 01/15/21 2241  BP: (!) 153/91 (!) 156/94 (!) 139/91 118/71  Pulse: 85 73 73   Resp: (!) '21 18 20 '$ (!) 23  Temp:  98.9 F (37.2 C) 97.9 F (36.6 C)   TempSrc:  Oral Oral   SpO2: 100% 100% 90% 98%  Weight:      Height:         Body mass index is 48.75 kg/m.  Physical Exam   General: Laying comfortably in bed; in no acute distress.  HENT: Normal oropharynx and mucosa. Normal external appearance of ears and nose.  Neck: Supple, no pain or tenderness  CV: No JVD. No peripheral edema.  Pulmonary: Symmetric Chest rise. Normal respiratory effort.  Abdomen: Soft to touch, non-tender.  Ext: No cyanosis, edema, or deformity  Skin: No rash. Normal palpation of skin.   Musculoskeletal: Normal digits and nails by inspection. No clubbing.   Neurologic Examination  Mental status/Cognition: Alert, oriented to self, place, month and year, good attention.  Speech/language: Fluent, comprehension intact, object naming intact, repetition intact.  Cranial nerves:   CN II Pupils equal and reactive to light, no VF deficits    CN III,IV,VI EOM intact, no gaze preference or deviation, no nystagmus    CN V normal sensation in V1, V2, and  V3 segments bilaterally    CN VII no asymmetry, no nasolabial fold flattening    CN VIII normal  hearing to speech    CN IX & X normal palatal elevation, no uvular deviation    CN XI 5/5 head turn and 5/5 shoulder shrug bilaterally    CN XII midline tongue protrusion    Motor:  Muscle bulk: normal, tone normal, pronator drift mild RUE drift tremor none Mvmt Root Nerve  Muscle Right Left Comments  SA C5/6 Ax Deltoid 5 5   EF C5/6 Mc Biceps 5 5   EE C6/7/8 Rad Triceps 5 5   WF C6/7 Med FCR     WE C7/8 PIN ECU     F Ab C8/T1 U ADM/FDI 5 5   HF L1/2/3 Fem Illopsoas 5 5   KE L2/3/4 Fem Quad 5 5   DF L4/5 D Peron Tib Ant 5 5   PF S1/2 Tibial Grc/Sol 5 5    Reflexes:  Right Left Comments  Pectoralis      Biceps (C5/6) 2 2   Brachioradialis (C5/6) 2 2    Triceps (C6/7) 2 2    Patellar (L3/4) 2 2    Achilles (S1)      Hoffman      Plantar     Jaw jerk    Sensation:  Light touch intact   Pin prick    Temperature    Vibration   Proprioception    Coordination/Complex Motor:  - Finger to Nose intact BL - Heel to shin intact BL - Rapid alternating movement are slowed. - Gait: Deferred.  Labs   CBC:  Recent Labs  Lab 01/15/21 1027  WBC 4.3  NEUTROABS 2.7  HGB 14.3  HCT 41.9  MCV 91.9  PLT [id number]    Basic Metabolic Panel:  Lab Results  Component Value Date   NA 139 01/15/2021   K 4.2 01/15/2021   CO2 26 01/15/2021   GLUCOSE 105 (H) 01/15/2021   BUN 15 01/15/2021   CREATININE 1.09 01/15/2021   CALCIUM 9.3 01/15/2021   GFRNONAA >60 01/15/2021   GFRAA >60 06/28/2015   Lipid Panel: No results found for: LDLCALC HgbA1c: No results found for: HGBA1C Urine Drug Screen: No results found for: LABOPIA, COCAINSCRNUR, LABBENZ, AMPHETMU, THCU, LABBARB  Alcohol Level     Component Value Date/Time   ETH <10 01/15/2021 1027    CT Head without contrast: Personally reviewed and small left frontal periventricular infarct.  CT angio Head and Neck with contrast: Personally reviewed and no LVO.  MRI Brain: Unable to review but per report in care-everywhere, there is  an approximately 15 mm acute infarct in the anterior left parietal white matter. 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   Impression   ALEXANDRA MOSQUEDA is a 55 y.o. male with PMH significant for DM2, HTN who presents with R sided weakness that is resolved, found to have L frontal periventricular stroke. His neurologic examination is notable for slowed rapid movements in his RUE, no other deficit. NIHSS of 0.  Primary Diagnosis:  Other cerebral infarction due to occlusion of stenosis of small artery.  Secondary Diagnosis: Essential (primary) hypertension, Type 2 diabetes mellitus w/o complications, and Obesity  Recommendations  Plan:  Recommend that primary team order following: - Frequent Neuro checks per stroke unit protocol - Recommend obtaining TTE - Recommend obtaining Lipid panel with LDL - Please start statin if LDL > 70 - Recommend HbA1c - Antithrombotic - aspirin  $'81mg'P$  daily along with plavix '75mg'$  daily x 21 days, followed by aspirin '81mg'$  daily alone. - Recommend DVT ppx - SBP goal - permissive hypertension first 24 h < 220/110. Held home meds.  - Recommend Telemetry monitoring for arrythmia - Recommend bedside swallow screen prior to PO intake. - Stroke education booklet - Recommend PT/OT/SLP consult   Plan discussed with Dr. Velia Meyer and with patient at the bedside. ______________________________________________________________________   Thank you for the opportunity to take part in the care of this patient. If you have any further questions, please contact the neurology consultation attending.  Signed,  [place] Pager Number [phone] _ _ _   _ __   _ __ _ _  __ __   _ __   __ _

## 2021-01-16 ENCOUNTER — Other Ambulatory Visit (HOSPITAL_COMMUNITY): Payer: 59

## 2021-01-16 ENCOUNTER — Observation Stay (HOSPITAL_BASED_OUTPATIENT_CLINIC_OR_DEPARTMENT_OTHER): Payer: 59

## 2021-01-16 DIAGNOSIS — I10 Essential (primary) hypertension: Secondary | ICD-10-CM | POA: Diagnosis not present

## 2021-01-16 DIAGNOSIS — I6389 Other cerebral infarction: Secondary | ICD-10-CM

## 2021-01-16 DIAGNOSIS — E785 Hyperlipidemia, unspecified: Secondary | ICD-10-CM | POA: Diagnosis not present

## 2021-01-16 DIAGNOSIS — I639 Cerebral infarction, unspecified: Secondary | ICD-10-CM | POA: Diagnosis not present

## 2021-01-16 DIAGNOSIS — F411 Generalized anxiety disorder: Secondary | ICD-10-CM | POA: Diagnosis present

## 2021-01-16 LAB — COMPREHENSIVE METABOLIC PANEL
ALT: 24 U/L (ref 0–44)
AST: 20 U/L (ref 15–41)
Albumin: 3.5 g/dL (ref 3.5–5.0)
Alkaline Phosphatase: 62 U/L (ref 38–126)
Anion gap: 10 (ref 5–15)
BUN: 12 mg/dL (ref 6–20)
CO2: 25 mmol/L (ref 22–32)
Calcium: 9.3 mg/dL (ref 8.9–10.3)
Chloride: 102 mmol/L (ref 98–111)
Creatinine, Ser: 0.72 mg/dL (ref 0.61–1.24)
GFR, Estimated: >60 mL/min (ref >60)
Glucose, Bld: 100 mg/dL — ABNORMAL HIGH (ref 70–99)
Potassium: 3.9 mmol/L (ref 3.5–5.1)
Sodium: 137 mmol/L (ref 135–145)
Total Bilirubin: 0.7 mg/dL (ref 0.3–1.2)
Total Protein: 7.2 g/dL (ref 6.5–8.1)

## 2021-01-16 LAB — GLUCOSE, CAPILLARY
Glucose-Capillary: 113 mg/dL — ABNORMAL HIGH (ref 70–99)
Glucose-Capillary: 102 mg/dL — ABNORMAL HIGH (ref 70–99)
Glucose-Capillary: 155 mg/dL — ABNORMAL HIGH (ref 70–99)
Glucose-Capillary: 124 mg/dL — ABNORMAL HIGH (ref 70–99)

## 2021-01-16 LAB — CBC
HCT: 41 % (ref 39.0–52.0)
Hemoglobin: 14.2 g/dL (ref 13.0–17.0)
MCH: 31.6 pg (ref 26.0–34.0)
MCHC: 34.6 g/dL (ref 30.0–36.0)
MCV: 91.1 fL (ref 80.0–100.0)
Platelets: 210 10*3/uL (ref 150–400)
RBC: 4.5 MIL/uL (ref 4.22–5.81)
RDW: 12.4 % (ref 11.5–15.5)
WBC: 4.6 10*3/uL (ref 4.0–10.5)
nRBC: 0 % (ref 0.0–0.2)

## 2021-01-16 LAB — RAPID URINE DRUG SCREEN, HOSP PERFORMED
Amphetamines: NOT DETECTED
Barbiturates: NOT DETECTED
Benzodiazepines: NOT DETECTED
Cocaine: NOT DETECTED
Opiates: NOT DETECTED
Tetrahydrocannabinol: NOT DETECTED

## 2021-01-16 LAB — MAGNESIUM: Magnesium: 2 mg/dL (ref 1.7–2.4)

## 2021-01-16 LAB — LIPID PANEL
Cholesterol: 132 mg/dL (ref 0–200)
HDL: 36 mg/dL — ABNORMAL LOW (ref >40)
LDL Cholesterol: 82 mg/dL (ref 0–99)
Total CHOL/HDL Ratio: 3.7 RATIO
Triglycerides: 70 mg/dL (ref <150)
VLDL: 14 mg/dL (ref 0–40)

## 2021-01-16 LAB — ECHOCARDIOGRAM COMPLETE BUBBLE STUDY
AR max vel: 2.23 cm2
AV Area VTI: 2.28 cm2
AV Area mean vel: 2.33 cm2
AV Mean grad: 5 mmHg
AV Peak grad: 10.2 mmHg
Ao pk vel: 1.6 m/s
Area-P 1/2: 3.99 cm2
MV VTI: 3.98 cm2
S' Lateral: 3.9 cm

## 2021-01-16 LAB — HEMOGLOBIN A1C
Hgb A1c MFr Bld: 6.8 % — ABNORMAL HIGH (ref 4.8–5.6)
Mean Plasma Glucose: 148.46 mg/dL

## 2021-01-16 LAB — HIV ANTIBODY (ROUTINE TESTING W REFLEX): HIV Screen 4th Generation wRfx: NONREACTIVE

## 2021-01-16 MED ORDER — CLOPIDOGREL BISULFATE 75 MG PO TABS
75.0000 mg | ORAL_TABLET | Freq: Every day | ORAL | Status: DC
  Administered 2021-01-16 – 2021-01-17 (×2): 75 mg via ORAL
  Filled 2021-01-16 (×2): qty 1

## 2021-01-16 MED ORDER — SIMVASTATIN 20 MG PO TABS
40.0000 mg | ORAL_TABLET | Freq: Every day | ORAL | Status: DC
  Filled 2021-01-16: qty 2

## 2021-01-16 MED ORDER — PRAVASTATIN SODIUM 40 MG PO TABS: 80.0000 mg | ORAL_TABLET | Freq: Every day | ORAL | Status: DC

## 2021-01-16 NOTE — Progress Notes (Signed)
PROGRESS NOTE    Jeremy Clark  [id number] DOB: 06/23/1965 DOA: 01/15/2021 PCP: [name], MD  Outpatient Specialists:   Brief Narrative:  As per H&P done by Dr. Babs Bertin: "Jeremy Clark is a 55 y.o. male with medical history significant for hypertension, type 2 diabetes mellitus, hyperlipidemia who is admitted to [facility] on 01/15/2021 by way of transfer from [place] ED for further evaluation and management of acute ischemic CVA after presenting from home to the latter facility complaining of right-sided weakness.    The patient reports that he awoke on the morning of Wednesday, 01/13/2021 at approximately 800 a.m. with new onset weakness involving the right upper extremity and right lower extremity associated with new onset numbness involving both these extremities.  The patient reports that he was completely asymptomatic when he went to bed the evening before at approximately 9:30 PM, and is never previously experienced symptoms similar to this.  He denies any associated acute weakness involving of the left upper or left lower extremity.  No additional acute sensory change, including no additional numbness or paresthesias.  He denies any associated dizziness, vertigo, nausea, vomiting, acute change in vision, blurry vision, diplopia, word finding difficulties.  Denies any associated facial droop or slurring of speech.  He notes a transient headache 2 days ago, which is subsequently resolved without ensuing recurrence.  Denies any associated chest pain, shortness of breath, palpitations, diaphoresis, presyncope, or syncope.     No prior history of stroke.  In terms of modifiable ischemic CVA risk factors, the patient confirms underlying diagnoses of essential hypertension, type 2 diabetes mellitus, and hyperlipidemia, and notes good compliance on home simvastatin 20 mg p.o. daily.  He is on a daily baby aspirin at home, but otherwise on no blood thinners.  He  reports that he is a former smoker, having smoked half pack per day for 20 years before completely quitting smoking in 2005 without subsequent recurrence.  Denies any known history of atrial fibrillation, obstructive sleep apnea.   No recent subjective fever, chills, rigors, or generalized myalgias.  Denies any recent dysuria, gross hematuria, or change in urinary urgency/frequency.   In the setting of this new onset right hemiparesis as well as right-sided numbness, the patient underwent outpatient CT head after presenting to [facility] ER with the above complaint, with associated CT head showing subacute ischemic lesions.  Patient was discharged to home from [facility] ER, and subsequently underwent outpatient MRI of the brain on 01/14/2021 as ordered by his PCP, which showed acute left parietal infarct, prompting patient's PCP to instruct the patient to present to the local emergency department for further evaluation and management of acute ischemic CVA.  Consequently the patient presented to [place] ED earlier today for further evaluation of the above.       [facility] ED Course:  Vital signs in the ED were notable for the following: Tetramex 98.9, heart rate 73-86, blood pressure 131/74 -167/99; respiratory rate 18-22, oxygen saturation 97 to 100% on room air.   Labs were notable for the following: CMP was notable for the following: Sodium 139, creatinine 1.09, glucose 105, and liver enzymes were found to be within normal limits.  CBC notable for white blood cell count 4300, hemoglobin 14.3, platelets 227.  INR 1.0.  Screening nasopharyngeal COVID-19 PCR performed in the ED today was found to be negative.   EKG showed a sinus rhythm, heart rate 74, normal intervals, nonspecific T wave  inversion in lead III, and no evidence of ST changes, including no evidence of ST elevation.    The ED [place] consulted the on-call neurologist, Dr. Quinn Axe, who recommended CTA of the head  and neck as well as admission to [facility] for further evaluation and management of acute ischemic CVA, including evaluation of potential acute ischemic CVA modifiable risk factors.  She also recommended initiation of dual antiplatelet therapy on daily baby aspirin as well as Plavix.   Ensuing CTA head and neck with contrast showed no evidence of large vessel occlusion or high-grade stenosis.    While in [facility] ED, the following were administered: Aspirin 81 mg p.o. x1, Plavix 75 mg p.o. x1, Norvasc 5 mg p.o. x1, Coreg 12.5 mg p.o. x1, lisinopril 30 mg p.o. x1, and simvastatin 20 mg p.o. x1.     Upon arrival at [facility], the patient reports that his right sided hemiparesis has completely resolved.  Additionally, he notes significant improvement in his initial numbness involving the right upper and lower extremities, noting subtle residual paresthesias in the distal aspect of his right hand, but otherwise no residual sensory deficit".   01/16/2021: Patient seen.  Also discussed with the patient's nurse extensively.  Patient continues to improve.  Right-sided weakness and paresthesias are improving.  Patient is awaiting echocardiogram.  Patient is currently on aspirin and Plavix.  Neurology input is appreciated.  Assessment & Plan:   Principal Problem:   Acute ischemic stroke ([place]) Active Problems:   DM2 (diabetes mellitus, type 2) ([place])   Essential hypertension   Hyperlipidemia   GAD (generalized anxiety disorder)    #) Acute ischemic CVA:  -Neurology team is directing care.   -Follow echocardiogram.  d 01/16/2021: Neurology team is directing care.  Symptoms are improving.  The plan is to continue aspirin and Plavix for 3 weeks and then continue only on aspirin.     #) Essential hypertension:  -Permissive hypertension.     #) Type 2 diabetes mellitus:  -Patient was on dapagliflozin and metformin prior to admission. -Continue to optimize. -HbA1c was 6.8%.     #) Hyperlipidemia: On  simvastatin as an outpatient.     DVT prophylaxis: SCD Code Status: Full code Family Communication:  Disposition Plan: This will depend on hospital course   Consultants:  Neurology  Procedures:  None  Antimicrobials:  None    Subjective: Right sided weakness and paresthesias are improving.  Objective: Vitals:   01/16/21 0500 01/16/21 0603 01/16/21 0831 01/16/21 1026  BP:  123/65 133/89 125/73  Pulse:   76   Resp:  13 19 (!) 23  Temp:  97.7 F (36.5 C) 97.7 F (36.5 C)   TempSrc:  Oral Oral   SpO2:  100% 98% 100%  Weight: (!) 136.7 kg     Height:        Intake/Output Summary (Last 24 hours) at 01/16/2021 1053 Last data filed at 01/16/2021 0831 Gross per 24 hour  Intake 420 ml  Output --  Net 420 ml   Filed Weights   01/15/21 0928 01/16/21 0500  Weight: 128.8 kg (!) 136.7 kg    Examination:  General exam: Appears calm and comfortable.  Patient is morbidly obese. Respiratory system: Clear to auscultation. Respiratory effort normal. Cardiovascular system: S1 & S2 heard, Gastrointestinal system: Abdomen is morbidly obese.  Organs are difficult to assess.   Central nervous system: Alert and oriented.  Mild weakness right upper extremity and right lower extremity.  Extremities: No leg edema  Data Reviewed: I have personally reviewed following labs and imaging studies  CBC: Recent Labs  Lab 01/15/21 1027 01/16/21 0152  WBC 4.3 4.6  NEUTROABS 2.7  --   HGB 14.3 14.2  HCT 41.9 41.0  MCV 91.9 91.1  PLT 227 [id number]   Basic Metabolic Panel: Recent Labs  Lab 01/15/21 1027 01/16/21 0152  NA 139 137  K 4.2 3.9  CL 104 102  CO2 26 25  GLUCOSE 105* 100*  BUN 15 12  CREATININE 1.09 0.72  CALCIUM 9.3 9.3  MG  --  2.0   GFR: Estimated Creatinine Clearance: 134.7 mL/min (by C-G formula based on SCr of 0.72 mg/dL). Liver Function Tests: Recent Labs  Lab 01/15/21 1027 01/16/21 0152  AST 19 20  ALT 20 24  ALKPHOS 67 62  BILITOT 0.4 0.7  PROT 7.9 7.2   ALBUMIN 4.0 3.5   No results for input(s): LIPASE, AMYLASE in the last 168 hours. No results for input(s): AMMONIA in the last 168 hours. Coagulation Profile: Recent Labs  Lab 01/15/21 1115  INR 1.0   Cardiac Enzymes: No results for input(s): CKTOTAL, CKMB, CKMBINDEX, TROPONINI in the last 168 hours. BNP (last 3 results) No results for input(s): PROBNP in the last 8760 hours. HbA1C: Recent Labs    01/16/21 0152  HGBA1C 6.8*   CBG: Recent Labs  Lab 01/16/21 0619  GLUCAP 113*   Lipid Profile: Recent Labs    01/16/21 0152  CHOL 132  HDL 36*  LDLCALC 82  TRIG 70  CHOLHDL 3.7   Thyroid Function Tests: No results for input(s): TSH, T4TOTAL, FREET4, T3FREE, THYROIDAB in the last 72 hours. Anemia Panel: No results for input(s): VITAMINB12, FOLATE, FERRITIN, TIBC, IRON, RETICCTPCT in the last 72 hours. Urine analysis:    Component Value Date/Time   COLORURINE YELLOW 11/21/2019 0544   APPEARANCEUR CLEAR 11/21/2019 0544   LABSPEC 1.027 11/21/2019 0544   PHURINE 6.0 11/21/2019 0544   GLUCOSEU >=500 (A) 11/21/2019 0544   HGBUR NEGATIVE 11/21/2019 0544   BILIRUBINUR NEGATIVE 11/21/2019 0544   KETONESUR NEGATIVE 11/21/2019 0544   PROTEINUR NEGATIVE 11/21/2019 0544   UROBILINOGEN 1.0 12/07/2007 1440   NITRITE NEGATIVE 11/21/2019 0544   LEUKOCYTESUR NEGATIVE 11/21/2019 0544   Sepsis Labs: '@LABRCNTIP'$ (procalcitonin:4,lacticidven:4)  ) Recent Results (from the past 240 hour(s))  Resp Panel by RT-PCR (Flu A&B, Covid) Nasopharyngeal Swab     Status: None   Collection Time: 01/15/21 10:16 AM   Specimen: Nasopharyngeal Swab; Nasopharyngeal(NP) swabs in vial transport medium  Result Value Ref Range Status   SARS Coronavirus 2 by RT PCR NEGATIVE NEGATIVE Final    Comment: (NOTE) SARS-CoV-2 target nucleic acids are NOT DETECTED.  The SARS-CoV-2 RNA is generally detectable in upper respiratory specimens during the acute phase of infection. The lowest concentration of  SARS-CoV-2 viral copies this assay can detect is 138 copies/mL. A negative result does not preclude SARS-Cov-2 infection and should not be used as the sole basis for treatment or other patient management decisions. A negative result may occur with  improper specimen collection/handling, submission of specimen other than nasopharyngeal swab, presence of viral mutation(s) within the areas targeted by this assay, and inadequate number of viral copies(<138 copies/mL). A negative result must be combined with clinical observations, patient history, and epidemiological information. The expected result is Negative.  Fact Sheet for Patients:  EntrepreneurPulse.com.au  Fact Sheet for Healthcare Providers:  IncredibleEmployment.be  This test is no t yet approved or cleared by  the [employer] and  has been authorized for detection and/or diagnosis of SARS-CoV-2 by FDA under an Emergency Use Authorization (EUA). This EUA will remain  in effect (meaning this test can be used) for the duration of the COVID-19 declaration under Section 564(b)(1) of the Act, 21 U.S.C.section 360bbb-3(b)(1), unless the authorization is terminated  or revoked sooner.       Influenza A by PCR NEGATIVE NEGATIVE Final   Influenza B by PCR NEGATIVE NEGATIVE Final    Comment: (NOTE) The Xpert Xpress SARS-CoV-2/FLU/RSV plus assay is intended as an aid in the diagnosis of influenza from Nasopharyngeal swab specimens and should not be used as a sole basis for treatment. Nasal washings and aspirates are unacceptable for Xpert Xpress SARS-CoV-2/FLU/RSV testing.  Fact Sheet for Patients: EntrepreneurPulse.com.au  Fact Sheet for Healthcare Providers: IncredibleEmployment.be  This test is not yet approved or cleared by the [place] FDA and has been authorized for detection and/or diagnosis of SARS-CoV-2 by FDA under an Emergency Use  Authorization (EUA). This EUA will remain in effect (meaning this test can be used) for the duration of the COVID-19 declaration under Section 564(b)(1) of the Act, 21 U.S.C. section 360bbb-3(b)(1), unless the authorization is terminated or revoked.  Performed at [facility], [address] [place], [place] [address]          Radiology Studies: CT ANGIO HEAD NECK W WO CM  Result Date: 01/15/2021 CLINICAL DATA:  Neuro deficit, acute, stroke suspected. Additional history provided: Patient reports stroke on Wednesday with MRI yesterday, right arm numbness and limited range of motion since Wednesday. EXAM: CT ANGIOGRAPHY HEAD AND NECK TECHNIQUE: Multidetector CT imaging of the head and neck was performed using the standard protocol during bolus administration of intravenous contrast. Multiplanar CT image reconstructions and MIPs were obtained to evaluate the vascular anatomy. Carotid stenosis measurements (when applicable) are obtained utilizing NASCET criteria, using the distal internal carotid diameter as the denominator. CONTRAST:  128m OMNIPAQUE IOHEXOL 350 MG/ML SOLN COMPARISON:  Head CT 08/10/2011. FINDINGS: CT HEAD FINDINGS Brain: Cerebral volume is normal for age. There are small foci of hypodensity within the left frontal lobe periventricular white matter (series 7, image 18) and left temporoparietal periventricular white matter (series 7, image 16) likely reflecting acute/early subacute infarcts. Background mild patchy and ill-defined hypoattenuation within the cerebral white matter, nonspecific but compatible with chronic small vessel ischemic disease. There is no acute intracranial hemorrhage. No evidence of an intracranial mass. No extra-axial fluid collection. No midline shift. Vascular: No hyperdense vessel.  Atherosclerotic calcifications. Skull: Normal. Negative for fracture or focal lesion. Sinuses: No significant paranasal sinus disease. Orbits: No mass or acute  finding. Review of the MIP images confirms the above findings CTA NECK FINDINGS Aortic arch: Common origin of the innominate and left common carotid arteries. Streak artifact arising from a dense left-sided contrast bolus partially obscures the left subclavian artery. Within this limitation, there is no acute at least significant stenosis within the innominate or proximal subclavian arteries. Right carotid system: CCA and ICA patent within the neck without stenosis or significant atherosclerotic disease. Partially retropharyngeal course of the ICA. Left carotid system: CCA and ICA patent within the neck without stenosis or significant atherosclerotic disease. Partially retropharyngeal course of the ICA. Vertebral arteries: The vertebral arteries are developmentally diminutive, but patent throughout the neck. Skeleton: Cervical spondylosis. No acute bony abnormality or aggressive osseous lesion. Congenital nonunion of the posterior arch of C1. Cervicothoracic levocurvature. Other neck: No neck  mass or cervical lymphadenopathy. Thyroid unremarkable. Upper chest: No consolidation within the imaged lung apices. Review of the MIP images confirms the above findings CTA HEAD FINDINGS Anterior circulation: The intracranial internal carotid arteries are patent. Minimal non-stenotic calcified plaque within the cavernous segment on the left. The M1 middle cerebral arteries are patent. Atherosclerotic irregularity of the M2 and more distal middle cerebral artery vessels bilaterally. No high-grade proximal M2 stenosis is identified. The anterior cerebral arteries are patent. No intracranial aneurysm is identified. Posterior circulation: The intracranial vertebral arteries and basilar artery are developmentally diminutive, but patent. The posterior cerebral arteries are patent. Sizable posterior communicating arteries are present bilaterally. Venous sinuses: Within the limitations of contrast timing, no convincing thrombus.  Anatomic variants: As described Review of the MIP images confirms the above findings IMPRESSION: CT head: 1. Small foci of hypodensity within the left frontal lobe periventricular white matter and left temporoparietal periventricular white matter, likely reflecting acute/early subacute infarcts given the provided history (MCA vascular territory). 2. Background mild chronic small vessel ischemic changes within the cerebral white matter. CTA head: 1. The common and internal carotid arteries are patent within the neck without stenosis or significant atherosclerotic disease. 2. The vertebral arteries are developmentally diminutive, but patent throughout the neck. CTA neck: No intracranial large vessel occlusion or proximal high-grade arterial stenosis is identified. Intracranial atherosclerotic disease, as described. Electronically Signed   By: Kellie Simmering D.O.   On: 01/15/2021 12:55        Scheduled Meds:  amLODipine  5 mg Oral BID   aspirin EC  81 mg Oral Daily   carvedilol  12.5 mg Oral BID   clopidogrel  75 mg Oral Daily   insulin aspart  0-9 Units Subcutaneous TID WC   simvastatin  40 mg Oral Daily   Continuous Infusions:   LOS: 0 days    Time spent: 25 Minutes.    Dana Allan, MD  [facility] Pager #: [phone] 7PM-7AM contact night coverage as above

## 2021-01-16 NOTE — Evaluation (Signed)
Occupational Therapy Evaluation Patient Details Name: Jeremy Clark MRN: [id number] DOB: Jan 19, 1966 Today's Date: 01/16/2021    History of Present Illness 55 y/o male presented to ED on 8/19 with reports of stroke on Wednesday and MRI yesterday and was told to come to ED to be evaluated. Reports R arm numbness and limited ROM since Wednesday 8/17. MRI at [facility] ED revelaed 15 mm acute infarct in anteior L parietal white matter. PMH: HTN, type 2 DM, HLD.   Clinical Impression   Patient admitted for the diagnosis above.  Patient endorses transient numbness to right fingers "comes and goes", but states all his symptoms have resolved.  He is functioning at his baseline for in room mobility and ADL completion.  No needs identified in the acute setting.  Recommend home when cleared medically.     Follow Up Recommendations  No OT follow up    Equipment Recommendations  None recommended by OT    Recommendations for Other Services       Precautions / Restrictions Precautions Precautions: None Restrictions Weight Bearing Restrictions: No      Mobility Bed Mobility Overal bed mobility: Independent               Patient Response: Cooperative  Transfers Overall transfer level: Independent                    Balance Overall balance assessment: No apparent balance deficits (not formally assessed)                                         ADL either performed or assessed with clinical judgement   ADL Overall ADL's : At baseline                                             Vision Baseline Vision/History: No visual deficits Patient Visual Report: No change from baseline       Perception  [facility]   Praxis  Intact    Pertinent Vitals/Pain Pain Assessment: No/denies pain     Hand Dominance Right   Extremity/Trunk Assessment Upper Extremity Assessment Upper Extremity Assessment: Overall WFL for tasks assessed   Lower  Extremity Assessment Lower Extremity Assessment: Defer to PT evaluation   Cervical / Trunk Assessment Cervical / Trunk Assessment: Normal   Communication Communication Communication: No difficulties   Cognition Arousal/Alertness: Awake/alert Behavior During Therapy: WFL for tasks assessed/performed Overall Cognitive Status: Within Functional Limits for tasks assessed                                      Home Living Family/patient expects to be discharged to:: Private residence Living Arrangements: Children Available Help at Discharge: Family;Available PRN/intermittently Type of Home: House Home Access: Level entry     Home Layout: One level     Bathroom Shower/Tub: Occupational psychologist: Standard Bathroom Accessibility: Yes How Accessible: Accessible via walker Home Equipment: None          Prior Functioning/Environment Level of Independence: Independent        Comments: No assist with any aspect of care or mobility        OT Problem List: Decreased strength  OT Treatment/Interventions:      OT Goals(Current goals can be found in the care plan section) Acute Rehab OT Goals Patient Stated Goal: Get back home OT Goal Formulation: With patient Time For Goal Achievement: 01/16/21 Potential to Achieve Goals: Good  OT Frequency:     Barriers to D/C:  None noted          Co-evaluation              AM-PAC OT "6 Clicks" Daily Activity     Outcome Measure Help from another person eating meals?: None Help from another person taking care of personal grooming?: None Help from another person toileting, which includes using toliet, bedpan, or urinal?: None Help from another person bathing (including washing, rinsing, drying)?: None Help from another person to put on and taking off regular upper body clothing?: None Help from another person to put on and taking off regular lower body clothing?: None 6 Click Score: 24   End  of Session Nurse Communication: Mobility status  Activity Tolerance: Patient tolerated treatment well Patient left: in bed;with call bell/phone within reach  OT Visit Diagnosis: Muscle weakness (generalized) (M62.81)                Time: [id number] OT Time Calculation (min): 17 min Charges:  OT General Charges $OT Visit: 1 Visit OT Evaluation $OT Eval Moderate Complexity: 1 Mod  01/16/2021  Rich, OTR/L  Acute Rehabilitation Services  Office:  [phone]   Metta Clines 01/16/2021, 9:20 AM

## 2021-01-16 NOTE — Evaluation (Signed)
Physical Therapy Evaluation & Discharge Patient Details Name: Jeremy Clark MRN: [id number] DOB: 10-Sep-1965 Today's Date: 01/16/2021   History of Present Illness  55 y/o male presented to ED on 8/19 with reports of stroke on Wednesday and MRI yesterday and was told to come to ED to be evaluated. Reports R arm numbness and limited ROM since Wednesday 8/17. MRI at [facility] ED revelaed 15 mm acute infarct in anteior L parietal white matter. PMH: HTN, type 2 DM, HLD.  Clinical Impression  PTA, patient lives with children and reports independence with mobility. Patient currently functioning at independent level with no balance deficits noted. Patient scored 24/24 on DGI. Educated patient on benefit of aerobic exercise to reduce stroke risk. No further skilled PT needs required acutely. No PT follow up recommended at this time.     Follow Up Recommendations No PT follow up    Equipment Recommendations  None recommended by PT    Recommendations for Other Services       Precautions / Restrictions Precautions Precautions: None Restrictions Weight Bearing Restrictions: No      Mobility  Bed Mobility Overal bed mobility: Independent                  Transfers Overall transfer level: Independent Equipment used: None                Ambulation/Gait Ambulation/Gait assistance: Independent Gait Distance (Feet): 200 Feet Assistive device: None Gait Pattern/deviations: WFL(Within Functional Limits)   Gait velocity interpretation: >4.37 ft/sec, indicative of normal walking speed    Stairs Stairs: Yes Stairs assistance: Independent Stair Management: No rails;Alternating pattern;Forwards Number of Stairs: 2    Wheelchair Mobility    Modified Rankin (Stroke Patients Only) Modified Rankin (Stroke Patients Only) Pre-Morbid Rankin Score: No symptoms Modified Rankin: No symptoms     Balance Overall balance assessment: Independent                                Standardized Balance Assessment Standardized Balance Assessment : Dynamic Gait Index   Dynamic Gait Index Level Surface: Normal Change in Gait Speed: Normal Gait with Horizontal Head Turns: Normal Gait with Vertical Head Turns: Normal Gait and Pivot Turn: Normal Step Over Obstacle: Normal Step Around Obstacles: Normal Steps: Normal Total Score: 24       Pertinent Vitals/Pain Pain Assessment: No/denies pain    Home Living Family/patient expects to be discharged to:: Private residence Living Arrangements: Children Available Help at Discharge: Family;Available PRN/intermittently Type of Home: House Home Access: Level entry     Home Layout: One level Home Equipment: None      Prior Function Level of Independence: Independent         Comments: No assist with any aspect of care or mobility     Hand Dominance   Dominant Hand: Right    Extremity/Trunk Assessment   Upper Extremity Assessment Upper Extremity Assessment: Overall WFL for tasks assessed    Lower Extremity Assessment Lower Extremity Assessment: Overall WFL for tasks assessed    Cervical / Trunk Assessment Cervical / Trunk Assessment: Normal  Communication   Communication: No difficulties  Cognition Arousal/Alertness: Awake/alert Behavior During Therapy: WFL for tasks assessed/performed Overall Cognitive Status: Within Functional Limits for tasks assessed  General Comments      Exercises     Assessment/Plan    PT Assessment Patent does not need any further PT services  PT Problem List         PT Treatment Interventions      PT Goals (Current goals can be found in the Care Plan section)  Acute Rehab PT Goals Patient Stated Goal: Get back home PT Goal Formulation: All assessment and education complete, DC therapy    Frequency     Barriers to discharge        Co-evaluation               AM-PAC PT "6  Clicks" Mobility  Outcome Measure Help needed turning from your back to your side while in a flat bed without using bedrails?: None Help needed moving from lying on your back to sitting on the side of a flat bed without using bedrails?: None Help needed moving to and from a bed to a chair (including a wheelchair)?: None Help needed standing up from a chair using your arms (e.g., wheelchair or bedside chair)?: None Help needed to walk in hospital room?: None Help needed climbing 3-5 steps with a railing? : None 6 Click Score: 24    End of Session   Activity Tolerance: Patient tolerated treatment well Patient left: in chair;with call bell/phone within reach Nurse Communication: Mobility status PT Visit Diagnosis: Muscle weakness (generalized) (M62.81)    Time: [id number] PT Time Calculation (min) (ACUTE ONLY): 10 min   Charges:   PT Evaluation $PT Eval Low Complexity: 1 Low          Candis Kabel A. [name] PT, DPT Acute Rehabilitation Services Pager [phone] Office [phone]   Linna Hoff 01/16/2021, 1:01 PM

## 2021-01-16 NOTE — Progress Notes (Signed)
Pt has order for SCDs. Pt has to urinate multiple times and requested that SCDs be left off until settled in bed after breakfast. Education provided on SCDs.

## 2021-01-16 NOTE — Plan of Care (Signed)
Patient is alert oriented x 4. Pt is ambulatory. Pt is on bedside monitor, VS q2, NIH q2. Pt has tingling to right fingers and decreased sensation to right leg. Pt denies pain at this time.    Problem: Education: Goal: Knowledge of General Education information will improve Description: Including pain rating scale, medication(s)/side effects and non-pharmacologic comfort measures Outcome: Progressing   Problem: Health Behavior/Discharge Planning: Goal: Ability to manage health-related needs will improve Outcome: Progressing   Problem: Clinical Measurements: Goal: Ability to maintain clinical measurements within normal limits will improve Outcome: Progressing Goal: Will remain free from infection Outcome: Progressing Goal: Diagnostic test results will improve Outcome: Progressing Goal: Respiratory complications will improve Outcome: Progressing Goal: Cardiovascular complication will be avoided Outcome: Progressing   Problem: Activity: Goal: Risk for activity intolerance will decrease Outcome: Progressing   Problem: Nutrition: Goal: Adequate nutrition will be maintained Outcome: Progressing   Problem: Coping: Goal: Level of anxiety will decrease Outcome: Progressing   Problem: Elimination: Goal: Will not experience complications related to bowel motility Outcome: Progressing Goal: Will not experience complications related to urinary retention Outcome: Progressing   Problem: Pain Managment: Goal: General experience of comfort will improve Outcome: Progressing   Problem: Safety: Goal: Ability to remain free from injury will improve Outcome: Progressing   Problem: Skin Integrity: Goal: Risk for impaired skin integrity will decrease Outcome: Progressing   Problem: Education: Goal: Knowledge of disease or condition will improve Outcome: Progressing Goal: Knowledge of secondary prevention will improve Outcome: Progressing Goal: Knowledge of patient specific risk  factors addressed and post discharge goals established will improve Outcome: Progressing Goal: Individualized Educational Video(s) Outcome: Progressing   Problem: Coping: Goal: Will verbalize positive feelings about self Outcome: Progressing Goal: Will identify appropriate support needs Outcome: Progressing   Problem: Health Behavior/Discharge Planning: Goal: Ability to manage health-related needs will improve Outcome: Progressing   Problem: Self-Care: Goal: Ability to participate in self-care as condition permits will improve Outcome: Progressing Goal: Verbalization of feelings and concerns over difficulty with self-care will improve Outcome: Progressing Goal: Ability to communicate needs accurately will improve Outcome: Progressing   Problem: Nutrition: Goal: Risk of aspiration will decrease Outcome: Progressing Goal: Dietary intake will improve Outcome: Progressing   Problem: Intracerebral Hemorrhage Tissue Perfusion: Goal: Complications of Intracerebral Hemorrhage will be minimized Outcome: Progressing   Problem: Intracerebral Hemorrhage Tissue Perfusion: Goal: Complications of Intracerebral Hemorrhage will be minimized Outcome: Progressing   Problem: Ischemic Stroke/TIA Tissue Perfusion: Goal: Complications of ischemic stroke/TIA will be minimized Outcome: Progressing

## 2021-01-16 NOTE — Progress Notes (Addendum)
STROKE TEAM PROGRESS NOTE   ATTENDING NOTE: I reviewed above note and agree with the assessment and plan. Pt was seen and examined.   55 year old male with history of diabetes, hypertension, hyperlipidemia, morbid obesity, prostate cancer admitted for right-sided weakness and the following.  MRI showed left CR infarct.  CTA head and neck unremarkable.  A1c 6.8, LDL 82, EF 55 to 60%.  UDS negative.  On exam, patient awake alert, neurologically intact except right hand mildly decreased dexterity.  Etiology for patient's stroke likely small vessel disease in the setting of uncontrolled risk factors.  Recommend aspirin 81 and Plavix 75 DAPT for 3 weeks and then Plavix alone.  Increase Zocor 20 to 40.  Stroke risk factor modification.  For detailed assessment and plan, please refer to above as I have made changes wherever appropriate.   Neurology will sign off. Please call with questions. Pt will follow up with stroke clinic NP at [facility] in about 4 weeks. Thanks for the consult.  Rosalin Hawking, MD PhD Stroke Neurology 01/16/2021 8:23 PM    Interval History   No acute events overnight, he is resting in the chair with no family at bedside.   Echo is pending and the final part of his stroke work up. He was taking Aspirin at home prior to admission. Informed about plan of DAPT for 21 days followed by Plavix monotherapy.    Pertinent Lab Work and Imaging    01/13/21 CT Head without contrast: Small left frontal periventricular infarct.   CT angio Head and Neck with contrast: No LVO    01/14/21 MRI Brain: Unable to review but per report in care-everywhere, there is an approximately 15 mm acute infarct in the anterior left parietal white matter. 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   01/16/21 Echocardiogram Complete  Ordered, needs to be done   Physical Examination   Constitutional: Calm, appropriate for condition  Cardiovascular: Normal RR Respiratory: No increased WOB   Mental  status: AAOx4, following commands  Speech: Fluent, repetition and naming intact  Cranial nerves: EOMI, VFF, Subtle right facial droop, Tongue midline  Motor: Normal bulk and tone. RUE pronator drift. LUE + LLE 5/5 strength. RUE +4/5 ( limited due to right shoulder pain), RLE 5/5 strength  Sensory: Sensation intact to light touch throughout  Coordination: Intact FNF  Gait: Seen ambulating with PT, steady gait  NIHSS: 0   Assessment and Plan   Jeremy Clark is a 55 y.o. male w/pmh of  DM2, HTN who presents from OSH with R sided weakness that is resolved, found to have L frontal periventricular stroke. 40 of work up ( [employer], MRI Brain, CTA Head and Neck) done at OSH. At the OSH he did not receive IVTPA or thrombectomy due to lack of LVO.   #Left Parietal Lobe Stroke  Patient presented with the symptoms described above. At this time, stroke work up is complete aside from echocardiogram. MRI Brain showed a left parietal white matter stroke. CTA Head and Neck showed no LVO. Stroke labs were completed including Lipid panel w/LDL 82 and Hemoglobin A1C 6.8. Echo still needs to be done. Believe that is stroke is in the setting of small vessel disease.  - DAPT for 21 days followed by Plavix monotherapy ( was taking Aspirin daily at home when stroke occurred)  - Continue home Simvastatin 40 mg given LDL is close to goal < 70 at 82  - PT/OT/ST evaluations  - At discharge please place  ambulatory referral to neurology for stroke follow up   #Hypertension He has a history of HTN and takes Amlodipine, Coreg, Lisinopril at home. Pressures are currently trending normotensive. No need for permissive hypertension at this time given stroke sx started Wednesday, he is outside of the permissive hypertensive window.  - Primary team to resume home bp regimen at their discretion being careful to avoid acute blood pressure drops   #Stroke Dysphagia Screening Passed bedside swallow evaluation for a regular  diet   #Hyperlipidemia From a stroke prevention stand point, the LDL goal is < 70. LDL is 82, close to goal, are continuing home    #DMII  He has a history of diabetes. Hemoglobin A1C this admission noted to be 6.8, at goal from a stroke reduction standpoint given it is < 7.  - Manage diabetes with SSI this admission   Neurology will sign off when stroke work up is complete. From a neuro perspective he is ok to discharge after echo is done- PT/OT have recommended no follow up.   Hospital day # 0  Ruta Hinds, NP  [facility] Nurse Practitioner Patient seen and discussed with attending physician Dr. Erlinda Hong    To contact Stroke Continuity provider, please refer to [email or web address] After hours, contact General Neurology

## 2021-01-17 DIAGNOSIS — I639 Cerebral infarction, unspecified: Secondary | ICD-10-CM | POA: Diagnosis not present

## 2021-01-17 LAB — GLUCOSE, CAPILLARY
Glucose-Capillary: 126 mg/dL — ABNORMAL HIGH (ref 70–99)
Glucose-Capillary: 140 mg/dL — ABNORMAL HIGH (ref 70–99)

## 2021-01-17 MED ORDER — CLOPIDOGREL BISULFATE 75 MG PO TABS: 75.0000 mg | ORAL_TABLET | Freq: Every day | ORAL | 1 refills | Status: DC

## 2021-01-17 MED ORDER — ASPIRIN 81 MG PO TBEC: 81.0000 mg | DELAYED_RELEASE_TABLET | Freq: Every day | ORAL | 0 refills | Status: AC

## 2021-01-17 MED ORDER — PRAVASTATIN SODIUM 80 MG PO TABS
80.0000 mg | ORAL_TABLET | Freq: Every day | ORAL | 1 refills | Status: DC
Start: 2021-01-17 — End: 2021-08-12

## 2021-01-17 NOTE — Discharge Summary (Signed)
Physician Discharge Summary  Patient ID: Jeremy Clark MRN: [id number] DOB/AGE: Mar 16, 1966 55 y.o.  Admit date: 01/15/2021 Discharge date: 01/17/2021  Admission Diagnoses:  Discharge Diagnoses:  Principal Problem:   Acute ischemic stroke [facility]) Active Problems:   DM2 (diabetes mellitus, type 2) ([place])   Essential hypertension   Hyperlipidemia   GAD (generalized anxiety disorder)   Discharged Condition: stable  Hospital Course:  Patient is a 55 year old African-American male, morbidly obese, with past medical history significant for hypertension, type 2 diabetes mellitus and hyperlipidemia.  Patient was admitted for assessment and management of acute ischemic CVA.  Patient presented with paresthesias and right-sided weakness.  Neurology team directed patient's care.  Patient has been managed with aspirin and Plavix and, will continue blood medication for 3 weeks and then only Plavix afterwards.  Patient's symptoms have resolved.  Patient be discharged back to the care of primary care provider and neurology team.  Acute ischemic CVA:  -Neurology team directed patient's care. -Aspirin and Plavix for 3 weeks, then stop aspirin and continue only Plavix. -Symptoms have resolved. -Follow-up with primary care provider and neurology team on discharge. -Imaging studies as documented below. -Echocardiogram is as documented below.  I   Essential hypertension:  -Permissive hypertension.   -Blood pressures controlled. -PCP will continue to monitor and manage blood pressure.   Type 2 diabetes mellitus:  -Patient was on dapagliflozin and metformin prior to admission. -Patient will be discharged on above medication.  Patient is also on semaglutide 2 Mg weekly.   -HbA1c was 6.8%.     Hyperlipidemia:  -Patient be discharged back home on Pravachol.    Consults: Neurology  Significant Diagnostic Studies:  CT head: 1. Small foci of hypodensity within the left frontal lobe periventricular  white matter and left temporoparietal periventricular white matter, likely reflecting acute/early subacute infarcts given the provided history (MCA vascular territory). 2. Background mild chronic small vessel ischemic changes within the cerebral white matter.   CTA head: 1. The common and internal carotid arteries are patent within the neck without stenosis or significant atherosclerotic disease. 2. The vertebral arteries are developmentally diminutive, but patent throughout the neck.   CTA neck: No intracranial large vessel occlusion or proximal high-grade arterial stenosis is identified. Intracranial atherosclerotic disease, as described.   Echo:  1. Left ventricular ejection fraction, by estimation, is 55 to 60%. The  left ventricle has normal function. The left ventricle has no regional  wall motion abnormalities. There is mild left ventricular hypertrophy.  Left ventricular diastolic parameters  were normal.   2. Right ventricular systolic function is normal. The right ventricular  size is normal. Tricuspid regurgitation signal is inadequate for assessing  PA pressure.   3. The mitral valve is normal in structure. Trivial mitral valve  regurgitation. No evidence of mitral stenosis.   4. The aortic valve is tricuspid. Aortic valve regurgitation is not  visualized. No aortic stenosis is present.    Discharge Exam: Blood pressure 120/75, pulse 83, temperature 98 F (36.7 C), temperature source Oral, resp. rate 19, height '5\' 4"'$  (1.626 m), weight 134.2 kg, SpO2 99 %.   Disposition: Discharge disposition: 01-Home or Self Care.   Discharge Instructions     Ambulatory referral to Neurology   Complete by: As directed    Follow up with stroke clinic NP (Jessica Vanschaick or Cecille Rubin, if both not available, consider Zachery Dauer, or Ahern) at [facility] in about 4 weeks. Thanks.   Diet - low sodium heart healthy  Complete by: As directed    Increase activity slowly    Complete by: As directed       Allergies as of 01/17/2021       Reactions   Doxycycline Rash        Medication List     STOP taking these medications    Breo Ellipta 100-25 MCG/INH Aepb Generic drug: fluticasone furoate-vilanterol   busPIRone 10 MG tablet Commonly known as: BUSPAR   simvastatin 20 MG tablet Commonly known as: ZOCOR       TAKE these medications    amLODipine 5 MG tablet Commonly known as: NORVASC Take 5 mg by mouth 2 (two) times daily.   aspirin 81 MG EC tablet Take 1 tablet (81 mg total) by mouth daily for 20 days. Swallow whole. Start taking on: January 18, 2021 What changed: additional instructions   carvedilol 12.5 MG tablet Commonly known as: COREG Take 12.5 mg by mouth 2 (two) times daily.   clopidogrel 75 MG tablet Commonly known as: PLAVIX Take 1 tablet (75 mg total) by mouth daily. Start taking on: January 18, 2021   lisinopril 30 MG tablet Commonly known as: ZESTRIL Take 30 mg by mouth daily.   Ozempic (2 MG/DOSE) 8 MG/3ML Sopn Generic drug: Semaglutide (2 MG/DOSE) Inject 2 mg into the skin once a week. Mondays   pravastatin 80 MG tablet Commonly known as: PRAVACHOL Take 1 tablet (80 mg total) by mouth daily at 6 PM.   Xigduo XR 09-998 MG Tb24 Generic drug: Dapagliflozin-metFORMIN HCl ER Take 1 tablet by mouth 2 (two) times daily.        Follow-up Information     [facility]. Schedule an appointment as soon as possible for a visit in 1 month(s).   Specialty: Neurology Why: stroke clinic Contact information: [address] [place] [phone]                Signed: Bonnell Public 01/17/2021, 2:16 PM

## 2021-01-17 NOTE — Plan of Care (Signed)
  Problem: Education: Goal: Knowledge of General Education information will improve Description: Including pain rating scale, medication(s)/side effects and non-pharmacologic comfort measures Outcome: Adequate for Discharge   Problem: Health Behavior/Discharge Planning: Goal: Ability to manage health-related needs will improve Outcome: Adequate for Discharge   Problem: Clinical Measurements: Goal: Ability to maintain clinical measurements within normal limits will improve Outcome: Adequate for Discharge Goal: Will remain free from infection Outcome: Adequate for Discharge Goal: Diagnostic test results will improve Outcome: Adequate for Discharge Goal: Respiratory complications will improve Outcome: Adequate for Discharge Goal: Cardiovascular complication will be avoided Outcome: Adequate for Discharge   Problem: Activity: Goal: Risk for activity intolerance will decrease Outcome: Adequate for Discharge   Problem: Nutrition: Goal: Adequate nutrition will be maintained Outcome: Adequate for Discharge   Problem: Coping: Goal: Level of anxiety will decrease Outcome: Adequate for Discharge   Problem: Elimination: Goal: Will not experience complications related to bowel motility Outcome: Adequate for Discharge Goal: Will not experience complications related to urinary retention Outcome: Adequate for Discharge   Problem: Pain Managment: Goal: General experience of comfort will improve Outcome: Adequate for Discharge   Problem: Safety: Goal: Ability to remain free from injury will improve Outcome: Adequate for Discharge   Problem: Skin Integrity: Goal: Risk for impaired skin integrity will decrease Outcome: Adequate for Discharge   Problem: Education: Goal: Knowledge of disease or condition will improve Outcome: Adequate for Discharge Goal: Knowledge of secondary prevention will improve Outcome: Adequate for Discharge Goal: Knowledge of patient specific risk factors  addressed and post discharge goals established will improve Outcome: Adequate for Discharge Goal: Individualized Educational Video(s) Outcome: Adequate for Discharge   Problem: Coping: Goal: Will verbalize positive feelings about self Outcome: Adequate for Discharge Goal: Will identify appropriate support needs Outcome: Adequate for Discharge   Problem: Health Behavior/Discharge Planning: Goal: Ability to manage health-related needs will improve Outcome: Adequate for Discharge   Problem: Self-Care: Goal: Ability to participate in self-care as condition permits will improve Outcome: Adequate for Discharge Goal: Verbalization of feelings and concerns over difficulty with self-care will improve Outcome: Adequate for Discharge Goal: Ability to communicate needs accurately will improve Outcome: Adequate for Discharge   Problem: Nutrition: Goal: Risk of aspiration will decrease Outcome: Adequate for Discharge Goal: Dietary intake will improve Outcome: Adequate for Discharge   Problem: Intracerebral Hemorrhage Tissue Perfusion: Goal: Complications of Intracerebral Hemorrhage will be minimized Outcome: Adequate for Discharge   Problem: Ischemic Stroke/TIA Tissue Perfusion: Goal: Complications of ischemic stroke/TIA will be minimized Outcome: Adequate for Discharge   Problem: Spontaneous Subarachnoid Hemorrhage Tissue Perfusion: Goal: Complications of Spontaneous Subarachnoid Hemorrhage will be minimized Outcome: Adequate for Discharge   

## 2021-01-17 NOTE — Plan of Care (Signed)
Pt is alert oriented x 4. Ambulatory. Pt denies pain. NIH stroke scale completed. Score was 0 at midnight NIH scale. Pt resting.   Problem: Health Behavior/Discharge Planning: Goal: Ability to manage health-related needs will improve Outcome: Progressing   Problem: Clinical Measurements: Goal: Ability to maintain clinical measurements within normal limits will improve Outcome: Progressing Goal: Will remain free from infection Outcome: Progressing Goal: Diagnostic test results will improve Outcome: Progressing Goal: Respiratory complications will improve Outcome: Progressing Goal: Cardiovascular complication will be avoided Outcome: Progressing   Problem: Activity: Goal: Risk for activity intolerance will decrease Outcome: Progressing   Problem: Nutrition: Goal: Adequate nutrition will be maintained Outcome: Progressing   Problem: Coping: Goal: Level of anxiety will decrease Outcome: Progressing   Problem: Elimination: Goal: Will not experience complications related to bowel motility Outcome: Progressing Goal: Will not experience complications related to urinary retention Outcome: Progressing   Problem: Safety: Goal: Ability to remain free from injury will improve Outcome: Progressing   Problem: Skin Integrity: Goal: Risk for impaired skin integrity will decrease Outcome: Progressing   Problem: Education: Goal: Knowledge of disease or condition will improve Outcome: Progressing Goal: Knowledge of secondary prevention will improve Outcome: Progressing Goal: Knowledge of patient specific risk factors addressed and post discharge goals established will improve Outcome: Progressing Goal: Individualized Educational Video(s) Outcome: Progressing   Problem: Coping: Goal: Will verbalize positive feelings about self Outcome: Progressing Goal: Will identify appropriate support needs Outcome: Progressing   Problem: Health Behavior/Discharge Planning: Goal: Ability to  manage health-related needs will improve Outcome: Progressing   Problem: Self-Care: Goal: Ability to participate in self-care as condition permits will improve Outcome: Progressing Goal: Verbalization of feelings and concerns over difficulty with self-care will improve Outcome: Progressing Goal: Ability to communicate needs accurately will improve Outcome: Progressing   Problem: Nutrition: Goal: Risk of aspiration will decrease Outcome: Progressing Goal: Dietary intake will improve Outcome: Progressing   Problem: Intracerebral Hemorrhage Tissue Perfusion: Goal: Complications of Intracerebral Hemorrhage will be minimized Outcome: Progressing   Problem: Ischemic Stroke/TIA Tissue Perfusion: Goal: Complications of ischemic stroke/TIA will be minimized Outcome: Progressing   Problem: Spontaneous Subarachnoid Hemorrhage Tissue Perfusion: Goal: Complications of Spontaneous Subarachnoid Hemorrhage will be minimized Outcome: Progressing

## 2021-01-17 NOTE — Progress Notes (Signed)
Order to discharge pt home.  Discharge instructions/AVS given to patient - education provided as needed.  Pt advised to call PCP and/or come back to the hospital if there are any problems. Pt verbalized understanding.

## 2021-02-11 ENCOUNTER — Ambulatory Visit: Payer: 59 | Admitting: Podiatry

## 2021-02-22 ENCOUNTER — Other Ambulatory Visit: Payer: Self-pay

## 2021-02-22 ENCOUNTER — Ambulatory Visit (INDEPENDENT_AMBULATORY_CARE_PROVIDER_SITE_OTHER): Payer: 59 | Admitting: Podiatry

## 2021-02-22 DIAGNOSIS — B351 Tinea unguium: Secondary | ICD-10-CM

## 2021-02-22 DIAGNOSIS — M79675 Pain in left toe(s): Secondary | ICD-10-CM

## 2021-02-22 DIAGNOSIS — E118 Type 2 diabetes mellitus with unspecified complications: Secondary | ICD-10-CM

## 2021-02-22 DIAGNOSIS — Z794 Long term (current) use of insulin: Secondary | ICD-10-CM

## 2021-02-22 DIAGNOSIS — M79674 Pain in right toe(s): Secondary | ICD-10-CM | POA: Diagnosis not present

## 2021-02-22 NOTE — Progress Notes (Signed)
  Subjective:  Patient ID: Jeremy Clark, male    DOB: [birth year],  MRN: [id number]  Chief Complaint  Patient presents with   Nail Problem    Thick painful toenails, 3 month follow up    55 y.o. male returns with the above complaint. History confirmed with patient.  Unfortunate had a small stroke with right-sided weakness since his last visit.  He is in current therapy.  Objective:  Physical Exam: warm, good capillary refill, no trophic changes or ulcerative lesions, normal DP and PT pulses, normal sensory exam, onychomycosis x10 with subungual rate, yellow and brown elongated toenails with thinning of nail plates  Assessment:   1. Pain due to onychomycosis of toenails of both feet   2. Controlled type 2 diabetes mellitus with complication, with long-term current use of insulin ([place])       Plan:  Patient was evaluated and treated and all questions answered.  Patient educated on diabetes. Discussed proper diabetic foot care and discussed risks and complications of disease. Educated patient in depth on reasons to return to the office immediately should he/she discover anything concerning or new on the feet. All questions answered. Discussed proper shoes as well.    Discussed the etiology and treatment options for the condition in detail with the patient. Educated patient on the topical and oral treatment options for mycotic nails. Recommended debridement of the nails today. Sharp and mechanical debridement performed of all painful and mycotic nails today. Nails debrided in length and thickness using a nail nipper to level of comfort. Discussed treatment options including appropriate shoe gear. Follow up as needed for painful nails.     No follow-ups on file.

## 2021-03-07 ENCOUNTER — Other Ambulatory Visit: Payer: Self-pay | Admitting: Internal Medicine

## 2021-03-11 ENCOUNTER — Ambulatory Visit (INDEPENDENT_AMBULATORY_CARE_PROVIDER_SITE_OTHER): Payer: 59 | Admitting: Adult Health

## 2021-03-11 ENCOUNTER — Encounter: Payer: Self-pay | Admitting: Adult Health

## 2021-03-11 VITALS — BP 129/80 | HR 72 | Ht 64.0 in | Wt 310.0 lb

## 2021-03-11 DIAGNOSIS — I639 Cerebral infarction, unspecified: Secondary | ICD-10-CM

## 2021-03-11 DIAGNOSIS — Z09 Encounter for follow-up examination after completed treatment for conditions other than malignant neoplasm: Secondary | ICD-10-CM | POA: Diagnosis not present

## 2021-03-11 DIAGNOSIS — R202 Paresthesia of skin: Secondary | ICD-10-CM

## 2021-03-11 NOTE — Progress Notes (Signed)
[facility] [address] [place]. [place] [address] [phone]       HOSPITAL FOLLOW UP NOTE  Jeremy Clark Date of Birth:  04-27-1966 Medical Record Number:  [id number]   Reason for Referral:  hospital stroke follow up    SUBJECTIVE:   CHIEF COMPLAINT:  Chief Complaint  Patient presents with   Follow-up    RM 2 alone Pt is well, having R sided numbness but overall stable     HPI:   Jeremy Clark is a 55 y.o. male with history of diabetes, hypertension, hyperlipidemia, morbid obesity, prostate cancer admitted on 01/15/2021 for right-sided weakness and the following.  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  CTA head and neck unremarkable.  A1c 6.8, LDL 82, EF 55 to 60%.  UDS negative. Etiology likely small vessel disease in the setting of uncontrolled risk factors.  Recommend aspirin 81 and Plavix 75 DAPT for 3 weeks and then Plavix alone.  Increase Zocor 20 to 40 but at d/c, changed to pravastatin 80 mg daily.  Discharged home in stable condition.  Today, 03/11/2021, being seen for initial hospital follow-up unaccompanied.  Overall doing well.  Reports residual right hand numbness and intermittent numbness in leg. Leg can feel stiff after sitting for too long.  Denies any associated pain or weakness. Working with therapy [facility] - has been gradually improving.  No new stroke symptoms.  Has returned back to all prior activities without difficulty. 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  Remains on DAPT despite 3-week recommendation but denies any side effects.  Currently on pravastatin 80 mg daily without side effects. Glucose levels have been good with today's AM fasting level 106. Blood pressure today 129/80 - monitors at home and has been stable.  No further concerns at this time.      PERTINENT IMAGING  01/13/21 CT Head without contrast CT angio Head and Neck with  contrast: IMPRESSION: CT head: 1. Small foci of hypodensity within the left frontal lobe periventricular white matter and left temporoparietal periventricular white matter, likely reflecting acute/early subacute infarcts given the provided history (MCA vascular territory). 2. Background mild chronic small vessel ischemic changes within the cerebral white matter.   CTA head: 1. The common and internal carotid arteries are patent within the neck without stenosis or significant atherosclerotic disease. 2. The vertebral arteries are developmentally diminutive, but patent throughout the neck.   CTA neck: No intracranial large vessel occlusion or proximal high-grade arterial stenosis is identified. Intracranial atherosclerotic disease, as described.    01/14/21 MRI Brain: Unable to review but per report in care-everywhere, there is an approximately 15 mm acute infarct in the anterior left parietal white matter. 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    01/16/21 Echocardiogram Complete  IMPRESSIONS   1. Left ventricular ejection fraction, by estimation, is 55 to 60%. The  left ventricle has normal function. The left ventricle has no regional  wall motion abnormalities. There is mild left ventricular hypertrophy.  Left ventricular diastolic parameters  were normal.   2. Right ventricular systolic function is normal. The right ventricular  size is normal. Tricuspid regurgitation signal is inadequate for assessing  PA pressure.   3. The mitral valve is normal in structure. Trivial mitral valve  regurgitation. No evidence of mitral stenosis.   4. The aortic valve is tricuspid. Aortic valve regurgitation is not  visualized. No aortic  stenosis is present.      ROS:   14 system review of systems performed and negative with exception of those listed in HPI  PMH:  Past Medical History:  Diagnosis Date   Abdominal hernia    Diabetes mellitus    High cholesterol     Hypertension    Prostate cancer ([place])     PSH:  Past Surgical History:  Procedure Laterality Date   PROSTATE BIOPSY     SHOULDER SURGERY     right    Social History:  Social History   Socioeconomic History   Marital status: Widowed    Spouse name: Not on file   Number of children: 2   Years of education: Not on file   Highest education level: Not on file  Occupational History   Occupation: Freight forwarder    Comment: full time  Tobacco Use   Smoking status: Former    Packs/day: 0.50    Years: 20.00    Pack years: 10.00    Types: Cigarettes    Quit date: 05/31/2003    Years since quitting: 17.7   Smokeless tobacco: Never  Vaping Use   Vaping Use: Never used  Substance and Sexual Activity   Alcohol use: Yes    Comment: occ   Drug use: No   Sexual activity: Not on file  Other Topics Concern   Not on file  Social History Narrative   Not on file   Social Determinants of Health   Financial Resource Strain: Not on file  Food Insecurity: Not on file  Transportation Needs: Not on file  Physical Activity: Not on file  Stress: Not on file  Social Connections: Not on file  Intimate Partner Violence: Not on file    Family History:  Family History  Problem Relation Age of Onset   Hypertension Mother    Prostate cancer Father    Aneurysm Daughter    Colon cancer Neg Hx    Pancreatic cancer Neg Hx    Breast cancer Neg Hx     Medications:   Current Outpatient Medications on File Prior to Visit  Medication Sig Dispense Refill   amLODipine (NORVASC) 5 MG tablet Take 5 mg by mouth 2 (two) times daily.     carvedilol (COREG) 12.5 MG tablet Take 12.5 mg by mouth 2 (two) times daily.     clopidogrel (PLAVIX) 75 MG tablet Take 1 tablet (75 mg total) by mouth daily. 30 tablet 1   lisinopril (PRINIVIL,ZESTRIL) 30 MG tablet Take 30 mg by mouth daily.     OZEMPIC, 2 MG/DOSE, 8 MG/3ML SOPN Inject 2 mg into the skin once a week. Mondays     pravastatin (PRAVACHOL) 80 MG tablet Take  1 tablet (80 mg total) by mouth daily at 6 PM. 30 tablet 1   XIGDUO XR 09-998 MG TB24 Take 1 tablet by mouth 2 (two) times daily.     No current facility-administered medications on file prior to visit.    Allergies:   Allergies  Allergen Reactions   Doxycycline Rash      OBJECTIVE:  Physical Exam  Vitals:   03/11/21 0807  BP: 129/80  Pulse: 72  Weight: (!) 310 lb (140.6 kg)  Height: 5\' 4"  (1.626 m)   Body mass index is 53.21 kg/m. No results found.   Post stroke PHQ 2/9 Depression screen PHQ 2/9 03/11/2021  Decreased Interest 0  Down, Depressed, Hopeless 0  PHQ - 2 Score 0  General: Morbidly obese very pleasant middle-aged African-American male, seated, in no evident distress Head: head normocephalic and atraumatic.   Neck: supple with no carotid or supraclavicular bruits Cardiovascular: regular rate and rhythm, no murmurs Musculoskeletal: no deformity Skin:  no rash/petichiae Vascular:  Normal pulses all extremities   Neurologic Exam Mental Status: Awake and fully alert.  Fluent speech and language.  Oriented to place and time. Recent and remote memory intact. Attention span, concentration and fund of knowledge appropriate. Mood and affect appropriate.  Cranial Nerves: Fundoscopic exam reveals sharp disc margins. Pupils equal, briskly reactive to light. Extraocular movements full without nystagmus. Visual fields full to confrontation. Hearing intact. Facial sensation intact. Face, tongue, palate moves normally and symmetrically.  Motor: Normal bulk and tone. Normal strength in all tested extremity muscles Sensory.: intact to touch , pinprick , position and vibratory sensation except subjective right arm and leg numbness Coordination: Rapid alternating movements normal in all extremities. Finger-to-nose and heel-to-shin performed accurately bilaterally. Gait and Station: Arises from chair without difficulty. Stance is normal. Gait demonstrates normal stride  length and balance without use of assistive device. Tandem walk and heel toe without difficulty.  Reflexes: 1+ and symmetric. Toes downgoing.     NIHSS  0 Modified Rankin  1      ASSESSMENT: Jeremy Clark is a 55 y.o. 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 Vascular risk factors include HTN, HLD, DM.      PLAN:  Left parietal lobe stroke:  Residual deficit: Right-sided numbness -gradually improving.  Continue to work with PT. Continue clopidogrel 75 mg daily  and simvastatin for secondary stroke prevention.   Advised to discontinue aspirin as 3 weeks DAPT completed Discussed secondary stroke prevention measures and importance of close PCP follow up for aggressive stroke risk factor management. I have gone over the pathophysiology of stroke, warning signs and symptoms, risk factors and their management in some detail with instructions to go to the closest emergency room for symptoms of concern. HTN: BP goal <130/90.  Stable on current regimen per PCP HLD: LDL goal <70. Recent LDL 65 (9/28) down from 82.  Continue pravastatin 80 mg daily routinely monitored and managed by PCP DMII: A1c goal<7.0. Recent A1c 6.6 (9/28).  Routinely monitored by PCP    Follow up in 5 months or call earlier if needed   CC:  [place] provider: Dr. Leonie Man PCP: [name], MD    I spent 56 minutes of face-to-face and non-face-to-face time with patient.  This included previsit chart review including review of recent hospitalization, lab review, study review, electronic health record documentation, patient education regarding recent stroke including etiology, secondary stroke prevention measures and importance of managing stroke risk factors, residual deficits and typical recovery time and answered all other questions to patient satisfaction  Frann Rider, AGNP-BC  [facility] [address] [place], [place]  [address]  Phone [phone] Fax [phone] Note: This document was prepared with digital dictation and possible smart phrase technology. Any transcriptional errors that result from this process are unintentional.

## 2021-03-11 NOTE — Patient Instructions (Signed)
Continue working with therapies for likely ongoing recovery  Continue clopidogrel 75 mg daily  and pravastatin  for secondary stroke prevention  Please stop aspirin at this time  Continue to follow up with PCP regarding cholesterol, blood pressure and diabetes management  Maintain strict control of hypertension with blood pressure goal below 130/90, diabetes with hemoglobin A1c goal below 7.0% and cholesterol with LDL cholesterol (bad cholesterol) goal below 70 mg/dL.       Followup in the future with me in 5 months or call earlier if needed       Thank you for coming to see [place] at [facility]. I hope we have been able to provide you high quality care today.  You may receive a patient satisfaction survey over the next few weeks. We would appreciate your feedback and comments so that we may continue to improve ourselves and the health of our patients.

## 2021-03-12 ENCOUNTER — Other Ambulatory Visit: Payer: Self-pay | Admitting: Internal Medicine

## 2021-03-18 NOTE — Progress Notes (Signed)
I agree with the above plan 

## 2021-03-19 ENCOUNTER — Other Ambulatory Visit: Payer: Self-pay

## 2021-04-08 NOTE — Telephone Encounter (Signed)
Rx refills

## 2021-05-09 ENCOUNTER — Inpatient Hospital Stay (HOSPITAL_COMMUNITY)
Admission: EM | Admit: 2021-05-09 | Discharge: 2021-05-17 | DRG: 669 | Disposition: A | Discharge status: Home / Self Care | Payer: 59 | Attending: Internal Medicine | Admitting: Internal Medicine

## 2021-05-09 ENCOUNTER — Emergency Department (HOSPITAL_COMMUNITY): Payer: 59

## 2021-05-09 ENCOUNTER — Encounter (HOSPITAL_COMMUNITY): Payer: Self-pay | Admitting: Emergency Medicine

## 2021-05-09 ENCOUNTER — Other Ambulatory Visit: Payer: Self-pay

## 2021-05-09 DIAGNOSIS — E876 Hypokalemia: Secondary | ICD-10-CM | POA: Diagnosis not present

## 2021-05-09 DIAGNOSIS — K439 Ventral hernia without obstruction or gangrene: Secondary | ICD-10-CM | POA: Diagnosis present

## 2021-05-09 DIAGNOSIS — C61 Malignant neoplasm of prostate: Secondary | ICD-10-CM | POA: Diagnosis present

## 2021-05-09 DIAGNOSIS — E872 Acidosis, unspecified: Secondary | ICD-10-CM | POA: Diagnosis present

## 2021-05-09 DIAGNOSIS — Z7902 Long term (current) use of antithrombotics/antiplatelets: Secondary | ICD-10-CM | POA: Diagnosis not present

## 2021-05-09 DIAGNOSIS — Z881 Allergy status to other antibiotic agents status: Secondary | ICD-10-CM | POA: Diagnosis not present

## 2021-05-09 DIAGNOSIS — M545 Low back pain, unspecified: Secondary | ICD-10-CM | POA: Diagnosis present

## 2021-05-09 DIAGNOSIS — D649 Anemia, unspecified: Secondary | ICD-10-CM | POA: Diagnosis not present

## 2021-05-09 DIAGNOSIS — Z6841 Body Mass Index (BMI) 40.0 and over, adult: Secondary | ICD-10-CM | POA: Diagnosis not present

## 2021-05-09 DIAGNOSIS — Z8249 Family history of ischemic heart disease and other diseases of the circulatory system: Secondary | ICD-10-CM

## 2021-05-09 DIAGNOSIS — N281 Cyst of kidney, acquired: Secondary | ICD-10-CM | POA: Diagnosis present

## 2021-05-09 DIAGNOSIS — E7800 Pure hypercholesterolemia, unspecified: Secondary | ICD-10-CM | POA: Diagnosis present

## 2021-05-09 DIAGNOSIS — R310 Gross hematuria: Secondary | ICD-10-CM | POA: Diagnosis present

## 2021-05-09 DIAGNOSIS — D500 Iron deficiency anemia secondary to blood loss (chronic): Secondary | ICD-10-CM

## 2021-05-09 DIAGNOSIS — Z8546 Personal history of malignant neoplasm of prostate: Secondary | ICD-10-CM

## 2021-05-09 DIAGNOSIS — Z79899 Other long term (current) drug therapy: Secondary | ICD-10-CM

## 2021-05-09 DIAGNOSIS — Y842 Radiological procedure and radiotherapy as the cause of abnormal reaction of the patient, or of later complication, without mention of misadventure at the time of the procedure: Secondary | ICD-10-CM | POA: Diagnosis present

## 2021-05-09 DIAGNOSIS — E860 Dehydration: Secondary | ICD-10-CM | POA: Diagnosis present

## 2021-05-09 DIAGNOSIS — E119 Type 2 diabetes mellitus without complications: Secondary | ICD-10-CM

## 2021-05-09 DIAGNOSIS — Z87891 Personal history of nicotine dependence: Secondary | ICD-10-CM

## 2021-05-09 DIAGNOSIS — F411 Generalized anxiety disorder: Secondary | ICD-10-CM | POA: Diagnosis present

## 2021-05-09 DIAGNOSIS — R319 Hematuria, unspecified: Secondary | ICD-10-CM | POA: Diagnosis not present

## 2021-05-09 DIAGNOSIS — R338 Other retention of urine: Secondary | ICD-10-CM | POA: Diagnosis present

## 2021-05-09 DIAGNOSIS — D62 Acute posthemorrhagic anemia: Secondary | ICD-10-CM | POA: Diagnosis present

## 2021-05-09 DIAGNOSIS — N3289 Other specified disorders of bladder: Secondary | ICD-10-CM | POA: Diagnosis not present

## 2021-05-09 DIAGNOSIS — N3040 Irradiation cystitis without hematuria: Secondary | ICD-10-CM | POA: Diagnosis not present

## 2021-05-09 DIAGNOSIS — E785 Hyperlipidemia, unspecified: Secondary | ICD-10-CM | POA: Diagnosis present

## 2021-05-09 DIAGNOSIS — I10 Essential (primary) hypertension: Secondary | ICD-10-CM | POA: Diagnosis present

## 2021-05-09 DIAGNOSIS — Z8673 Personal history of transient ischemic attack (TIA), and cerebral infarction without residual deficits: Secondary | ICD-10-CM | POA: Diagnosis not present

## 2021-05-09 DIAGNOSIS — G8929 Other chronic pain: Secondary | ICD-10-CM | POA: Diagnosis present

## 2021-05-09 DIAGNOSIS — Z8042 Family history of malignant neoplasm of prostate: Secondary | ICD-10-CM

## 2021-05-09 DIAGNOSIS — N3041 Irradiation cystitis with hematuria: Principal | ICD-10-CM | POA: Diagnosis present

## 2021-05-09 DIAGNOSIS — R002 Palpitations: Secondary | ICD-10-CM | POA: Diagnosis present

## 2021-05-09 DIAGNOSIS — Z20822 Contact with and (suspected) exposure to covid-19: Secondary | ICD-10-CM | POA: Diagnosis present

## 2021-05-09 DIAGNOSIS — A419 Sepsis, unspecified organism: Secondary | ICD-10-CM | POA: Diagnosis not present

## 2021-05-09 LAB — CBC WITH DIFFERENTIAL/PLATELET
Abs Immature Granulocytes: 0.02 10*3/uL (ref 0.00–0.07)
Abs Immature Granulocytes: 0.04 10*3/uL (ref 0.00–0.07)
Basophils Absolute: 0 10*3/uL (ref 0.0–0.1)
Basophils Absolute: 0 10*3/uL (ref 0.0–0.1)
Basophils Relative: 0 %
Basophils Relative: 0 %
Eosinophils Absolute: 0 10*3/uL (ref 0.0–0.5)
Eosinophils Absolute: 0.2 10*3/uL (ref 0.0–0.5)
Eosinophils Relative: 0 %
Eosinophils Relative: 3 %
HCT: 24.6 % — ABNORMAL LOW (ref 39.0–52.0)
HCT: 23.4 % — ABNORMAL LOW (ref 39.0–52.0)
Hemoglobin: 7.8 g/dL — ABNORMAL LOW (ref 13.0–17.0)
Hemoglobin: 7.5 g/dL — ABNORMAL LOW (ref 13.0–17.0)
Immature Granulocytes: 0 %
Immature Granulocytes: 1 %
Lymphocytes Relative: 9 %
Lymphocytes Relative: 15 %
Lymphs Abs: 0.5 10*3/uL — ABNORMAL LOW (ref 0.7–4.0)
Lymphs Abs: 0.9 10*3/uL (ref 0.7–4.0)
MCH: 31.3 pg (ref 26.0–34.0)
MCH: 31.3 pg (ref 26.0–34.0)
MCHC: 31.7 g/dL (ref 30.0–36.0)
MCHC: 32.1 g/dL (ref 30.0–36.0)
MCV: 98.8 fL (ref 80.0–100.0)
MCV: 97.5 fL (ref 80.0–100.0)
Monocytes Absolute: 0.4 10*3/uL (ref 0.1–1.0)
Monocytes Absolute: 0.5 10*3/uL (ref 0.1–1.0)
Monocytes Relative: 7 %
Monocytes Relative: 9 %
Neutro Abs: 4.7 10*3/uL (ref 1.7–7.7)
Neutro Abs: 4.4 10*3/uL (ref 1.7–7.7)
Neutrophils Relative %: 84 %
Neutrophils Relative %: 72 %
Platelets: 307 10*3/uL (ref 150–400)
Platelets: 286 10*3/uL (ref 150–400)
RBC: 2.49 MIL/uL — ABNORMAL LOW (ref 4.22–5.81)
RBC: 2.4 MIL/uL — ABNORMAL LOW (ref 4.22–5.81)
RDW: 18.7 % — ABNORMAL HIGH (ref 11.5–15.5)
RDW: 18.6 % — ABNORMAL HIGH (ref 11.5–15.5)
WBC: 5.7 10*3/uL (ref 4.0–10.5)
WBC: 6 10*3/uL (ref 4.0–10.5)
nRBC: 0 % (ref 0.0–0.2)
nRBC: 0 % (ref 0.0–0.2)

## 2021-05-09 LAB — HEMOGLOBIN AND HEMATOCRIT, BLOOD
HCT: 24.1 % — ABNORMAL LOW (ref 39.0–52.0)
Hemoglobin: 7.5 g/dL — ABNORMAL LOW (ref 13.0–17.0)

## 2021-05-09 LAB — URINALYSIS, ROUTINE W REFLEX MICROSCOPIC
RBC / HPF: >50 RBC/hpf — ABNORMAL HIGH (ref 0–5)
WBC, UA: >50 WBC/hpf — ABNORMAL HIGH (ref 0–5)

## 2021-05-09 LAB — RESP PANEL BY RT-PCR (FLU A&B, COVID) ARPGX2
Influenza A by PCR: NEGATIVE
Influenza B by PCR: NEGATIVE
SARS Coronavirus 2 by RT PCR: NEGATIVE

## 2021-05-09 LAB — PROTIME-INR
INR: 1.1 (ref 0.8–1.2)
Prothrombin Time: 13.9 seconds (ref 11.4–15.2)

## 2021-05-09 LAB — COMPREHENSIVE METABOLIC PANEL
ALT: 20 U/L (ref 0–44)
AST: 32 U/L (ref 15–41)
Albumin: 3.5 g/dL (ref 3.5–5.0)
Alkaline Phosphatase: 63 U/L (ref 38–126)
Anion gap: 14 (ref 5–15)
BUN: 9 mg/dL (ref 6–20)
CO2: 19 mmol/L — ABNORMAL LOW (ref 22–32)
Calcium: 8.3 mg/dL — ABNORMAL LOW (ref 8.9–10.3)
Chloride: 106 mmol/L (ref 98–111)
Creatinine, Ser: 0.96 mg/dL (ref 0.61–1.24)
GFR, Estimated: >60 mL/min (ref >60)
Glucose, Bld: 167 mg/dL — ABNORMAL HIGH (ref 70–99)
Potassium: 3.8 mmol/L (ref 3.5–5.1)
Sodium: 139 mmol/L (ref 135–145)
Total Bilirubin: 0.6 mg/dL (ref 0.3–1.2)
Total Protein: 7.1 g/dL (ref 6.5–8.1)

## 2021-05-09 LAB — ABO/RH: ABO/RH(D): O POS

## 2021-05-09 LAB — LACTIC ACID, PLASMA
Lactic Acid, Venous: 4.8 mmol/L (ref 0.5–1.9)
Lactic Acid, Venous: 5.5 mmol/L (ref 0.5–1.9)
Lactic Acid, Venous: 4.2 mmol/L (ref 0.5–1.9)
Lactic Acid, Venous: 3.7 mmol/L (ref 0.5–1.9)
Lactic Acid, Venous: 3.5 mmol/L (ref 0.5–1.9)

## 2021-05-09 LAB — GLUCOSE, CAPILLARY
Glucose-Capillary: 146 mg/dL — ABNORMAL HIGH (ref 70–99)
Glucose-Capillary: 151 mg/dL — ABNORMAL HIGH (ref 70–99)

## 2021-05-09 LAB — PREPARE RBC (CROSSMATCH)

## 2021-05-09 LAB — APTT: aPTT: 25 seconds (ref 24–36)

## 2021-05-09 MED ORDER — LORATADINE 10 MG PO TABS
10.0000 mg | ORAL_TABLET | Freq: Every day | ORAL | Status: DC
  Administered 2021-05-10 – 2021-05-17 (×7): 10 mg via ORAL
  Filled 2021-05-09 (×7): qty 1

## 2021-05-09 MED ORDER — METOPROLOL TARTRATE 5 MG/5ML IV SOLN: 5.0000 mg | Freq: Four times a day (QID) | INTRAVENOUS | Status: DC | PRN

## 2021-05-09 MED ORDER — SODIUM CHLORIDE 0.9 % IV SOLN
1.0000 g | Freq: Once | INTRAVENOUS | Status: AC
  Administered 2021-05-09: 1 g via INTRAVENOUS
  Filled 2021-05-09: qty 10

## 2021-05-09 MED ORDER — LACTATED RINGERS IV BOLUS
1000.0000 mL | Freq: Once | INTRAVENOUS | Status: AC
  Administered 2021-05-09: 1000 mL via INTRAVENOUS

## 2021-05-09 MED ORDER — INSULIN ASPART 100 UNIT/ML IJ SOLN: 0.0000 [IU] | Freq: Every day | INTRAMUSCULAR | Status: DC

## 2021-05-09 MED ORDER — LACTATED RINGERS IV BOLUS (SEPSIS)
1000.0000 mL | Freq: Once | INTRAVENOUS | Status: AC
  Administered 2021-05-09: 1000 mL via INTRAVENOUS

## 2021-05-09 MED ORDER — ACETAMINOPHEN 325 MG PO TABS
650.0000 mg | ORAL_TABLET | Freq: Four times a day (QID) | ORAL | Status: DC | PRN
  Administered 2021-05-10: 650 mg via ORAL
  Filled 2021-05-09: qty 2

## 2021-05-09 MED ORDER — AMLODIPINE BESYLATE 5 MG PO TABS
5.0000 mg | ORAL_TABLET | Freq: Two times a day (BID) | ORAL | Status: DC
  Administered 2021-05-10 – 2021-05-17 (×14): 5 mg via ORAL
  Filled 2021-05-09 (×14): qty 1

## 2021-05-09 MED ORDER — SODIUM CHLORIDE 0.9 % IV SOLN: INTRAVENOUS | Status: DC

## 2021-05-09 MED ORDER — LISINOPRIL 20 MG PO TABS
30.0000 mg | ORAL_TABLET | Freq: Every day | ORAL | Status: DC
  Administered 2021-05-10 – 2021-05-17 (×7): 30 mg via ORAL
  Filled 2021-05-09 (×7): qty 1

## 2021-05-09 MED ORDER — INSULIN ASPART 100 UNIT/ML IJ SOLN
0.0000 [IU] | Freq: Three times a day (TID) | INTRAMUSCULAR | Status: DC
  Administered 2021-05-10: 3 [IU] via SUBCUTANEOUS

## 2021-05-09 MED ORDER — ACETAMINOPHEN 650 MG RE SUPP: 650.0000 mg | Freq: Four times a day (QID) | RECTAL | Status: DC | PRN

## 2021-05-09 MED ORDER — SODIUM CHLORIDE 0.9 % IV SOLN
2.0000 g | INTRAVENOUS | Status: AC
  Administered 2021-05-10 – 2021-05-16 (×7): 2 g via INTRAVENOUS
  Filled 2021-05-09 (×9): qty 20

## 2021-05-09 MED ORDER — PRAVASTATIN SODIUM 40 MG PO TABS
80.0000 mg | ORAL_TABLET | Freq: Every day | ORAL | Status: DC
  Administered 2021-05-10 – 2021-05-13 (×4): 80 mg via ORAL
  Filled 2021-05-09 (×4): qty 2

## 2021-05-09 MED ORDER — OXYCODONE HCL 5 MG PO TABS
5.0000 mg | ORAL_TABLET | ORAL | Status: DC | PRN
  Administered 2021-05-10 – 2021-05-13 (×11): 5 mg via ORAL
  Filled 2021-05-09 (×11): qty 1

## 2021-05-09 MED ORDER — ALFUZOSIN HCL ER 10 MG PO TB24
10.0000 mg | ORAL_TABLET | Freq: Every day | ORAL | Status: DC
  Administered 2021-05-10 – 2021-05-17 (×7): 10 mg via ORAL
  Filled 2021-05-09 (×8): qty 1

## 2021-05-09 MED ORDER — IOHEXOL 350 MG/ML SOLN
100.0000 mL | Freq: Once | INTRAVENOUS | Status: AC | PRN
  Administered 2021-05-09: 100 mL via INTRAVENOUS

## 2021-05-09 MED ORDER — CARVEDILOL 12.5 MG PO TABS
12.5000 mg | ORAL_TABLET | Freq: Two times a day (BID) | ORAL | Status: DC
  Administered 2021-05-09 – 2021-05-17 (×16): 12.5 mg via ORAL
  Filled 2021-05-09 (×17): qty 1

## 2021-05-09 MED ORDER — SODIUM CHLORIDE 0.9 % IV SOLN: 10.0000 mL/h | Freq: Once | INTRAVENOUS | Status: DC

## 2021-05-09 MED ORDER — MELATONIN 5 MG PO TABS
5.0000 mg | ORAL_TABLET | Freq: Once | ORAL | Status: AC
  Administered 2021-05-09: 5 mg via ORAL
  Filled 2021-05-09: qty 1

## 2021-05-09 NOTE — ED Notes (Signed)
Assisted pt w/ urinal use, urinated approx 400 cc of bloody urine.

## 2021-05-09 NOTE — ED Notes (Addendum)
Per CT need an IV 20 G upper FA or AC, was unsuccessful w/ [place]  IV placement, CT and EDP aware. Given a water pitcher and encouraged to drink.

## 2021-05-09 NOTE — Sepsis Progress Note (Signed)
Notified provider and bedside nurse of need to order repeat lactic acid.

## 2021-05-09 NOTE — ED Triage Notes (Signed)
BIB EMS from home, lives alone, complains of hematuria x a few weeks. Has yet to get into his PCP for evaluation. Tachycardic and tachypnic in triage. Temp w/ EMS was 99.7. LE edema as well.

## 2021-05-09 NOTE — ED Notes (Signed)
Blood consent signed under consent tabs in chart.

## 2021-05-09 NOTE — H&P (Signed)
History and Physical    MERT DIETRICK WPY:[id number] DOB: [birth year] DOA: 05/09/2021  PCP: [name], MD  Patient coming from: Home  Chief Complaint: hematuria  HPI: Jeremy Clark is a 55 y.o. male with medical history significant of DM2, CVA, prostate CA, morbid obesity, HTN, HLD. Presentign with hematuria. His symptoms started 2 weeks ago. There is no pain w/ or w/o urination. He has been following with urology (Dr. [name]). He reports a couple of in-office procedure so far for his evaluation. He was supposed to have a scope this week per his report. However, this did not happen. He continued to pass blood and clots with each episode. This continued through this morning. When he woke up this morning, he felt short of breath and fatigued. He checked his pulse ox and noticed that his HR was in the 150s. So he decided to come to the ED for help. He denies any other aggravating or alleviating factors.    ED Course: EDP spoke with urology (Dr. Tresa Wiswell). A CT hematuria study was performed. Urology recommended starting rocephin and holding plavix. TRH was called for admission.   Review of Systems:  Denies CP, abdominal pain, N/V/D, fevers, sick contacts. Review of systems is otherwise negative for all not mentioned in HPI.   PMHx Past Medical History:  Diagnosis Date   Abdominal hernia    Diabetes mellitus    High cholesterol    Hypertension    Prostate cancer ([place])     PSHx Past Surgical History:  Procedure Laterality Date   PROSTATE BIOPSY     SHOULDER SURGERY     right    SocHx  reports that he quit smoking about 17 years ago. His smoking use included cigarettes. He has a 10.00 pack-year smoking history. He has never used smokeless tobacco. He reports current alcohol use. He reports that he does not use drugs.  Allergies  Allergen Reactions   Doxycycline Rash    FamHx Family History  Problem Relation Age of Onset   Hypertension Mother    Prostate cancer Father     Aneurysm Daughter    Colon cancer Neg Hx    Pancreatic cancer Neg Hx    Breast cancer Neg Hx     Prior to Admission medications   Medication Sig Start Date End Date Taking? Authorizing Provider  alfuzosin (UROXATRAL) 10 MG 24 hr tablet Take 10 mg by mouth daily. 04/12/21  Yes [provider]  amLODipine (NORVASC) 5 MG tablet Take 5 mg by mouth 2 (two) times daily. 07/31/20  Yes [provider]  carvedilol (COREG) 12.5 MG tablet Take 12.5 mg by mouth 2 (two) times daily. 10/28/20  Yes [provider]  cetirizine (ZYRTEC) 10 MG tablet Take 10 mg by mouth daily. 04/12/21  Yes [provider]  lisinopril (PRINIVIL,ZESTRIL) 30 MG tablet Take 30 mg by mouth daily.   Yes [provider]  OZEMPIC, 2 MG/DOSE, 8 MG/3ML SOPN Inject 2 mg into the skin every Monday. 12/08/20  Yes [provider]  pravastatin (PRAVACHOL) 80 MG tablet Take 1 tablet (80 mg total) by mouth daily at 6 PM. 01/17/21  Yes Ogbata, Babs Bertin, MD  XIGDUO XR 09-998 MG TB24 Take 1 tablet by mouth 2 (two) times daily. 06/16/20  Yes [provider]  clopidogrel (PLAVIX) 75 MG tablet Take 1 tablet (75 mg total) by mouth daily. Patient not taking: Reported on 05/09/2021 01/18/21   Bonnell Public, MD    Physical  Exam: Vitals:   05/09/21 1330 05/09/21 1422 05/09/21 1430 05/09/21 1445  BP: (!) 182/98 (!) 166/91 (!) 160/107   Pulse: (!) 105 (!) 110 (!) 113 (!) 113  Resp: (!) 27 20  14   Temp:      TempSrc:      SpO2: 100% 100% 100% 100%  Weight:      Height:        General: 55 y.o. male resting in bed in NAD Eyes: PERRL, normal sclera ENMT: Nares patent w/o discharge, orophaynx clear, dentition normal, ears w/o discharge/lesions/ulcers Neck: Supple, trachea midline Cardiovascular: tachy, +S1, S2, no m/g/r, equal pulses throughout Respiratory: CTABL, no w/r/r, normal WOB GI: BS+, NDNT, large abdominal wall hernia noted MSK: No c/c; chronic BLE w/ venous/skin  changes Neuro: A&O x 3, no focal deficits Psyc: Appropriate interaction and affect, calm/cooperative  Labs on Admission: I have personally reviewed following labs and imaging studies  CBC: Recent Labs  Lab 05/09/21 1042 05/09/21 1453  WBC 5.7 6.0  NEUTROABS 4.7 4.4  HGB 7.8* 7.5*  HCT 24.6* 23.4*  MCV 98.8 97.5  PLT 307 017   Basic Metabolic Panel: Recent Labs  Lab 05/09/21 1042  NA 139  K 3.8  CL 106  CO2 19*  GLUCOSE 167*  BUN 9  CREATININE 0.96  CALCIUM 8.3*   GFR: Estimated Creatinine Clearance: 112.6 mL/min (by C-G formula based on SCr of 0.96 mg/dL). Liver Function Tests: Recent Labs  Lab 05/09/21 1042  AST 32  ALT 20  ALKPHOS 63  BILITOT 0.6  PROT 7.1  ALBUMIN 3.5   No results for input(s): LIPASE, AMYLASE in the last 168 hours. No results for input(s): AMMONIA in the last 168 hours. Coagulation Profile: Recent Labs  Lab 05/09/21 1042  INR 1.1   Cardiac Enzymes: No results for input(s): CKTOTAL, CKMB, CKMBINDEX, TROPONINI in the last 168 hours. BNP (last 3 results) No results for input(s): PROBNP in the last 8760 hours. HbA1C: No results for input(s): HGBA1C in the last 72 hours. CBG: No results for input(s): GLUCAP in the last 168 hours. Lipid Profile: No results for input(s): CHOL, HDL, LDLCALC, TRIG, CHOLHDL, LDLDIRECT in the last 72 hours. Thyroid Function Tests: No results for input(s): TSH, T4TOTAL, FREET4, T3FREE, THYROIDAB in the last 72 hours. Anemia Panel: No results for input(s): VITAMINB12, FOLATE, FERRITIN, TIBC, IRON, RETICCTPCT in the last 72 hours. Urine analysis:    Component Value Date/Time   COLORURINE RED (A) 05/09/2021 1042   APPEARANCEUR TURBID (A) 05/09/2021 1042   LABSPEC  05/09/2021 1042    TEST NOT REPORTED DUE TO COLOR INTERFERENCE OF URINE PIGMENT   PHURINE  05/09/2021 1042    TEST NOT REPORTED DUE TO COLOR INTERFERENCE OF URINE PIGMENT   GLUCOSEU (A) 05/09/2021 1042    TEST NOT REPORTED DUE TO COLOR  INTERFERENCE OF URINE PIGMENT   HGBUR (A) 05/09/2021 1042    TEST NOT REPORTED DUE TO COLOR INTERFERENCE OF URINE PIGMENT   BILIRUBINUR (A) 05/09/2021 1042    TEST NOT REPORTED DUE TO COLOR INTERFERENCE OF URINE PIGMENT   KETONESUR (A) 05/09/2021 1042    TEST NOT REPORTED DUE TO COLOR INTERFERENCE OF URINE PIGMENT   PROTEINUR (A) 05/09/2021 1042    TEST NOT REPORTED DUE TO COLOR INTERFERENCE OF URINE PIGMENT   UROBILINOGEN 1.0 12/07/2007 1440   NITRITE (A) 05/09/2021 1042    TEST NOT REPORTED DUE TO COLOR INTERFERENCE OF URINE PIGMENT   LEUKOCYTESUR (A) 05/09/2021 1042    TEST  NOT REPORTED DUE TO COLOR INTERFERENCE OF URINE PIGMENT    Radiological Exams on Admission: DG Chest Port 1 View  Result Date: 05/09/2021 CLINICAL DATA:  Chest pain. EXAM: PORTABLE CHEST 1 VIEW COMPARISON:  September 18, 2020. FINDINGS: The heart size and mediastinal contours are within normal limits. Both lungs are clear. The visualized skeletal structures are unremarkable. IMPRESSION: No active disease. Electronically Signed   By: Marijo Conception M.D.   On: 05/09/2021 11:34   CT HEMATURIA WORKUP  Result Date: 05/09/2021 CLINICAL DATA:  Gross hematuria. EXAM: CT ABDOMEN AND PELVIS WITHOUT AND WITH CONTRAST TECHNIQUE: Multidetector CT imaging of the abdomen and pelvis was performed following the standard protocol before and following the bolus administration of intravenous contrast. CONTRAST:  190mL OMNIPAQUE IOHEXOL 350 MG/ML SOLN COMPARISON:  CT 12/08/2007 FINDINGS: Lower chest: Lung bases are clear. Hepatobiliary: Low-attenuation throughout the liver. Gallbladder normal. Pancreas: Pancreas is normal. No ductal dilatation. No pancreatic inflammation. Spleen: Normal spleen Adrenals/urinary tract: Adrenal glands mildly thickened. No nephrolithiasis or ureterolithiasis. No enhancing renal cortical lesion. Simple fluid attenuation lesion in the LEFT renal cortex measuring 10 mm (image 26/10). Delayed imaging demonstrates no  filling defects in the collecting systems ureters. No bladder calculi, enhancing bladder lesions, or filling defect within the bladder. Stomach/Bowel: Stomach, small bowel, appendix, and cecum are normal. There is a large ventral abdominal wall hernia which contains a long segment (15-20 cm) nonobstructed transverse colon. Rectosigmoid colon normal. Vascular/Lymphatic: Abdominal aorta is normal caliber. No periportal or retroperitoneal adenopathy. No pelvic adenopathy. Reproductive: Fiducial markers in the prostate gland. No acute findings. Other: ventral abdominal hernia sac measures 13 cm. The abdominal wall defect measures 2.8 cm. Musculoskeletal: No aggressive osseous lesion. IMPRESSION: 1. No explanation for hematuria. No nephrolithiasis, ureterolithiasis, enhancing renal cortical lesion, or filling defects within the collecting systems. 2. No bladder stones or filling defects in the bladder which does not excluded a bladder lesion. 3. Simple cyst of the LEFT kidney (Bosniak 1). 4. Large ventral abdominal wall hernia contains a long segment of nonobstructed transverse colon. 5. Hepatic steatosis. Electronically Signed   By: Suzy Bouchard M.D.   On: 05/09/2021 14:22    EKG: Independently reviewed. Sinus tach, no st elevation  Assessment/Plan Hematuria Symptomatic anemia     - admit to inpt, tele     - Urology consulted (Dr. Tresa Gellatly), appreciate assistance     - continue rocephin, but at 2g q24h     - follow H&H q6h; transfuse as necessary     - in August his Hgb was 14, it's 7.5 now. Will transfuse 1 unit pRBCs now, since he is symptomatic     - hold plavix per urology recs  Sepsis secondary to UTI?     - continue rocephin     - follow cultures     - sepsis: tachypnea, tachycardia, lactic acid 5.5     - trend lactic acid     - give fluids  HTN     - resume home regimen     - PRN metoprolol  DM2     - A1c, SSI, glucose checks, DM diet  Morbid obesity     - counsel on diet, lifestyle  changes; follow up outpt  HLD Hx of CVA     - continue statin; holding plavix for now  Hx of prostate CA     - continue flomax; continue outpt follow up   DVT prophylaxis: SCDs  Code Status: FULL Family Communication: None at bedside  Consults called: EDP spoke with urology (Dr. Tresa Nash)   Status is: Inpatient  Remains inpatient appropriate because: Severity of illness  Takia Runyon A Caton Popowski DO [facility]  If 7PM-7AM, please contact night-coverage [email or web address]  05/09/2021, 3:11 PM

## 2021-05-09 NOTE — ED Notes (Signed)
Assisted pt w/ urinal use, 350 cc of bloody urine.

## 2021-05-09 NOTE — ED Notes (Signed)
Assisted pt w/ urinal use, pt urinated approx 300 cc of bloody urine.

## 2021-05-09 NOTE — ED Notes (Signed)
Bladder scan showed 120-150 cc of urine in bladder.

## 2021-05-09 NOTE — ED Notes (Signed)
Admitting provider, Marylyn Ishihara, at bedside.

## 2021-05-09 NOTE — Sepsis Progress Note (Signed)
[facility] following sepsis 

## 2021-05-09 NOTE — ED Provider Notes (Signed)
[facility] DEPT Provider Note   CSN: [id number] Arrival date & time: 05/09/21  1014     History Chief Complaint  Patient presents with   Hematuria    Jeremy Clark is a 55 y.o. male.  HPI 55 year old male presents with hematuria.  This has been ongoing for about a week.  He states his doctor is going to make some referrals to get him seen and they told him to stop his Plavix.  Occasionally he has had clots but not in the last couple days.  When he is had the clots he has had some pain with urinating but otherwise no dysuria.  He is frequently feeling an urge to urinate though sometimes he is unable to go.  However when he urinates he feels like he gets it all out.  However today he has been having some palpitations in his chest and a mild headache that he thinks is from the palpitations.  Because of this he decided to call 911.  He denies any chest pain, fever, cough, shortness of breath or abdominal pain.  He has some chronic low back pain that is unchanged.  Past Medical History:  Diagnosis Date   Abdominal hernia    Diabetes mellitus    High cholesterol    Hypertension    Prostate cancer [facility])     Patient Active Problem List   Diagnosis Date Noted   Sepsis ([place]) 05/09/2021   GAD (generalized anxiety disorder)    Acute ischemic stroke ([place]) 01/15/2021   Elevated PSA 01/01/2020   Prostate mass 01/01/2020   Prostate cancer ([place]) 01/01/2020   Vitamin D deficiency 11/08/2019   DM2 (diabetes mellitus, type 2) ([place]) 10/15/2018   Essential hypertension 10/15/2018   Hyperlipidemia 10/15/2018   Abdominal wall hernia 03/19/2015   Morbid obesity with BMI of 50.0-59.9, adult ([place]) 03/27/2014    Past Surgical History:  Procedure Laterality Date   PROSTATE BIOPSY     SHOULDER SURGERY     right       Family History  Problem Relation Age of Onset   Hypertension Mother    Prostate cancer Father    Aneurysm Daughter    Colon cancer Neg Hx     Pancreatic cancer Neg Hx    Breast cancer Neg Hx     Social History   Tobacco Use   Smoking status: Former    Packs/day: 0.50    Years: 20.00    Pack years: 10.00    Types: Cigarettes    Quit date: 05/31/2003    Years since quitting: 17.9   Smokeless tobacco: Never  Vaping Use   Vaping Use: Never used  Substance Use Topics   Alcohol use: Yes    Comment: occ   Drug use: No    Home Medications Prior to Admission medications   Medication Sig Start Date End Date Taking? Authorizing Provider  alfuzosin (UROXATRAL) 10 MG 24 hr tablet Take 10 mg by mouth daily. 04/12/21  Yes [provider]  amLODipine (NORVASC) 5 MG tablet Take 5 mg by mouth 2 (two) times daily. 07/31/20  Yes [provider]  carvedilol (COREG) 12.5 MG tablet Take 12.5 mg by mouth 2 (two) times daily. 10/28/20  Yes [provider]  cetirizine (ZYRTEC) 10 MG tablet Take 10 mg by mouth daily. 04/12/21  Yes [provider]  lisinopril (PRINIVIL,ZESTRIL) 30 MG tablet Take 30 mg by mouth daily.   Yes [provider]  OZEMPIC, 2 MG/DOSE, 8  MG/3ML SOPN Inject 2 mg into the skin every Monday. 12/08/20  Yes [provider]  pravastatin (PRAVACHOL) 80 MG tablet Take 1 tablet (80 mg total) by mouth daily at 6 PM. 01/17/21  Yes Ogbata, Babs Bertin, MD  XIGDUO XR 09-998 MG TB24 Take 1 tablet by mouth 2 (two) times daily. 06/16/20  Yes [provider]  clopidogrel (PLAVIX) 75 MG tablet Take 1 tablet (75 mg total) by mouth daily. Patient not taking: Reported on 05/09/2021 01/18/21   Bonnell Public, MD    Allergies    Doxycycline  Review of Systems   Review of Systems  Constitutional:  Negative for fever.  Respiratory:  Negative for shortness of breath.   Cardiovascular:  Positive for palpitations. Negative for chest pain.  Gastrointestinal:  Negative for abdominal pain.  Genitourinary:  Positive for dysuria, frequency and hematuria.  Neurological:  Positive for  headaches.  All other systems reviewed and are negative.  Physical Exam Updated Vital Signs BP (!) 160/107   Pulse (!) 113   Temp 99.5 F (37.5 C) (Rectal)   Resp 14   Ht 5\' 4"  (1.626 m)   Wt (!) 140.2 kg   SpO2 100%   BMI 53.04 kg/m   Physical Exam Vitals and nursing note reviewed.  Constitutional:      Appearance: He is well-developed. He is obese.  HENT:     Head: Normocephalic and atraumatic.     Right Ear: External ear normal.     Left Ear: External ear normal.     Nose: Nose normal.  Eyes:     General:        Right eye: No discharge.        Left eye: No discharge.  Cardiovascular:     Rate and Rhythm: Regular rhythm. Tachycardia present.     Heart sounds: Normal heart sounds.  Pulmonary:     Effort: Pulmonary effort is normal.     Breath sounds: Normal breath sounds.  Abdominal:     Palpations: Abdomen is soft.     Tenderness: There is no abdominal tenderness. There is no right CVA tenderness or left CVA tenderness.  Genitourinary:    Comments: No gross abnormality to penis/scrotum. No current blood/bleeding Musculoskeletal:     Cervical back: Neck supple.  Skin:    General: Skin is warm and dry.  Neurological:     Mental Status: He is alert.  Psychiatric:        Mood and Affect: Mood is not anxious.    ED Results / Procedures / Treatments   Labs (all labs ordered are listed, but only abnormal results are displayed) Labs Reviewed  LACTIC ACID, PLASMA - Abnormal; Notable for the following components:      Result Value   Lactic Acid, Venous 4.8 (*)    All other components within normal limits  LACTIC ACID, PLASMA - Abnormal; Notable for the following components:   Lactic Acid, Venous 5.5 (*)    All other components within normal limits  COMPREHENSIVE METABOLIC PANEL - Abnormal; Notable for the following components:   CO2 19 (*)    Glucose, Bld 167 (*)    Calcium 8.3 (*)    All other components within normal limits  CBC WITH DIFFERENTIAL/PLATELET -  Abnormal; Notable for the following components:   RBC 2.49 (*)    Hemoglobin 7.8 (*)    HCT 24.6 (*)    RDW 18.7 (*)    Lymphs Abs 0.5 (*)  All other components within normal limits  URINALYSIS, ROUTINE W REFLEX MICROSCOPIC - Abnormal; Notable for the following components:   Color, Urine RED (*)    APPearance TURBID (*)    Glucose, UA   (*)    Value: TEST NOT REPORTED DUE TO COLOR INTERFERENCE OF URINE PIGMENT   Hgb urine dipstick   (*)    Value: TEST NOT REPORTED DUE TO COLOR INTERFERENCE OF URINE PIGMENT   Bilirubin Urine   (*)    Value: TEST NOT REPORTED DUE TO COLOR INTERFERENCE OF URINE PIGMENT   Ketones, ur   (*)    Value: TEST NOT REPORTED DUE TO COLOR INTERFERENCE OF URINE PIGMENT   Protein, ur   (*)    Value: TEST NOT REPORTED DUE TO COLOR INTERFERENCE OF URINE PIGMENT   Nitrite   (*)    Value: TEST NOT REPORTED DUE TO COLOR INTERFERENCE OF URINE PIGMENT   Leukocytes,Ua   (*)    Value: TEST NOT REPORTED DUE TO COLOR INTERFERENCE OF URINE PIGMENT   RBC / HPF >50 (*)    WBC, UA >50 (*)    Bacteria, UA MANY (*)    All other components within normal limits  CBC WITH DIFFERENTIAL/PLATELET - Abnormal; Notable for the following components:   RBC 2.40 (*)    Hemoglobin 7.5 (*)    HCT 23.4 (*)    RDW 18.6 (*)    All other components within normal limits  CULTURE, BLOOD (ROUTINE X 2)  CULTURE, BLOOD (ROUTINE X 2)  URINE CULTURE  RESP PANEL BY RT-PCR (FLU A&B, COVID) ARPGX2  PROTIME-INR  APTT  LACTIC ACID, PLASMA  LACTIC ACID, PLASMA  TYPE AND SCREEN  ABO/RH  PREPARE RBC (CROSSMATCH)    EKG None  Radiology DG Chest Port 1 View  Result Date: 05/09/2021 CLINICAL DATA:  Chest pain. EXAM: PORTABLE CHEST 1 VIEW COMPARISON:  September 18, 2020. FINDINGS: The heart size and mediastinal contours are within normal limits. Both lungs are clear. The visualized skeletal structures are unremarkable. IMPRESSION: No active disease. Electronically Signed   By: Marijo Conception M.D.    On: 05/09/2021 11:34   CT HEMATURIA WORKUP  Result Date: 05/09/2021 CLINICAL DATA:  Gross hematuria. EXAM: CT ABDOMEN AND PELVIS WITHOUT AND WITH CONTRAST TECHNIQUE: Multidetector CT imaging of the abdomen and pelvis was performed following the standard protocol before and following the bolus administration of intravenous contrast. CONTRAST:  173mL OMNIPAQUE IOHEXOL 350 MG/ML SOLN COMPARISON:  CT 12/08/2007 FINDINGS: Lower chest: Lung bases are clear. Hepatobiliary: Low-attenuation throughout the liver. Gallbladder normal. Pancreas: Pancreas is normal. No ductal dilatation. No pancreatic inflammation. Spleen: Normal spleen Adrenals/urinary tract: Adrenal glands mildly thickened. No nephrolithiasis or ureterolithiasis. No enhancing renal cortical lesion. Simple fluid attenuation lesion in the LEFT renal cortex measuring 10 mm (image 26/10). Delayed imaging demonstrates no filling defects in the collecting systems ureters. No bladder calculi, enhancing bladder lesions, or filling defect within the bladder. Stomach/Bowel: Stomach, small bowel, appendix, and cecum are normal. There is a large ventral abdominal wall hernia which contains a long segment (15-20 cm) nonobstructed transverse colon. Rectosigmoid colon normal. Vascular/Lymphatic: Abdominal aorta is normal caliber. No periportal or retroperitoneal adenopathy. No pelvic adenopathy. Reproductive: Fiducial markers in the prostate gland. No acute findings. Other: ventral abdominal hernia sac measures 13 cm. The abdominal wall defect measures 2.8 cm. Musculoskeletal: No aggressive osseous lesion. IMPRESSION: 1. No explanation for hematuria. No nephrolithiasis, ureterolithiasis, enhancing renal cortical lesion, or filling defects within the collecting  systems. 2. No bladder stones or filling defects in the bladder which does not excluded a bladder lesion. 3. Simple cyst of the LEFT kidney (Bosniak 1). 4. Large ventral abdominal wall hernia contains a long  segment of nonobstructed transverse colon. 5. Hepatic steatosis. Electronically Signed   By: Suzy Bouchard M.D.   On: 05/09/2021 14:22    Procedures .Critical Care Performed by: Sherwood Gambler, MD Authorized by: Sherwood Gambler, MD   Critical care provider statement:    Critical care time (minutes):  45   Critical care time was exclusive of:  Separately billable procedures and treating other patients   Critical care was necessary to treat or prevent imminent or life-threatening deterioration of the following conditions:  Sepsis and circulatory failure   Critical care was time spent personally by me on the following activities:  Development of treatment plan with patient or surrogate, discussions with consultants, evaluation of patient's response to treatment, examination of patient, ordering and review of laboratory studies, ordering and review of radiographic studies, ordering and performing treatments and interventions, pulse oximetry, re-evaluation of patient's condition and review of old charts Ultrasound ED Peripheral IV (Provider)  Date/Time: 05/09/2021 1:10 PM Performed by: Sherwood Gambler, MD Authorized by: Sherwood Gambler, MD   Procedure details:    Indications: multiple failed IV attempts and poor IV access     Skin Prep: chlorhexidine gluconate     Location:  Left AC   Angiocath:  20 G   Bedside Ultrasound Guided: Yes     Patient tolerated procedure without complications: Yes     Dressing applied: Yes     Medications Ordered in ED Medications  lactated ringers bolus 1,000 mL (1,000 mLs Intravenous New Bag/Given 05/09/21 1501)  0.9 %  sodium chloride infusion (has no administration in time range)  lactated ringers bolus 1,000 mL (0 mLs Intravenous Stopped 05/09/21 1342)  cefTRIAXone (ROCEPHIN) 1 g in sodium chloride 0.9 % 100 mL IVPB (0 g Intravenous Stopped 05/09/21 1342)  lactated ringers bolus 1,000 mL (0 mLs Intravenous Stopped 05/09/21 1224)  iohexol (OMNIPAQUE)  350 MG/ML injection 100 mL (100 mLs Intravenous Contrast Given 05/09/21 1343)    ED Course  I have reviewed the triage vital signs and the nursing notes.  Pertinent labs & imaging results that were available during my care of the patient were reviewed by me and considered in my medical decision making (see chart for details).    MDM Rules/Calculators/A&P                           Patient is initially quite tachycardic though he is not distressed.  Appears to be a sinus tachycardia.  No ischemic symptoms.  This seems to be reactive to his blood loss, which is impressive with a hemoglobin below 8.  Baseline appears to be 14.  Otherwise, he has no significant pain.  He has a hernia that is not symptomatic today.  I discussed with Dr. Tresa Eyer, who recommends CT hematuria protocol given the impressive blood loss.  This is overall unremarkable besides some previous prostate treatment.  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  Agrees with antibiotics.  Otherwise advises to hold Plavix until urine is clear visually for 2 days.  Lactic acidosis could be from urinary tract infection or blood loss.  He is not hypotensive.  We will give IV fluids and a unit of blood.  Discussed with Dr. Marylyn Ishihara for admission. Final Clinical Impression(s) / ED Diagnoses  Final diagnoses:  Hematuria  Blood loss anemia  Lactic acidosis    Rx / DC Orders ED Discharge Orders     None        Sherwood Gambler, MD 05/09/21 1521

## 2021-05-10 ENCOUNTER — Encounter (HOSPITAL_COMMUNITY): Payer: Self-pay | Admitting: Internal Medicine

## 2021-05-10 DIAGNOSIS — E872 Acidosis, unspecified: Secondary | ICD-10-CM | POA: Diagnosis present

## 2021-05-10 DIAGNOSIS — D649 Anemia, unspecified: Secondary | ICD-10-CM | POA: Diagnosis present

## 2021-05-10 DIAGNOSIS — R310 Gross hematuria: Secondary | ICD-10-CM | POA: Diagnosis present

## 2021-05-10 DIAGNOSIS — Z8673 Personal history of transient ischemic attack (TIA), and cerebral infarction without residual deficits: Secondary | ICD-10-CM

## 2021-05-10 LAB — GLUCOSE, CAPILLARY
Glucose-Capillary: 146 mg/dL — ABNORMAL HIGH (ref 70–99)
Glucose-Capillary: 138 mg/dL — ABNORMAL HIGH (ref 70–99)
Glucose-Capillary: 128 mg/dL — ABNORMAL HIGH (ref 70–99)
Glucose-Capillary: 141 mg/dL — ABNORMAL HIGH (ref 70–99)

## 2021-05-10 LAB — TYPE AND SCREEN
ABO/RH(D): O POS
Antibody Screen: NEGATIVE
Unit division: 0
Unit division: 0

## 2021-05-10 LAB — COMPREHENSIVE METABOLIC PANEL
ALT: 17 U/L (ref 0–44)
AST: 22 U/L (ref 15–41)
Albumin: 3.1 g/dL — ABNORMAL LOW (ref 3.5–5.0)
Alkaline Phosphatase: 67 U/L (ref 38–126)
Anion gap: 10 (ref 5–15)
BUN: 11 mg/dL (ref 6–20)
CO2: 23 mmol/L (ref 22–32)
Calcium: 8.2 mg/dL — ABNORMAL LOW (ref 8.9–10.3)
Chloride: 102 mmol/L (ref 98–111)
Creatinine, Ser: 0.91 mg/dL (ref 0.61–1.24)
GFR, Estimated: >60 mL/min (ref >60)
Glucose, Bld: 138 mg/dL — ABNORMAL HIGH (ref 70–99)
Potassium: 3.3 mmol/L — ABNORMAL LOW (ref 3.5–5.1)
Sodium: 135 mmol/L (ref 135–145)
Total Bilirubin: 0.8 mg/dL (ref 0.3–1.2)
Total Protein: 6.3 g/dL — ABNORMAL LOW (ref 6.5–8.1)

## 2021-05-10 LAB — HEMOGLOBIN A1C
Hgb A1c MFr Bld: 4.8 % (ref 4.8–5.6)
Mean Plasma Glucose: 91.06 mg/dL

## 2021-05-10 LAB — HEMOGLOBIN AND HEMATOCRIT, BLOOD
HCT: 25 % — ABNORMAL LOW (ref 39.0–52.0)
HCT: 25 % — ABNORMAL LOW (ref 39.0–52.0)
HCT: 24.7 % — ABNORMAL LOW (ref 39.0–52.0)
Hemoglobin: 7.9 g/dL — ABNORMAL LOW (ref 13.0–17.0)
Hemoglobin: 8 g/dL — ABNORMAL LOW (ref 13.0–17.0)
Hemoglobin: 8 g/dL — ABNORMAL LOW (ref 13.0–17.0)

## 2021-05-10 LAB — CBC
HCT: 24.7 % — ABNORMAL LOW (ref 39.0–52.0)
Hemoglobin: 7.9 g/dL — ABNORMAL LOW (ref 13.0–17.0)
MCH: 30.5 pg (ref 26.0–34.0)
MCHC: 32 g/dL (ref 30.0–36.0)
MCV: 95.4 fL (ref 80.0–100.0)
Platelets: 277 10*3/uL (ref 150–400)
RBC: 2.59 MIL/uL — ABNORMAL LOW (ref 4.22–5.81)
RDW: 18.2 % — ABNORMAL HIGH (ref 11.5–15.5)
WBC: 6.2 10*3/uL (ref 4.0–10.5)
nRBC: 0.3 % — ABNORMAL HIGH (ref 0.0–0.2)

## 2021-05-10 LAB — BPAM RBC
Blood Product Expiration Date: 202212242359
Blood Product Expiration Date: 202301092359
ISSUE DATE / TIME: 202212111852
Unit Type and Rh: 5100
Unit Type and Rh: 5100

## 2021-05-10 LAB — URINE CULTURE: Culture: NO GROWTH

## 2021-05-10 LAB — PROTIME-INR
INR: 1.1 (ref 0.8–1.2)
Prothrombin Time: 13.9 seconds (ref 11.4–15.2)

## 2021-05-10 LAB — PROCALCITONIN: Procalcitonin: <0.1 ng/mL

## 2021-05-10 LAB — CORTISOL-AM, BLOOD: Cortisol - AM: 8.8 ug/dL (ref 6.7–22.6)

## 2021-05-10 LAB — LACTIC ACID, PLASMA: Lactic Acid, Venous: 1.9 mmol/L (ref 0.5–1.9)

## 2021-05-10 MED ORDER — BELLADONNA ALKALOIDS-OPIUM 16.2-60 MG RE SUPP
1.0000 | Freq: Once | RECTAL | Status: AC
  Administered 2021-05-10: 1 via RECTAL
  Filled 2021-05-10: qty 1

## 2021-05-10 MED ORDER — INSULIN ASPART 100 UNIT/ML IJ SOLN
0.0000 [IU] | Freq: Three times a day (TID) | INTRAMUSCULAR | Status: DC
  Administered 2021-05-10 – 2021-05-11 (×3): 2 [IU] via SUBCUTANEOUS
  Administered 2021-05-11: 3 [IU] via SUBCUTANEOUS
  Administered 2021-05-11 – 2021-05-12 (×2): 2 [IU] via SUBCUTANEOUS
  Administered 2021-05-12: 09:00:00 3 [IU] via SUBCUTANEOUS
  Administered 2021-05-12 – 2021-05-13 (×2): 2 [IU] via SUBCUTANEOUS
  Administered 2021-05-13: 3 [IU] via SUBCUTANEOUS
  Administered 2021-05-13 – 2021-05-14 (×2): 2 [IU] via SUBCUTANEOUS
  Administered 2021-05-14: 3 [IU] via SUBCUTANEOUS
  Administered 2021-05-14: 2 [IU] via SUBCUTANEOUS
  Administered 2021-05-15 (×2): 3 [IU] via SUBCUTANEOUS
  Administered 2021-05-15: 2 [IU] via SUBCUTANEOUS
  Administered 2021-05-16 (×3): 3 [IU] via SUBCUTANEOUS
  Administered 2021-05-17: 08:00:00 2 [IU] via SUBCUTANEOUS

## 2021-05-10 MED ORDER — POTASSIUM CHLORIDE CRYS ER 20 MEQ PO TBCR
30.0000 meq | EXTENDED_RELEASE_TABLET | ORAL | Status: AC
  Administered 2021-05-10 (×2): 30 meq via ORAL
  Filled 2021-05-10 (×2): qty 1

## 2021-05-10 MED ORDER — OXYBUTYNIN CHLORIDE 5 MG PO TABS
2.5000 mg | ORAL_TABLET | Freq: Once | ORAL | Status: DC
Start: 2021-05-10 — End: 2021-05-10

## 2021-05-10 MED ORDER — CHLORHEXIDINE GLUCONATE CLOTH 2 % EX PADS
6.0000 | MEDICATED_PAD | Freq: Every day | CUTANEOUS | Status: DC
  Administered 2021-05-10 – 2021-05-16 (×5): 6 via TOPICAL

## 2021-05-10 MED ORDER — OXYBUTYNIN CHLORIDE 5 MG PO TABS
5.0000 mg | ORAL_TABLET | Freq: Three times a day (TID) | ORAL | Status: DC | PRN
  Administered 2021-05-10 – 2021-05-14 (×7): 5 mg via ORAL
  Filled 2021-05-10 (×9): qty 1

## 2021-05-10 MED ORDER — OXYBUTYNIN CHLORIDE 5 MG PO TABS
2.5000 mg | ORAL_TABLET | Freq: Once | ORAL | Status: AC
  Administered 2021-05-10: 2.5 mg via ORAL

## 2021-05-10 NOTE — Progress Notes (Signed)
Report received from Valma Cava, RN. No change in assessment. Will continue plan of care. Cyndal Kasson, Laurel Dimmer, RN

## 2021-05-10 NOTE — Plan of Care (Signed)
  Problem: Education: Goal: Knowledge of General Education information will improve Description Including pain rating scale, medication(s)/side effects and non-pharmacologic comfort measures Outcome: Progressing   

## 2021-05-10 NOTE — H&P (View-Only) (Signed)
I have been asked to see the patient by Dr. Eric [place] for evaluation and management of gross hematuria.  History of present illness: 55 yo man with a history of prostate cancer treated with radiation completed in May 2022 who presented with gross hematuria.  Patient required blood transfusion overnight for hemoglobin 7.8.  He has not been passing significant clots.  Imaging without significant clot burden.  Patient is followed by Dr. [name] at [facility].  He is scheduled for an outpatient diagnostic cystoscopy on 05/20/21.     Review of systems: A 12 point comprehensive review of systems was obtained and is negative unless otherwise stated in the history of present illness.  Patient Active Problem List   Diagnosis Date Noted   Sepsis ([place]) 05/09/2021   GAD (generalized anxiety disorder)    Acute ischemic stroke ([place]) 01/15/2021   Elevated PSA 01/01/2020   Prostate mass 01/01/2020   Prostate cancer ([place]) 01/01/2020   Vitamin D deficiency 11/08/2019   DM2 (diabetes mellitus, type 2) ([place]) 10/15/2018   Essential hypertension 10/15/2018   Hyperlipidemia 10/15/2018   Abdominal wall hernia 03/19/2015   Morbid obesity with BMI of 50.0-59.9, adult ([place]) 03/27/2014    No current facility-administered medications on file prior to encounter.   Current Outpatient Medications on File Prior to Encounter  Medication Sig Dispense Refill   alfuzosin (UROXATRAL) 10 MG 24 hr tablet Take 10 mg by mouth daily.     amLODipine (NORVASC) 5 MG tablet Take 5 mg by mouth 2 (two) times daily.     carvedilol (COREG) 12.5 MG tablet Take 12.5 mg by mouth 2 (two) times daily.     cetirizine (ZYRTEC) 10 MG tablet Take 10 mg by mouth daily.     lisinopril (PRINIVIL,ZESTRIL) 30 MG tablet Take 30 mg by mouth daily.     OZEMPIC, 2 MG/DOSE, 8 MG/3ML SOPN Inject 2 mg into the skin every Monday.     pravastatin (PRAVACHOL) 80 MG tablet Take 1 tablet (80 mg total) by mouth daily at 6 PM. 30 tablet 1   XIGDUO XR  09-998 MG TB24 Take 1 tablet by mouth 2 (two) times daily.     clopidogrel (PLAVIX) 75 MG tablet Take 1 tablet (75 mg total) by mouth daily. (Patient not taking: Reported on 05/09/2021) 30 tablet 1    Past Medical History:  Diagnosis Date   Abdominal hernia    Diabetes mellitus    High cholesterol    Hypertension    Prostate cancer [facility])     Past Surgical History:  Procedure Laterality Date   PROSTATE BIOPSY     SHOULDER SURGERY     right    Social History   Tobacco Use   Smoking status: Former    Packs/day: 0.50    Years: 20.00    Pack years: 10.00    Types: Cigarettes    Quit date: 05/31/2003    Years since quitting: 17.9   Smokeless tobacco: Never  Vaping Use   Vaping Use: Never used  Substance Use Topics   Alcohol use: Yes    Comment: occ   Drug use: No    Family History  Problem Relation Age of Onset   Hypertension Mother    Prostate cancer Father    Aneurysm Daughter    Colon cancer Neg Hx    Pancreatic cancer Neg Hx    Breast cancer Neg Hx     PE: Vitals:   05/09/21 1913 05/09/21 2115 05/10/21 0106 05/10/21 0550  BP: (!) 150/76 139/70 125/90 130/75  Pulse: (!) 107 (!) 108 95 84  Resp: (!) 22 (!) 22 20 20   Temp: 99.6 F (37.6 C) 98.4 F (36.9 C) 98.4 F (36.9 C) 98.6 F (37 C)  TempSrc: Oral Oral Oral Oral  SpO2: 100% 100% 100% 100%  Weight:      Height:       Patient appears to be in no acute distress  patient is alert and oriented x3 Atraumatic normocephalic head No increased work of breathing, no audible wheezes/rhonchi Regular sinus rhythm/rate Abdomen is soft, nontender, nondistended GU: uncirc phallus retracted into pelvis, normal urethral meatus with blood at tip Lower extremities are symmetric without appreciable edema Grossly neurologically intact No identifiable skin lesions  Recent Labs    05/09/21 1042 05/09/21 1453 05/09/21 1856 05/10/21 0035  WBC 5.7 6.0  --  6.2  HGB 7.8* 7.5* 7.5* 7.9*  7.9*  HCT 24.6* 23.4*  24.1* 25.0*  24.7*   Recent Labs    05/09/21 1042 05/10/21 0035  NA 139 135  K 3.8 3.3*  CL 106 102  CO2 19* 23  GLUCOSE 167* 138*  BUN 9 11  CREATININE 0.96 0.91  CALCIUM 8.3* 8.2*   Recent Labs    05/09/21 1042 05/10/21 0035  INR 1.1 1.1   No results for input(s): LABURIN in the last 72 hours. Results for orders placed or performed during the hospital encounter of 05/09/21  Blood Culture (routine x 2)     Status: None (Preliminary result)   Collection Time: 05/09/21 10:42 AM   Specimen: BLOOD  Result Value Ref Range Status   Specimen Description   Final    BLOOD LEFT ANTECUBITAL Performed at [facility] [address] [place], [place] [address]    Special Requests   Final    BOTTLES DRAWN AEROBIC AND ANAEROBIC Blood Culture adequate volume Performed at [facility] [address] [place], [place] [address]    Culture   Final    NO GROWTH < 24 HOURS Performed at [facility] [address], [place], [place] [address]    Report Status PENDING  Incomplete  Blood Culture (routine x 2)     Status: None (Preliminary result)   Collection Time: 05/09/21 10:47 AM   Specimen: BLOOD LEFT FOREARM  Result Value Ref Range Status   Specimen Description   Final    BLOOD LEFT FOREARM Performed at [facility] [address], [place], [place] [address]    Special Requests   Final    BOTTLES DRAWN AEROBIC AND ANAEROBIC Blood Culture adequate volume Performed at [facility] [address] [place], [place] [address]    Culture   Final    NO GROWTH < 24 HOURS Performed at [facility] [address], [place], [place] [address]    Report Status PENDING  Incomplete  Resp Panel by RT-PCR (Flu A&B, Covid) Nasopharyngeal Swab     Status: None   Collection Time: 05/09/21  2:48 PM   Specimen: Nasopharyngeal Swab; Nasopharyngeal(NP) swabs in vial transport medium  Result Value Ref Range  Status   SARS Coronavirus 2 by RT PCR NEGATIVE NEGATIVE Final    Comment: (NOTE) SARS-CoV-2 target nucleic acids are NOT DETECTED.  The SARS-CoV-2 RNA is generally detectable in upper respiratory specimens during the acute phase of infection. The lowest concentration of SARS-CoV-2 viral copies this assay can detect is 138 copies/mL. A negative  result does not preclude SARS-Cov-2 infection and should not be used as the sole basis for treatment or other patient management decisions. A negative result may occur with  improper specimen collection/handling, submission of specimen other than nasopharyngeal swab, presence of viral mutation(s) within the areas targeted by this assay, and inadequate number of viral copies(<138 copies/mL). A negative result must be combined with clinical observations, patient history, and epidemiological information. The expected result is Negative.  Fact Sheet for Patients:  EntrepreneurPulse.com.au  Fact Sheet for Healthcare Providers:  IncredibleEmployment.be  This test is no t yet approved or cleared by the [place] FDA and  has been authorized for detection and/or diagnosis of SARS-CoV-2 by FDA under an Emergency Use Authorization (EUA). This EUA will remain  in effect (meaning this test can be used) for the duration of the COVID-19 declaration under Section 564(b)(1) of the Act, 21 U.S.C.section 360bbb-3(b)(1), unless the authorization is terminated  or revoked sooner.       Influenza A by PCR NEGATIVE NEGATIVE Final   Influenza B by PCR NEGATIVE NEGATIVE Final    Comment: (NOTE) The Xpert Xpress SARS-CoV-2/FLU/RSV plus assay is intended as an aid in the diagnosis of influenza from Nasopharyngeal swab specimens and should not be used as a sole basis for treatment. Nasal washings and aspirates are unacceptable for Xpert Xpress SARS-CoV-2/FLU/RSV testing.  Fact Sheet for  Patients: EntrepreneurPulse.com.au  Fact Sheet for Healthcare Providers: IncredibleEmployment.be  This test is not yet approved or cleared by the [place] FDA and has been authorized for detection and/or diagnosis of SARS-CoV-2 by FDA under an Emergency Use Authorization (EUA). This EUA will remain in effect (meaning this test can be used) for the duration of the COVID-19 declaration under Section 564(b)(1) of the Act, 21 U.S.C. section 360bbb-3(b)(1), unless the authorization is terminated or revoked.  Performed at [facility], [place] [address], [place], [place] [address]     Imaging: CT Hematuria 05/10/21 IMPRESSION: 1. No explanation for hematuria. No nephrolithiasis, ureterolithiasis, enhancing renal cortical lesion, or filling defects within the collecting systems. 2. No bladder stones or filling defects in the bladder which does not excluded a bladder lesion. 3. Simple cyst of the LEFT kidney (Bosniak 1). 4. Large ventral abdominal wall hernia contains a long segment of nonobstructed transverse colon. 5. Hepatic steatosis.     Electronically Signed   By: Suzy Bouchard M.D.   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  Unable to place catheter larger than 20 Fr due to narrowing at meatus) -titrate CBI to clear urine -NPO at midnight -patient will need outpatient diagnostic cystoscopy at some point -will let Dr. [name] know that patient has been admitted  2. Renal cyst:  -simple renal cyst on L, bosniak 1, no further imaging necessary    Ileana Chalupa D Jamariyah Johannsen

## 2021-05-10 NOTE — Consult Note (Signed)
I have been asked to see the patient by Dr. Eric [place] for evaluation and management of gross hematuria.  History of present illness: 55 yo man with a history of prostate cancer treated with radiation completed in May 2022 who presented with gross hematuria.  Patient required blood transfusion overnight for hemoglobin 7.8.  He has not been passing significant clots.  Imaging without significant clot burden.  Patient is followed by Dr. [name] at [facility].  He is scheduled for an outpatient diagnostic cystoscopy on 05/20/21.     Review of systems: A 12 point comprehensive review of systems was obtained and is negative unless otherwise stated in the history of present illness.  Patient Active Problem List   Diagnosis Date Noted   Sepsis ([place]) 05/09/2021   GAD (generalized anxiety disorder)    Acute ischemic stroke ([place]) 01/15/2021   Elevated PSA 01/01/2020   Prostate mass 01/01/2020   Prostate cancer ([place]) 01/01/2020   Vitamin D deficiency 11/08/2019   DM2 (diabetes mellitus, type 2) ([place]) 10/15/2018   Essential hypertension 10/15/2018   Hyperlipidemia 10/15/2018   Abdominal wall hernia 03/19/2015   Morbid obesity with BMI of 50.0-59.9, adult ([place]) 03/27/2014    No current facility-administered medications on file prior to encounter.   Current Outpatient Medications on File Prior to Encounter  Medication Sig Dispense Refill   alfuzosin (UROXATRAL) 10 MG 24 hr tablet Take 10 mg by mouth daily.     amLODipine (NORVASC) 5 MG tablet Take 5 mg by mouth 2 (two) times daily.     carvedilol (COREG) 12.5 MG tablet Take 12.5 mg by mouth 2 (two) times daily.     cetirizine (ZYRTEC) 10 MG tablet Take 10 mg by mouth daily.     lisinopril (PRINIVIL,ZESTRIL) 30 MG tablet Take 30 mg by mouth daily.     OZEMPIC, 2 MG/DOSE, 8 MG/3ML SOPN Inject 2 mg into the skin every Monday.     pravastatin (PRAVACHOL) 80 MG tablet Take 1 tablet (80 mg total) by mouth daily at 6 PM. 30 tablet 1   XIGDUO XR  09-998 MG TB24 Take 1 tablet by mouth 2 (two) times daily.     clopidogrel (PLAVIX) 75 MG tablet Take 1 tablet (75 mg total) by mouth daily. (Patient not taking: Reported on 05/09/2021) 30 tablet 1    Past Medical History:  Diagnosis Date   Abdominal hernia    Diabetes mellitus    High cholesterol    Hypertension    Prostate cancer [facility])     Past Surgical History:  Procedure Laterality Date   PROSTATE BIOPSY     SHOULDER SURGERY     right    Social History   Tobacco Use   Smoking status: Former    Packs/day: 0.50    Years: 20.00    Pack years: 10.00    Types: Cigarettes    Quit date: 05/31/2003    Years since quitting: 17.9   Smokeless tobacco: Never  Vaping Use   Vaping Use: Never used  Substance Use Topics   Alcohol use: Yes    Comment: occ   Drug use: No    Family History  Problem Relation Age of Onset   Hypertension Mother    Prostate cancer Father    Aneurysm Daughter    Colon cancer Neg Hx    Pancreatic cancer Neg Hx    Breast cancer Neg Hx     PE: Vitals:   05/09/21 1913 05/09/21 2115 05/10/21 0106 05/10/21 0550  BP: (!) 150/76 139/70 125/90 130/75  Pulse: (!) 107 (!) 108 95 84  Resp: (!) 22 (!) 22 20 20   Temp: 99.6 F (37.6 C) 98.4 F (36.9 C) 98.4 F (36.9 C) 98.6 F (37 C)  TempSrc: Oral Oral Oral Oral  SpO2: 100% 100% 100% 100%  Weight:      Height:       Patient appears to be in no acute distress  patient is alert and oriented x3 Atraumatic normocephalic head No increased work of breathing, no audible wheezes/rhonchi Regular sinus rhythm/rate Abdomen is soft, nontender, nondistended GU: uncirc phallus retracted into pelvis, normal urethral meatus with blood at tip Lower extremities are symmetric without appreciable edema Grossly neurologically intact No identifiable skin lesions  Recent Labs    05/09/21 1042 05/09/21 1453 05/09/21 1856 05/10/21 0035  WBC 5.7 6.0  --  6.2  HGB 7.8* 7.5* 7.5* 7.9*  7.9*  HCT 24.6* 23.4*  24.1* 25.0*  24.7*   Recent Labs    05/09/21 1042 05/10/21 0035  NA 139 135  K 3.8 3.3*  CL 106 102  CO2 19* 23  GLUCOSE 167* 138*  BUN 9 11  CREATININE 0.96 0.91  CALCIUM 8.3* 8.2*   Recent Labs    05/09/21 1042 05/10/21 0035  INR 1.1 1.1   No results for input(s): LABURIN in the last 72 hours. Results for orders placed or performed during the hospital encounter of 05/09/21  Blood Culture (routine x 2)     Status: None (Preliminary result)   Collection Time: 05/09/21 10:42 AM   Specimen: BLOOD  Result Value Ref Range Status   Specimen Description   Final    BLOOD LEFT ANTECUBITAL Performed at [facility] [address], [place], [place] [address]    Special Requests   Final    BOTTLES DRAWN AEROBIC AND ANAEROBIC Blood Culture adequate volume Performed at [facility] [address], [place], [place] [address]    Culture   Final    NO GROWTH < 24 HOURS Performed at [facility] [address] [place], [place] [address]    Report Status PENDING  Incomplete  Blood Culture (routine x 2)     Status: None (Preliminary result)   Collection Time: 05/09/21 10:47 AM   Specimen: BLOOD LEFT FOREARM  Result Value Ref Range Status   Specimen Description   Final    BLOOD LEFT FOREARM Performed at [facility] [address], [place], [place] [address]    Special Requests   Final    BOTTLES DRAWN AEROBIC AND ANAEROBIC Blood Culture adequate volume Performed at [facility] [address] [place], [place] [address]    Culture   Final    NO GROWTH < 24 HOURS Performed at [facility] [address], [place], [place] [address]    Report Status PENDING  Incomplete  Resp Panel by RT-PCR (Flu A&B, Covid) Nasopharyngeal Swab     Status: None   Collection Time: 05/09/21  2:48 PM   Specimen: Nasopharyngeal Swab; Nasopharyngeal(NP) swabs in vial transport medium  Result Value Ref Range  Status   SARS Coronavirus 2 by RT PCR NEGATIVE NEGATIVE Final    Comment: (NOTE) SARS-CoV-2 target nucleic acids are NOT DETECTED.  The SARS-CoV-2 RNA is generally detectable in upper respiratory specimens during the acute phase of infection. The lowest concentration of SARS-CoV-2 viral copies this assay can detect is 138 copies/mL. A negative  result does not preclude SARS-Cov-2 infection and should not be used as the sole basis for treatment or other patient management decisions. A negative result may occur with  improper specimen collection/handling, submission of specimen other than nasopharyngeal swab, presence of viral mutation(s) within the areas targeted by this assay, and inadequate number of viral copies(<138 copies/mL). A negative result must be combined with clinical observations, patient history, and epidemiological information. The expected result is Negative.  Fact Sheet for Patients:  EntrepreneurPulse.com.au  Fact Sheet for Healthcare Providers:  IncredibleEmployment.be  This test is no t yet approved or cleared by the [place] FDA and  has been authorized for detection and/or diagnosis of SARS-CoV-2 by FDA under an Emergency Use Authorization (EUA). This EUA will remain  in effect (meaning this test can be used) for the duration of the COVID-19 declaration under Section 564(b)(1) of the Act, 21 U.S.C.section 360bbb-3(b)(1), unless the authorization is terminated  or revoked sooner.       Influenza A by PCR NEGATIVE NEGATIVE Final   Influenza B by PCR NEGATIVE NEGATIVE Final    Comment: (NOTE) The Xpert Xpress SARS-CoV-2/FLU/RSV plus assay is intended as an aid in the diagnosis of influenza from Nasopharyngeal swab specimens and should not be used as a sole basis for treatment. Nasal washings and aspirates are unacceptable for Xpert Xpress SARS-CoV-2/FLU/RSV testing.  Fact Sheet for  Patients: EntrepreneurPulse.com.au  Fact Sheet for Healthcare Providers: IncredibleEmployment.be  This test is not yet approved or cleared by the [place] FDA and has been authorized for detection and/or diagnosis of SARS-CoV-2 by FDA under an Emergency Use Authorization (EUA). This EUA will remain in effect (meaning this test can be used) for the duration of the COVID-19 declaration under Section 564(b)(1) of the Act, 21 U.S.C. section 360bbb-3(b)(1), unless the authorization is terminated or revoked.  Performed at [facility], [place] [address], [place], [place] [address]     Imaging: CT Hematuria 05/10/21 IMPRESSION: 1. No explanation for hematuria. No nephrolithiasis, ureterolithiasis, enhancing renal cortical lesion, or filling defects within the collecting systems. 2. No bladder stones or filling defects in the bladder which does not excluded a bladder lesion. 3. Simple cyst of the LEFT kidney (Bosniak 1). 4. Large ventral abdominal wall hernia contains a long segment of nonobstructed transverse colon. 5. Hepatic steatosis.     Electronically Signed   By: Suzy Bouchard M.D.   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  Unable to place catheter larger than 20 Fr due to narrowing at meatus) -titrate CBI to clear urine -NPO at midnight -patient will need outpatient diagnostic cystoscopy at some point -will let Dr. [name] know that patient has been admitted  2. Renal cyst:  -simple renal cyst on L, bosniak 1, no further imaging necessary    Yukio Bisping D Eero Dini

## 2021-05-10 NOTE — Progress Notes (Addendum)
PROGRESS NOTE    DEAUNDRA KUTZER  VZD:[id number] DOB: 1966/01/11 DOA: 05/09/2021 PCP: [name], MD    Brief Narrative:  Jeremy Clark is a 55 year old male with past medical history significant for type 2 diabetes mellitus, history of CVA, prostate cancer, essential hypertension, hyperlipidemia, morbid obesity who presented to [facility] ED on 12/11 with recurrent hematuria.  Patient reports symptoms initially started about 2-3 weeks ago and has been followed by urology, Dr. [name].  Patient reports had a Foley catheter placed previously in the clinic with irrigation with resolution of hematuria.  Patient was told he was going to have a cystoscopy in the near future but has yet to be scheduled.  Patient denies any pain with urination.  Patient now reports some shortness of breath, fatigue and dizziness.  Upon checking his pulse oximetry at home, he noticed his heart rate was in the 150s so he decided come to the ED for further evaluation.  Patient denies any other anticoagulants.  In the ED, temperature 98.1 F, HR 136, RR 18, BP 163/88, SPO2 100% on room air.  Sodium 139, potassium 3.8, chloride 106, CO2 19, glucose 167, BUN 9, creatinine 0.96.  AST 32, ALT 20, total bilirubin 0.6.  WBC 5.7, hemoglobin 7.8, platelets 307.  Lactic acid 4.8.  Urinalysis turbid, red in color, many bacteria, greater than 50 WBCs.  COVID-19 PCR negative.  Influenza A/B PCR negative.  Blood cultures x2 ordered.  Urine culture ordered.  CT hematuria scan 12/11 withno explanation for hematuria, no nephrolithiasis/ureterolithiasis, no enhancing renal cortical lesion or filling defects within the collecting system; no bladder stones/filling defects in the bladder. EDP discussed case with urology on-call, Dr. 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  Hospital service was consulted for further evaluation management of symptomatic anemia with lactic  acidosis secondary to gross hematuria.     Assessment & Plan:   Principal Problem:   Hematuria, gross Active Problems:   DM2 (diabetes mellitus, type 2) (HCC)   Essential hypertension   Hyperlipidemia   Abdominal wall hernia   Prostate cancer (HCC)   GAD (generalized anxiety disorder)   Lactic acidosis   Symptomatic anemia   History of CVA (cerebrovascular accident)   Symptomatic anemia 2/2 gross hematuria Patient presenting to ED with shortness of breath, tachycardia, weakness and dizziness with recurrent gross hematuria.  Patient follows with urology outpatient, Dr. [name].  Patient's hemoglobin was noted to be 7.9 on admission with a baseline 14.2 January 16, 2021.  Patient denies any discomfort on urination.  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 --Urology consulted, discussed with Dr. [name] this a.m. --Hgb 7.8>7.5>7.9 --s/p 1u pRBC 12/11 --Ceftriaxone 2g IV q24h --Holding home Plavix --Likely plan for Foley catheter placement with bladder irrigation, possible cystoscopy tomorrow --Continue to monitor hemoglobin closely and transfuse as needed --N.p.o. after midnight  Lactic acidosis: Resolved Lactic acid elevated 4.2 on admission, etiology likely secondary to blood loss anemia with dehydration.  Patient is afebrile without leukocytosis.  Procalcitonin within normal limits.  Sepsis ruled out.  Treated with 3L IVF bolus with normalization of lactic acid. --Lactic acid 4.2>>1.9 --Blood cultures x2: Pending --Urine culture: Pending --Supportive care, NS at 70 mL/h  Hypokalemia Potassium 3.3 this morning, will replete. --Repeat electrolytes in a.m. to include magnesium  Type 2 diabetes mellitus Hemoglobin A1c 4.8, well controlled.  On Ozempic 2 mg injected weekly, and dapagliflozin/metformin 5-1000mg  BID at  home. --Hold home medications while inpatient --moderate SSI for coverage  --CBGs qAC/HS  Essential  hypertension --Amlodipine 5 mg p.o. twice daily --Carvedilol 12.5 mg p.o. twice daily --Lisinopril 30 mg p.o. daily  Hyperlipidemia: Pravastatin 80 mg p.o. daily  Hx prostate cancer Follows with urology outpatient, Dr. [name].  Completed radiation therapy May 2022. --Alfuzosin 10 mg p.o. daily  Hx CVA --Holding home Plavix  Ventral abdominal hernia Patient with large ventral hernia that is easily reducible on physical exam.  CT hematuria scan with large ventral abdominal wall hernia containing a long segment of nonobstructive transverse colon.  Morbid obesity Body mass index is 53.04 kg/m.  Discussed with patient needs for aggressive lifestyle changes/weight loss as this complicates all facets of care.  Outpatient follow-up with PCP.  May benefit from bariatric evaluation outpatient.     DVT prophylaxis: SCDs Start: 05/09/21 1756   Code Status: Full Code Family Communication: No family present at bedside this morning  Disposition Plan:  Level of care: Telemetry Status is: Inpatient  Remains inpatient appropriate because: Pending urology evaluation, will likely need cystoscopy, IV antibiotics, needs further monitoring of hemoglobin given inappropriate rise following transfusion.     Consultants:  Urology, Dr. [name]  Procedures:  None  Antimicrobials:  Ceftriaxone 12/11>>   Subjective: Patient seen examined bedside, resting comfortably.  RN present.  Continues with gross hematuria.  Denies any issues with urination, no pain.  Tachycardia, shortness of breath, dizziness now resolved following transfusion.  Received 1 unit PRBC yesterday with an appropriate rise of hemoglobin from 7.5-7.9.  Discussed with urology this morning, Dr. [name] who requests catheter cart at bedside and n.p.o. after midnight for possible procedure tomorrow.  No other questions or concerns at this time.  Denies headache, no dizziness, no chest pain, no shortness of breath, no abdominal pain, no  weakness, no fatigue, no fever/chills/night sweats, no nausea/vomiting/diarrhea, no paresthesias.  No acute events overnight per nursing staff.  Objective: Vitals:   05/09/21 1913 05/09/21 2115 05/10/21 0106 05/10/21 0550  BP: (!) 150/76 139/70 125/90 130/75  Pulse: (!) 107 (!) 108 95 84  Resp: (!) 22 (!) 22 20 20   Temp: 99.6 F (37.6 C) 98.4 F (36.9 C) 98.4 F (36.9 C) 98.6 F (37 C)  TempSrc: Oral Oral Oral Oral  SpO2: 100% 100% 100% 100%  Weight:      Height:        Intake/Output Summary (Last 24 hours) at 05/10/2021 0934 Last data filed at 05/10/2021 0700 Gross per 24 hour  Intake 4907.18 ml  Output 600 ml  Net 4307.18 ml   Filed Weights   05/09/21 1109  Weight: (!) 140.2 kg    Examination:  General exam: Appears calm and comfortable, morbidly obese Respiratory system: Clear to auscultation. Respiratory effort normal.  On room air Cardiovascular system: S1 & S2 heard, RRR. No JVD, murmurs, rubs, gallops or clicks. No pedal edema. Gastrointestinal system: Abdomen is nondistended, soft and nontender.  Noted large abdominal ventral hernia which is easily reducible, no organomegaly or masses felt. Normal bowel sounds heard. Central nervous system: Alert and oriented. No focal neurological deficits. Extremities: Symmetric 5 x 5 power. Skin: No rashes, lesions or ulcers Psychiatry: Judgement and insight appear normal. Mood & affect appropriate.     Data Reviewed: I have personally reviewed following labs and imaging studies  CBC: Recent Labs  Lab 05/09/21 1042 05/09/21 1453 05/09/21 1856 05/10/21 0035  WBC 5.7 6.0  --  6.2  NEUTROABS 4.7 4.4  --   --  HGB 7.8* 7.5* 7.5* 7.9*  7.9*  HCT 24.6* 23.4* 24.1* 25.0*  24.7*  MCV 98.8 97.5  --  95.4  PLT 307 286  --  254   Basic Metabolic Panel: Recent Labs  Lab 05/09/21 1042 05/10/21 0035  NA 139 135  K 3.8 3.3*  CL 106 102  CO2 19* 23  GLUCOSE 167* 138*  BUN 9 11  CREATININE 0.96 0.91  CALCIUM 8.3*  8.2*   GFR: Estimated Creatinine Clearance: 118.8 mL/min (by C-G formula based on SCr of 0.91 mg/dL). Liver Function Tests: Recent Labs  Lab 05/09/21 1042 05/10/21 0035  AST 32 22  ALT 20 17  ALKPHOS 63 67  BILITOT 0.6 0.8  PROT 7.1 6.3*  ALBUMIN 3.5 3.1*   No results for input(s): LIPASE, AMYLASE in the last 168 hours. No results for input(s): AMMONIA in the last 168 hours. Coagulation Profile: Recent Labs  Lab 05/09/21 1042 05/10/21 0035  INR 1.1 1.1   Cardiac Enzymes: No results for input(s): CKTOTAL, CKMB, CKMBINDEX, TROPONINI in the last 168 hours. BNP (last 3 results) No results for input(s): PROBNP in the last 8760 hours. HbA1C: Recent Labs    05/10/21 0035  HGBA1C 4.8   CBG: Recent Labs  Lab 05/09/21 1808 05/09/21 2141 05/10/21 0809  GLUCAP 146* 151* 146*   Lipid Profile: No results for input(s): CHOL, HDL, LDLCALC, TRIG, CHOLHDL, LDLDIRECT in the last 72 hours. Thyroid Function Tests: No results for input(s): TSH, T4TOTAL, FREET4, T3FREE, THYROIDAB in the last 72 hours. Anemia Panel: No results for input(s): VITAMINB12, FOLATE, FERRITIN, TIBC, IRON, RETICCTPCT in the last 72 hours. Sepsis Labs: Recent Labs  Lab 05/09/21 1459 05/09/21 1630 05/09/21 1856 05/10/21 0035  PROCALCITON  --   --   --  <0.10  LATICACIDVEN 4.2* 3.7* 3.5* 1.9    Recent Results (from the past 240 hour(s))  Blood Culture (routine x 2)     Status: None (Preliminary result)   Collection Time: 05/09/21 10:42 AM   Specimen: BLOOD  Result Value Ref Range Status   Specimen Description   Final    BLOOD LEFT ANTECUBITAL Performed at [facility], [place] [address], [place], [place] [address]    Special Requests   Final    BOTTLES DRAWN AEROBIC AND ANAEROBIC Blood Culture adequate volume Performed at [facility] [address], [place], [place] [address]    Culture   Final    NO GROWTH < 24 HOURS Performed at [place] [address], [place], [place] [address]    Report Status PENDING  Incomplete  Blood Culture (routine x 2)     Status: None (Preliminary result)   Collection Time: 05/09/21 10:47 AM   Specimen: BLOOD LEFT FOREARM  Result Value Ref Range Status   Specimen Description   Final    BLOOD LEFT FOREARM Performed at [facility] [address] [place], [place] [address]    Special Requests   Final    BOTTLES DRAWN AEROBIC AND ANAEROBIC Blood Culture adequate volume Performed at [facility] [address] [place], [place] [address]    Culture   Final    NO GROWTH < 24 HOURS Performed at [facility] [address] [place],  [address]    Report Status PENDING  Incomplete  Resp Panel by RT-PCR (Flu A&B, Covid) Nasopharyngeal Swab     Status: None   Collection Time: 05/09/21  2:48 PM  Specimen: Nasopharyngeal Swab; Nasopharyngeal(NP) swabs in vial transport medium  Result Value Ref Range Status   SARS Coronavirus 2 by RT PCR NEGATIVE NEGATIVE Final    Comment: (NOTE) SARS-CoV-2 target nucleic acids are NOT DETECTED.  The SARS-CoV-2 RNA is generally detectable in upper respiratory specimens during the acute phase of infection. The lowest concentration of SARS-CoV-2 viral copies this assay can detect is 138 copies/mL. A negative result does not preclude SARS-Cov-2 infection and should not be used as the sole basis for treatment or other patient management decisions. A negative result may occur with  improper specimen collection/handling, submission of specimen other than nasopharyngeal swab, presence of viral mutation(s) within the areas targeted by this assay, and inadequate number of viral copies(<138 copies/mL). A negative result must be combined with clinical observations, patient history, and epidemiological information. The expected result is Negative.  Fact Sheet for Patients:  EntrepreneurPulse.com.au  Fact  Sheet for Healthcare Providers:  IncredibleEmployment.be  This test is no t yet approved or cleared by the [place] FDA and  has been authorized for detection and/or diagnosis of SARS-CoV-2 by FDA under an Emergency Use Authorization (EUA). This EUA will remain  in effect (meaning this test can be used) for the duration of the COVID-19 declaration under Section 564(b)(1) of the Act, 21 U.S.C.section 360bbb-3(b)(1), unless the authorization is terminated  or revoked sooner.       Influenza A by PCR NEGATIVE NEGATIVE Final   Influenza B by PCR NEGATIVE NEGATIVE Final    Comment: (NOTE) The Xpert Xpress SARS-CoV-2/FLU/RSV plus assay is intended as an aid in the diagnosis of influenza from Nasopharyngeal swab specimens and should not be used as a sole basis for treatment. Nasal washings and aspirates are unacceptable for Xpert Xpress SARS-CoV-2/FLU/RSV testing.  Fact Sheet for Patients: EntrepreneurPulse.com.au  Fact Sheet for Healthcare Providers: IncredibleEmployment.be  This test is not yet approved or cleared by the [place] FDA and has been authorized for detection and/or diagnosis of SARS-CoV-2 by FDA under an Emergency Use Authorization (EUA). This EUA will remain in effect (meaning this test can be used) for the duration of the COVID-19 declaration under Section 564(b)(1) of the Act, 21 U.S.C. section 360bbb-3(b)(1), unless the authorization is terminated or revoked.  Performed at [facility], [place] [address], [place], [place] [address]          Radiology Studies: [facility] Chest Port 1 View  Result Date: 05/09/2021 CLINICAL DATA:  Chest pain. EXAM: PORTABLE CHEST 1 VIEW COMPARISON:  September 18, 2020. FINDINGS: The heart size and mediastinal contours are within normal limits. Both lungs are clear. The visualized skeletal structures are unremarkable. IMPRESSION: No active disease.  Electronically Signed   By: Marijo Conception M.D.   On: 05/09/2021 11:34   CT HEMATURIA WORKUP  Result Date: 05/09/2021 CLINICAL DATA:  Gross hematuria. EXAM: CT ABDOMEN AND PELVIS WITHOUT AND WITH CONTRAST TECHNIQUE: Multidetector CT imaging of the abdomen and pelvis was performed following the standard protocol before and following the bolus administration of intravenous contrast. CONTRAST:  163mL OMNIPAQUE IOHEXOL 350 MG/ML SOLN COMPARISON:  CT 12/08/2007 FINDINGS: Lower chest: Lung bases are clear. Hepatobiliary: Low-attenuation throughout the liver. Gallbladder normal. Pancreas: Pancreas is normal. No ductal dilatation. No pancreatic inflammation. Spleen: Normal spleen Adrenals/urinary tract: Adrenal glands mildly thickened. No nephrolithiasis or ureterolithiasis. No enhancing renal cortical lesion. Simple fluid attenuation lesion in the LEFT renal cortex measuring 10 mm (image 26/10). Delayed imaging demonstrates no filling defects in the collecting systems  ureters. No bladder calculi, enhancing bladder lesions, or filling defect within the bladder. Stomach/Bowel: Stomach, small bowel, appendix, and cecum are normal. There is a large ventral abdominal wall hernia which contains a long segment (15-20 cm) nonobstructed transverse colon. Rectosigmoid colon normal. Vascular/Lymphatic: Abdominal aorta is normal caliber. No periportal or retroperitoneal adenopathy. No pelvic adenopathy. Reproductive: Fiducial markers in the prostate gland. No acute findings. Other: ventral abdominal hernia sac measures 13 cm. The abdominal wall defect measures 2.8 cm. Musculoskeletal: No aggressive osseous lesion. IMPRESSION: 1. No explanation for hematuria. No nephrolithiasis, ureterolithiasis, enhancing renal cortical lesion, or filling defects within the collecting systems. 2. No bladder stones or filling defects in the bladder which does not excluded a bladder lesion. 3. Simple cyst of the LEFT kidney (Bosniak 1). 4. Large  ventral abdominal wall hernia contains a long segment of nonobstructed transverse colon. 5. Hepatic steatosis. Electronically Signed   By: Suzy Bouchard M.D.   On: 05/09/2021 14:22        Scheduled Meds:  alfuzosin  10 mg Oral Daily   amLODipine  5 mg Oral BID   carvedilol  12.5 mg Oral BID   insulin aspart  0-20 Units Subcutaneous TID WC   insulin aspart  0-5 Units Subcutaneous QHS   lisinopril  30 mg Oral Daily   loratadine  10 mg Oral Daily   pravastatin  80 mg Oral q1800   Continuous Infusions:  sodium chloride Stopped (05/09/21 1620)   sodium chloride 100 mL/hr at 05/10/21 0432   cefTRIAXone (ROCEPHIN)  IV 2 g (05/10/21 0853)     LOS: 1 day    Time spent: 41 minutes spent on chart review, discussion with nursing staff, consultants, updating family and interview/physical exam; more than 50% of that time was spent in counseling and/or coordination of care.    Ndrew Creason J [place] DO [facility] Available via Epic secure chat 7am-7pm After these hours, please refer to coverage provider listed on [email or web address] 05/10/2021, 9:34 AM

## 2021-05-11 ENCOUNTER — Inpatient Hospital Stay (HOSPITAL_COMMUNITY): Payer: 59 | Admitting: Certified Registered"

## 2021-05-11 ENCOUNTER — Encounter (HOSPITAL_COMMUNITY)
Admission: EM | Disposition: A | Discharge status: Home / Self Care | Payer: Self-pay | Source: Home / Self Care | Attending: Internal Medicine

## 2021-05-11 ENCOUNTER — Encounter (HOSPITAL_COMMUNITY): Payer: Self-pay | Admitting: Internal Medicine

## 2021-05-11 HISTORY — PX: CYSTOSCOPY WITH FULGERATION: SHX6638

## 2021-05-11 LAB — GLUCOSE, CAPILLARY
Glucose-Capillary: 143 mg/dL — ABNORMAL HIGH (ref 70–99)
Glucose-Capillary: 119 mg/dL — ABNORMAL HIGH (ref 70–99)
Glucose-Capillary: 144 mg/dL — ABNORMAL HIGH (ref 70–99)
Glucose-Capillary: 196 mg/dL — ABNORMAL HIGH (ref 70–99)
Glucose-Capillary: 129 mg/dL — ABNORMAL HIGH (ref 70–99)

## 2021-05-11 LAB — BASIC METABOLIC PANEL
Anion gap: 9 (ref 5–15)
BUN: 10 mg/dL (ref 6–20)
CO2: 21 mmol/L — ABNORMAL LOW (ref 22–32)
Calcium: 8.3 mg/dL — ABNORMAL LOW (ref 8.9–10.3)
Chloride: 107 mmol/L (ref 98–111)
Creatinine, Ser: 0.97 mg/dL (ref 0.61–1.24)
GFR, Estimated: >60 mL/min (ref >60)
Glucose, Bld: 149 mg/dL — ABNORMAL HIGH (ref 70–99)
Potassium: 3.6 mmol/L (ref 3.5–5.1)
Sodium: 137 mmol/L (ref 135–145)

## 2021-05-11 LAB — MAGNESIUM: Magnesium: 1.6 mg/dL — ABNORMAL LOW (ref 1.7–2.4)

## 2021-05-11 LAB — CBC
HCT: 27.7 % — ABNORMAL LOW (ref 39.0–52.0)
Hemoglobin: 8.7 g/dL — ABNORMAL LOW (ref 13.0–17.0)
MCH: 31.4 pg (ref 26.0–34.0)
MCHC: 31.4 g/dL (ref 30.0–36.0)
MCV: 100 fL (ref 80.0–100.0)
Platelets: 253 10*3/uL (ref 150–400)
RBC: 2.77 MIL/uL — ABNORMAL LOW (ref 4.22–5.81)
RDW: 17.8 % — ABNORMAL HIGH (ref 11.5–15.5)
WBC: 8.8 10*3/uL (ref 4.0–10.5)
nRBC: 0 % (ref 0.0–0.2)

## 2021-05-11 LAB — SURGICAL PCR SCREEN
MRSA, PCR: NEGATIVE
Staphylococcus aureus: NEGATIVE

## 2021-05-11 SURGERY — CYSTOSCOPY, WITH BLADDER FULGURATION
Anesthesia: General | Site: Bladder

## 2021-05-11 MED ORDER — FENTANYL CITRATE (PF) 100 MCG/2ML IJ SOLN
INTRAMUSCULAR | Status: AC
  Filled 2021-05-11: qty 2

## 2021-05-11 MED ORDER — ONDANSETRON HCL 4 MG/2ML IJ SOLN
INTRAMUSCULAR | Status: DC | PRN
  Administered 2021-05-11: 4 mg via INTRAVENOUS

## 2021-05-11 MED ORDER — SODIUM CHLORIDE 0.9 % IV SOLN: INTRAVENOUS | Status: DC | PRN

## 2021-05-11 MED ORDER — ONDANSETRON HCL 4 MG/2ML IJ SOLN
INTRAMUSCULAR | Status: AC
  Filled 2021-05-11: qty 2

## 2021-05-11 MED ORDER — DEXAMETHASONE SODIUM PHOSPHATE 10 MG/ML IJ SOLN
INTRAMUSCULAR | Status: AC
  Filled 2021-05-11: qty 1

## 2021-05-11 MED ORDER — SODIUM CHLORIDE 0.9 % IR SOLN
Status: DC | PRN
  Administered 2021-05-11 (×3): 3000 mL

## 2021-05-11 MED ORDER — FENTANYL CITRATE (PF) 100 MCG/2ML IJ SOLN
INTRAMUSCULAR | Status: DC | PRN
  Administered 2021-05-11 (×8): 50 ug via INTRAVENOUS

## 2021-05-11 MED ORDER — MAGNESIUM SULFATE 2 GM/50ML IV SOLN
2.0000 g | Freq: Once | INTRAVENOUS | Status: AC
Start: 2021-05-11 — End: 2021-05-11
  Administered 2021-05-11: 2 g via INTRAVENOUS
  Filled 2021-05-11: qty 50

## 2021-05-11 MED ORDER — LIDOCAINE HCL (PF) 2 % IJ SOLN
INTRAMUSCULAR | Status: AC
  Filled 2021-05-11: qty 5

## 2021-05-11 MED ORDER — PROMETHAZINE HCL 25 MG/ML IJ SOLN: 6.2500 mg | INTRAMUSCULAR | Status: DC | PRN

## 2021-05-11 MED ORDER — DEXAMETHASONE SODIUM PHOSPHATE 10 MG/ML IJ SOLN
INTRAMUSCULAR | Status: DC | PRN
  Administered 2021-05-11: 5 mg via INTRAVENOUS

## 2021-05-11 MED ORDER — MELATONIN 5 MG PO TABS
5.0000 mg | ORAL_TABLET | Freq: Once | ORAL | Status: AC
  Administered 2021-05-12: 5 mg via ORAL
  Filled 2021-05-11: qty 1

## 2021-05-11 MED ORDER — MIDAZOLAM HCL 2 MG/2ML IJ SOLN
INTRAMUSCULAR | Status: AC
  Filled 2021-05-11: qty 2

## 2021-05-11 MED ORDER — LIDOCAINE 2% (20 MG/ML) 5 ML SYRINGE
INTRAMUSCULAR | Status: DC | PRN
  Administered 2021-05-11: 80 mg via INTRAVENOUS

## 2021-05-11 MED ORDER — PHENYLEPHRINE 40 MCG/ML (10ML) SYRINGE FOR IV PUSH (FOR BLOOD PRESSURE SUPPORT)
PREFILLED_SYRINGE | INTRAVENOUS | Status: DC | PRN
  Administered 2021-05-11 (×3): 80 ug via INTRAVENOUS

## 2021-05-11 MED ORDER — PROPOFOL 10 MG/ML IV BOLUS
INTRAVENOUS | Status: DC | PRN
  Administered 2021-05-11: 200 mg via INTRAVENOUS

## 2021-05-11 MED ORDER — CHLORHEXIDINE GLUCONATE 0.12 % MT SOLN
15.0000 mL | Freq: Once | OROMUCOSAL | Status: AC
  Administered 2021-05-11: 15 mL via OROMUCOSAL

## 2021-05-11 MED ORDER — POTASSIUM CHLORIDE CRYS ER 20 MEQ PO TBCR
40.0000 meq | EXTENDED_RELEASE_TABLET | Freq: Once | ORAL | Status: AC
  Administered 2021-05-11: 40 meq via ORAL
  Filled 2021-05-11: qty 2

## 2021-05-11 MED ORDER — ACETAMINOPHEN 10 MG/ML IV SOLN: 1000.0000 mg | Freq: Once | INTRAVENOUS | Status: DC | PRN

## 2021-05-11 MED ORDER — LACTATED RINGERS IV SOLN: INTRAVENOUS | Status: DC

## 2021-05-11 MED ORDER — PROPOFOL 10 MG/ML IV BOLUS
INTRAVENOUS | Status: AC
  Filled 2021-05-11: qty 20

## 2021-05-11 MED ORDER — DEXMEDETOMIDINE (PRECEDEX) IN NS 20 MCG/5ML (4 MCG/ML) IV SYRINGE
PREFILLED_SYRINGE | INTRAVENOUS | Status: DC | PRN
  Administered 2021-05-11: 8 ug via INTRAVENOUS

## 2021-05-11 MED ORDER — SODIUM CHLORIDE 0.9 % IR SOLN
3000.0000 mL | Status: DC
  Administered 2021-05-11 – 2021-05-13 (×9): 3000 mL

## 2021-05-11 MED ORDER — FENTANYL CITRATE PF 50 MCG/ML IJ SOSY: 25.0000 ug | PREFILLED_SYRINGE | INTRAMUSCULAR | Status: DC | PRN

## 2021-05-11 SURGICAL SUPPLY — 14 items
BAG DRN RND TRDRP ANRFLXCHMBR (UROLOGICAL SUPPLIES) ×1
BAG URINE DRAIN 2000ML AR STRL (UROLOGICAL SUPPLIES) ×2 IMPLANT
BAG URO CATCHER STRL LF (MISCELLANEOUS) ×4 IMPLANT
CATH HEMA 3WAY 30CC 22FR COUDE (CATHETERS) ×2 IMPLANT
GLOVE SURG ENC MOIS LTX SZ6.5 (GLOVE) ×4 IMPLANT
GOWN STRL REUS W/TWL LRG LVL3 (GOWN DISPOSABLE) ×6 IMPLANT
KIT TURNOVER KIT A (KITS) ×2 IMPLANT
LOOP CUT BIPOLAR 24F LRG (ELECTROSURGICAL) ×2 IMPLANT
MANIFOLD NEPTUNE II (INSTRUMENTS) ×4 IMPLANT
PACK CYSTO (CUSTOM PROCEDURE TRAY) ×4 IMPLANT
SYR TOOMEY IRRIG 70ML (MISCELLANEOUS) ×3
SYRINGE TOOMEY IRRIG 70ML (MISCELLANEOUS) IMPLANT
TUBING CONNECTING 10 (TUBING) ×3 IMPLANT
TUBING CONNECTING 10' (TUBING) ×1

## 2021-05-11 NOTE — Interval H&P Note (Signed)
History and Physical Interval Note: Pt with clot urinary retention overnight.  Will proceed with urgent clot evacuation, cystoscopy and possible fulguration.   05/11/2021 8:54 AM  Jeremy Clark  has presented today for surgery, with the diagnosis of HEMATURIA, CLOT.  The various methods of treatment have been discussed with the patient and family. After consideration of risks, benefits and other options for treatment, the patient has consented to  Procedure(s): CYSTOSCOPY WITH CLOT EVACUATION (N/A) as a surgical intervention.  The patient's history has been reviewed, patient examined, no change in status, stable for surgery.  I have reviewed the patient's chart and labs.  Questions were answered to the patient's satisfaction.     Tamasha Laplante D Clary Boulais

## 2021-05-11 NOTE — Anesthesia Procedure Notes (Signed)
Procedure Name: LMA Insertion Date/Time: 05/11/2021 9:27 AM Performed by: Myna Bright, CRNA Pre-anesthesia Checklist: Patient identified, Emergency Drugs available, Suction available and Patient being monitored Patient Re-evaluated:Patient Re-evaluated prior to induction Oxygen Delivery Method: Circle system utilized Preoxygenation: Pre-oxygenation with 100% oxygen Induction Type: IV induction Ventilation: Mask ventilation without difficulty LMA: LMA with gastric port inserted LMA Size: 4.0 Number of attempts: 1 Placement Confirmation: positive ETCO2 and breath sounds checked- equal and bilateral Tube secured with: Tape Dental Injury: Teeth and Oropharynx as per pre-operative assessment

## 2021-05-11 NOTE — Progress Notes (Signed)
@  2230, pt complains alot of pain and pressure on his bladder, irrigated his catheter and large amount of clots was aspirated, After irrigation and turned back on CBI on moderate rate, pt complains again of pressure and pt states he's feeling to pass out, repositioned pt, stood at the bedside, CN at the beside and also irrigated the catheter and still no relief, Notified NP Olena Heckle and ordered Ditropan tablet 2.5 mg and also  paged on call urology Dr. [name] verbal order to stop the CBI. Will continue plan of care.

## 2021-05-11 NOTE — Anesthesia Postprocedure Evaluation (Signed)
Anesthesia Post Note  Patient: Jeremy Clark  Procedure(s) Performed: CYSTOSCOPY WITH CLOT EVACUATION (Bladder)     Patient location during evaluation: PACU Anesthesia Type: General Level of consciousness: awake and alert Pain management: pain level controlled Vital Signs Assessment: post-procedure vital signs reviewed and stable Respiratory status: spontaneous breathing, nonlabored ventilation, respiratory function stable and patient connected to nasal cannula oxygen Cardiovascular status: blood pressure returned to baseline and stable Postop Assessment: no apparent nausea or vomiting Anesthetic complications: no   No notable events documented.  Last Vitals:  Vitals:   05/11/21 1115 05/11/21 1139  BP:  (!) 153/77  Pulse: 96 90  Resp:  18  Temp:  36.8 C  SpO2: 99% 100%    Last Pain:  Vitals:   05/11/21 1151  TempSrc:   PainSc: 0-No pain                 Belenda Cruise P Johnmatthew Solorio

## 2021-05-11 NOTE — Anesthesia Preprocedure Evaluation (Addendum)
Anesthesia Evaluation  Patient identified by MRN, date of birth, ID band Patient awake    Reviewed: Allergy & Precautions, NPO status , Patient's Chart, lab work & pertinent test results  Airway Mallampati: III  TM Distance: >3 FB Neck ROM: Full    Dental  (+) Poor Dentition   Pulmonary neg pulmonary ROS, former smoker,    Pulmonary exam normal        Cardiovascular hypertension, Pt. on medications and Pt. on home beta blockers  Rhythm:Regular Rate:Normal     Neuro/Psych Anxiety CVA    GI/Hepatic negative GI ROS, Neg liver ROS,   Endo/Other  diabetes  Renal/GU negative Renal ROS   Hematuria with clot    Musculoskeletal   Abdominal Normal abdominal exam  (+)   Peds  Hematology  (+) anemia ,   Anesthesia Other Findings   Reproductive/Obstetrics                            Anesthesia Physical Anesthesia Plan  ASA: 3  Anesthesia Plan: General   Post-op Pain Management:    Induction: Intravenous  PONV Risk Score and Plan: 2 and Ondansetron, Dexamethasone, Treatment may vary due to age or medical condition and Midazolam  Airway Management Planned: Mask and LMA  Additional Equipment: None  Intra-op Plan:   Post-operative Plan: Extubation in OR  Informed Consent: I have reviewed the patients History and Physical, chart, labs and discussed the procedure including the risks, benefits and alternatives for the proposed anesthesia with the patient or authorized representative who has indicated his/her understanding and acceptance.     Dental advisory given  Plan Discussed with: CRNA  Anesthesia Plan Comments: (Lab Results      Component                Value               Date                      WBC                      8.8                 05/11/2021                HGB                      8.7 (L)             05/11/2021                HCT                      27.7 (L)             05/11/2021                MCV                      100.0               05/11/2021                PLT                      253  05/11/2021           Lab Results      Component                Value               Date                      NA                       137                 05/11/2021                K                        3.6                 05/11/2021                CO2                      21 (L)              05/11/2021                GLUCOSE                  149 (H)             05/11/2021                BUN                      10                  05/11/2021                CREATININE               0.97                05/11/2021                CALCIUM                  8.3 (L)             05/11/2021                GFRNONAA                 >60                 05/11/2021          )       Anesthesia Quick Evaluation

## 2021-05-11 NOTE — Transfer of Care (Signed)
Immediate Anesthesia Transfer of Care Note  Patient: Jeremy Clark  Procedure(s) Performed: CYSTOSCOPY WITH CLOT EVACUATION (Bladder)  Patient Location: PACU  Anesthesia Type:General  Level of Consciousness: awake, alert  and oriented  Airway & Oxygen Therapy: Patient Spontanous Breathing  Post-op Assessment: Report given to RN, Post -op Vital signs reviewed and stable and Patient moving all extremities  Post vital signs: Reviewed and stable  Last Vitals:  Vitals Value Taken Time  BP 138/84 05/11/21 1011  Temp    Pulse 100 05/11/21 1013  Resp 30 05/11/21 1013  SpO2 100 % 05/11/21 1013  Vitals shown include unvalidated device data.  Last Pain:  Vitals:   05/11/21 0850  TempSrc:   PainSc: 6       Patients Stated Pain Goal: 5 (37/94/32 7614)  Complications: No notable events documented.

## 2021-05-11 NOTE — Progress Notes (Signed)
Subjective: Pt complaining of suprapubic fullness and pain.  His catheter required extensive hand irrigation last night.    Objective: Vital signs in last 24 hours: Temp:  [98 F (36.7 C)-98.6 F (37 C)] 98 F (36.7 C) (12/13 0444) Pulse Rate:  [89-98] 98 (12/13 0444) Resp:  [18-20] 20 (12/13 0444) BP: (117-150)/(53-98) 119/53 (12/13 0444) SpO2:  [100 %] 100 % (12/13 0444)  Intake/Output from previous day: 12/12 0701 - 12/13 0700 In: 5002.8 [P.O.:600; I.V.:2202.8; IV Piggyback:100] Out: 8800 [Urine:8800]  Intake/Output this shift: No intake/output data recorded.  Physical Exam:  General: Alert and oriented CV: RRR, palpable distal pulses Lungs: CTAB, equal chest rise Abdomen: Obese, Soft, NTND, no rebound or guarding Gu: 20 F 3-way catheter in place and draining clotted blood Ext: NT, No erythema  Lab Results: Recent Labs    05/10/21 1156 05/10/21 1959 05/11/21 0500  HGB 8.0* 8.0* 8.7*  HCT 25.0* 24.7* 27.7*   BMET Recent Labs    05/10/21 0035 05/11/21 0500  NA 135 137  K 3.3* 3.6  CL 102 107  CO2 23 21*  GLUCOSE 138* 149*  BUN 11 10  CREATININE 0.91 0.97  CALCIUM 8.2* 8.3*     Studies/Results: DG Chest Port 1 View  Result Date: 05/09/2021 CLINICAL DATA:  Chest pain. EXAM: PORTABLE CHEST 1 VIEW COMPARISON:  September 18, 2020. FINDINGS: The heart size and mediastinal contours are within normal limits. Both lungs are clear. The visualized skeletal structures are unremarkable. IMPRESSION: No active disease. Electronically Signed   By: Marijo Conception M.D.   On: 05/09/2021 11:34   CT HEMATURIA WORKUP  Result Date: 05/09/2021 CLINICAL DATA:  Gross hematuria. EXAM: CT ABDOMEN AND PELVIS WITHOUT AND WITH CONTRAST TECHNIQUE: Multidetector CT imaging of the abdomen and pelvis was performed following the standard protocol before and following the bolus administration of intravenous contrast. CONTRAST:  134mL OMNIPAQUE IOHEXOL 350 MG/ML SOLN COMPARISON:  CT  12/08/2007 FINDINGS: Lower chest: Lung bases are clear. Hepatobiliary: Low-attenuation throughout the liver. Gallbladder normal. Pancreas: Pancreas is normal. No ductal dilatation. No pancreatic inflammation. Spleen: Normal spleen Adrenals/urinary tract: Adrenal glands mildly thickened. No nephrolithiasis or ureterolithiasis. No enhancing renal cortical lesion. Simple fluid attenuation lesion in the LEFT renal cortex measuring 10 mm (image 26/10). Delayed imaging demonstrates no filling defects in the collecting systems ureters. No bladder calculi, enhancing bladder lesions, or filling defect within the bladder. Stomach/Bowel: Stomach, small bowel, appendix, and cecum are normal. There is a large ventral abdominal wall hernia which contains a long segment (15-20 cm) nonobstructed transverse colon. Rectosigmoid colon normal. Vascular/Lymphatic: Abdominal aorta is normal caliber. No periportal or retroperitoneal adenopathy. No pelvic adenopathy. Reproductive: Fiducial markers in the prostate gland. No acute findings. Other: ventral abdominal hernia sac measures 13 cm. The abdominal wall defect measures 2.8 cm. Musculoskeletal: No aggressive osseous lesion. IMPRESSION: 1. No explanation for hematuria. No nephrolithiasis, ureterolithiasis, enhancing renal cortical lesion, or filling defects within the collecting systems. 2. No bladder stones or filling defects in the bladder which does not excluded a bladder lesion. 3. Simple cyst of the LEFT kidney (Bosniak 1). 4. Large ventral abdominal wall hernia contains a long segment of nonobstructed transverse colon. 5. Hepatic steatosis. Electronically Signed   By: Suzy Bouchard M.D.   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  Hand irrigate Foley catheter as needed for low urine output/increased clot burden.  Okay to exchange current catheter with 24 [place] hematuria catheter if his current catheter becomes obstructed. -The patient is tentatively plan for surgery around 1230 today.   LOS: 2 days   Ellison Hughs, MD [facility] Pager: [phone]  05/11/2021, 7:36 AM

## 2021-05-11 NOTE — Progress Notes (Signed)
PROGRESS NOTE    Jeremy Clark  JJO:[id number] DOB: September 26, 1965 DOA: 05/09/2021 PCP: [name], MD    Brief Narrative:  Jeremy Clark is a 55 year old male with past medical history significant for type 2 diabetes mellitus, history of CVA, prostate cancer, essential hypertension, hyperlipidemia, morbid obesity who presented to [facility] ED on 12/11 with recurrent hematuria.  Patient reports symptoms initially started about 2-3 weeks ago and has been followed by urology, Dr. [name].  Patient reports had a Foley catheter placed previously in the clinic with irrigation with resolution of hematuria.  Patient was told he was going to have a cystoscopy in the near future but has yet to be scheduled.  Patient denies any pain with urination.  Patient now reports some shortness of breath, fatigue and dizziness.  Upon checking his pulse oximetry at home, he noticed his heart rate was in the 150s so he decided come to the ED for further evaluation.  Patient denies any other anticoagulants.  In the ED, temperature 98.1 F, HR 136, RR 18, BP 163/88, SPO2 100% on room air.  Sodium 139, potassium 3.8, chloride 106, CO2 19, glucose 167, BUN 9, creatinine 0.96.  AST 32, ALT 20, total bilirubin 0.6.  WBC 5.7, hemoglobin 7.8, platelets 307.  Lactic acid 4.8.  Urinalysis turbid, red in color, many bacteria, greater than 50 WBCs.  COVID-19 PCR negative.  Influenza A/B PCR negative.  Blood cultures x2 ordered.  Urine culture ordered.  CT hematuria scan 12/11 withno explanation for hematuria, no nephrolithiasis/ureterolithiasis, no enhancing renal cortical lesion or filling defects within the collecting system; no bladder stones/filling defects in the bladder. EDP discussed case with urology on-call, Dr. Tresa Thornberry who believes this is likely related to radiation cystitis and recommended holding Plavix and starting ceftriaxone.  Hospital service was consulted for further evaluation management of symptomatic anemia with lactic  acidosis secondary to gross hematuria.     Assessment & Plan:   Principal Problem:   Hematuria, gross Active Problems:   DM2 (diabetes mellitus, type 2) (HCC)   Essential hypertension   Hyperlipidemia   Abdominal wall hernia   Prostate cancer (HCC)   GAD (generalized anxiety disorder)   Lactic acidosis   Symptomatic anemia   History of CVA (cerebrovascular accident)   Symptomatic anemia 2/2 gross hematuria Patient presenting to ED with shortness of breath, tachycardia, weakness and dizziness with recurrent gross hematuria.  Patient follows with urology outpatient, Dr. [name].  Patient's hemoglobin was noted to be 7.9 on admission with a baseline 14.2 January 16, 2021.  Patient denies any discomfort on urination.  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  1 cystoscopy with clot evacuation, fulguration and urethral dilation by urology, Dr. [name] 05/11/2021. --Urology following --Hgb 7.8>7.5>7.9>8.0>8.7 --s/p 1u pRBC 12/11 --Ceftriaxone 2g IV q24h --Holding home Plavix --Continues on CBI --CBC daily  Lactic acidosis: Resolved Lactic acid elevated 4.2 on admission, etiology likely secondary to blood loss anemia with dehydration.  Patient is afebrile without leukocytosis.  Procalcitonin within normal limits.  Sepsis ruled out.  Treated with 3L IVF bolus with normalization of lactic acid.  Urine culture with no growth --Lactic acid 4.2>>1.9 --Blood cultures x2: No growth x2 days  Hypokalemia Potassium 3.6 this morning, will replete. --Repeat electrolytes in a.m. to include magnesium  Type 2 diabetes mellitus Hemoglobin A1c 4.8, well controlled.  On Ozempic 2 mg injected weekly, and dapagliflozin/metformin 5-1000mg  BID at home. --Hold home medications while inpatient --moderate SSI for coverage  --  CBGs qAC/HS  Essential hypertension --Amlodipine 5 mg p.o. twice daily --Carvedilol 12.5 mg p.o. twice daily --Lisinopril 30 mg p.o.  daily  Hyperlipidemia: Pravastatin 80 mg p.o. daily  Hx prostate cancer Follows with urology outpatient, Dr. [name].  Completed radiation therapy May 2022. --Alfuzosin 10 mg p.o. daily  Hx CVA --Holding home Plavix  Ventral abdominal hernia Patient with large ventral hernia that is easily reducible on physical exam.  CT hematuria scan with large ventral abdominal wall hernia containing a long segment of nonobstructive transverse colon.  Morbid obesity Body mass index is 53.05 kg/m.  Discussed with patient needs for aggressive lifestyle changes/weight loss as this complicates all facets of care.  Outpatient follow-up with PCP.  May benefit from bariatric evaluation outpatient.     DVT prophylaxis: SCDs Start: 05/09/21 1756   Code Status: Full Code Family Communication: No family present at bedside this morning  Disposition Plan:  Level of care: Telemetry Status is: Inpatient  Remains inpatient appropriate because: Pending urology evaluation, will likely need cystoscopy, IV antibiotics, needs further monitoring of hemoglobin given inappropriate rise following transfusion.     Consultants:  Urology, Dr. [name]  Procedures:  None  Antimicrobials:  Ceftriaxone 12/11>>   Subjective: Patient seen examined bedside, resting comfortably.  Just returned from cystoscopy with clot evacuation, fulguration and urethral dilation.  Continues on continuous bladder irrigation.  Requesting to go to the restroom, and when he thinks he will be discharged home.  Complaining of some discomfort to his urethral catheter site, otherwise no other specific complaints at this time.  No other questions or concerns at this time.  Denies headache, no dizziness, no chest pain, no shortness of breath, no abdominal pain, no weakness, no fatigue, no fever/chills/night sweats, no nausea/vomiting/diarrhea, no paresthesias.  No acute events overnight per nursing staff.  Objective: Vitals:   05/11/21 1030  05/11/21 1045 05/11/21 1115 05/11/21 1139  BP: (!) 147/81 (!) 147/80  (!) 153/77  Pulse: 93 93 96 90  Resp: (!) 30 (!) 31  18  Temp:  98 F (36.7 C)  98.3 F (36.8 C)  TempSrc:    Oral  SpO2: 99% 97% 99% 100%  Weight:      Height:        Intake/Output Summary (Last 24 hours) at 05/11/2021 1319 Last data filed at 05/11/2021 1251 Gross per 24 hour  Intake 5112.81 ml  Output 16950 ml  Net -11837.19 ml   Filed Weights   05/09/21 1109 05/11/21 0850  Weight: (!) 140.2 kg (!) 140.2 kg    Examination:  General exam: Appears calm and comfortable, morbidly obese Respiratory system: Clear to auscultation. Respiratory effort normal.  On room air Cardiovascular system: S1 & S2 heard, RRR. No JVD, murmurs, rubs, gallops or clicks. No pedal edema. Gastrointestinal system: Abdomen is nondistended, soft and nontender.  Noted large abdominal ventral hernia which is easily reducible, no organomegaly or masses felt. Normal bowel sounds heard. GU: Foley catheter noted undergoing CBI urine slightly pink-tinged in color Central nervous system: Alert and oriented. No focal neurological deficits. Extremities: Symmetric 5 x 5 power. Skin: No rashes, lesions or ulcers Psychiatry: Judgement and insight appear normal. Mood & affect appropriate.     Data Reviewed: I have personally reviewed following labs and imaging studies  CBC: Recent Labs  Lab 05/09/21 1042 05/09/21 1453 05/09/21 1856 05/10/21 0035 05/10/21 1156 05/10/21 1959 05/11/21 0500  WBC 5.7 6.0  --  6.2  --   --  8.8  NEUTROABS 4.7  4.4  --   --   --   --   --   HGB 7.8* 7.5* 7.5* 7.9*   7.9* 8.0* 8.0* 8.7*  HCT 24.6* 23.4* 24.1* 25.0*   24.7* 25.0* 24.7* 27.7*  MCV 98.8 97.5  --  95.4  --   --  100.0  PLT 307 286  --  277  --   --  629   Basic Metabolic Panel: Recent Labs  Lab 05/09/21 1042 05/10/21 0035 05/11/21 0500  NA 139 135 137  K 3.8 3.3* 3.6  CL 106 102 107  CO2 19* 23 21*  GLUCOSE 167* 138* 149*  BUN 9 11  10   CREATININE 0.96 0.91 0.97  CALCIUM 8.3* 8.2* 8.3*  MG  --   --  1.6*   GFR: Estimated Creatinine Clearance: 111.5 mL/min (by C-G formula based on SCr of 0.97 mg/dL). Liver Function Tests: Recent Labs  Lab 05/09/21 1042 05/10/21 0035  AST 32 22  ALT 20 17  ALKPHOS 63 67  BILITOT 0.6 0.8  PROT 7.1 6.3*  ALBUMIN 3.5 3.1*   No results for input(s): LIPASE, AMYLASE in the last 168 hours. No results for input(s): AMMONIA in the last 168 hours. Coagulation Profile: Recent Labs  Lab 05/09/21 1042 05/10/21 0035  INR 1.1 1.1   Cardiac Enzymes: No results for input(s): CKTOTAL, CKMB, CKMBINDEX, TROPONINI in the last 168 hours. BNP (last 3 results) No results for input(s): PROBNP in the last 8760 hours. HbA1C: Recent Labs    05/10/21 0035  HGBA1C 4.8   CBG: Recent Labs  Lab 05/10/21 1702 05/10/21 2119 05/11/21 0735 05/11/21 1040 05/11/21 1204  GLUCAP 128* 141* 143* 119* 144*   Lipid Profile: No results for input(s): CHOL, HDL, LDLCALC, TRIG, CHOLHDL, LDLDIRECT in the last 72 hours. Thyroid Function Tests: No results for input(s): TSH, T4TOTAL, FREET4, T3FREE, THYROIDAB in the last 72 hours. Anemia Panel: No results for input(s): VITAMINB12, FOLATE, FERRITIN, TIBC, IRON, RETICCTPCT in the last 72 hours. Sepsis Labs: Recent Labs  Lab 05/09/21 1459 05/09/21 1630 05/09/21 1856 05/10/21 0035  PROCALCITON  --   --   --  <0.10  LATICACIDVEN 4.2* 3.7* 3.5* 1.9    Recent Results (from the past 240 hour(s))  Blood Culture (routine x 2)     Status: None (Preliminary result)   Collection Time: 05/09/21 10:42 AM   Specimen: BLOOD  Result Value Ref Range Status   Specimen Description   Final    BLOOD LEFT ANTECUBITAL Performed at [facility], [place] [address] [place], [place] [address]    Special Requests   Final    BOTTLES DRAWN AEROBIC AND ANAEROBIC Blood Culture adequate volume Performed at [facility] [address] [place], [place] [address]    Culture   Final    NO GROWTH 2 DAYS Performed at [facility] [address], [place], [place] [address]    Report Status PENDING  Incomplete  Urine Culture     Status: None   Collection Time: 05/09/21 10:42 AM   Specimen: In/Out Cath Urine  Result Value Ref Range Status   Specimen Description   Final    IN/OUT CATH URINE Performed at [place] [address] [place], [place] [address]    Special Requests   Final    NONE Performed at [facility], [place] [address], [place],  [address]    Culture   Final  NO GROWTH Performed at [facility], [place] [address] [place], [place] [address]    Report Status 05/10/2021 FINAL  Final  Blood Culture (routine x 2)     Status: None (Preliminary result)   Collection Time: 05/09/21 10:47 AM   Specimen: BLOOD LEFT FOREARM  Result Value Ref Range Status   Specimen Description   Final    BLOOD LEFT FOREARM Performed at [facility] [address], [place], [place] [address]    Special Requests   Final    BOTTLES DRAWN AEROBIC AND ANAEROBIC Blood Culture adequate volume Performed at [facility] [address], [place], [place] [address]    Culture   Final    NO GROWTH 2 DAYS Performed at [facility] [address], [place], [place] [address]    Report Status PENDING  Incomplete  Resp Panel by RT-PCR (Flu A&B, Covid) Nasopharyngeal Swab     Status: None   Collection Time: 05/09/21  2:48 PM   Specimen: Nasopharyngeal Swab; Nasopharyngeal(NP) swabs in vial transport medium  Result Value Ref Range Status   SARS Coronavirus 2 by RT PCR NEGATIVE NEGATIVE Final    Comment: (NOTE) SARS-CoV-2 target nucleic acids are NOT DETECTED.  The SARS-CoV-2 RNA is generally detectable in upper respiratory specimens during the acute phase of infection. The lowest concentration of SARS-CoV-2 viral copies this assay  can detect is 138 copies/mL. A negative result does not preclude SARS-Cov-2 infection and should not be used as the sole basis for treatment or other patient management decisions. A negative result may occur with  improper specimen collection/handling, submission of specimen other than nasopharyngeal swab, presence of viral mutation(s) within the areas targeted by this assay, and inadequate number of viral copies(<138 copies/mL). A negative result must be combined with clinical observations, patient history, and epidemiological information. The expected result is Negative.  Fact Sheet for Patients:  EntrepreneurPulse.com.au  Fact Sheet for Healthcare Providers:  IncredibleEmployment.be  This test is no t yet approved or cleared by the [place] FDA and  has been authorized for detection and/or diagnosis of SARS-CoV-2 by FDA under an Emergency Use Authorization (EUA). This EUA will remain  in effect (meaning this test can be used) for the duration of the COVID-19 declaration under Section 564(b)(1) of the Act, 21 U.S.C.section 360bbb-3(b)(1), unless the authorization is terminated  or revoked sooner.       Influenza A by PCR NEGATIVE NEGATIVE Final   Influenza B by PCR NEGATIVE NEGATIVE Final    Comment: (NOTE) The Xpert Xpress SARS-CoV-2/FLU/RSV plus assay is intended as an aid in the diagnosis of influenza from Nasopharyngeal swab specimens and should not be used as a sole basis for treatment. Nasal washings and aspirates are unacceptable for Xpert Xpress SARS-CoV-2/FLU/RSV testing.  Fact Sheet for Patients: EntrepreneurPulse.com.au  Fact Sheet for Healthcare Providers: IncredibleEmployment.be  This test is not yet approved or cleared by the [place] FDA and has been authorized for detection and/or diagnosis of SARS-CoV-2 by FDA under an Emergency Use Authorization (EUA). This EUA will  remain in effect (meaning this test can be used) for the duration of the COVID-19 declaration under Section 564(b)(1) of the Act, 21 U.S.C. section 360bbb-3(b)(1), unless the authorization is terminated or revoked.  Performed at [facility], [place] [address] [place], [place] [address]   Surgical pcr screen     Status: None   Collection Time: 05/11/21  8:35 AM   Specimen: Nasal  Mucosa; Nasal Swab  Result Value Ref Range Status   MRSA, PCR NEGATIVE NEGATIVE Final   Staphylococcus aureus NEGATIVE NEGATIVE Final    Comment: (NOTE) The Xpert SA Assay (FDA approved for NASAL specimens in patients 68 years of age and older), is one component of a comprehensive surveillance program. It is not intended to diagnose infection nor to guide or monitor treatment. Performed at [facility], [place] [address] [place], [place] [address]          Radiology Studies: CT HEMATURIA WORKUP  Result Date: 05/09/2021 CLINICAL DATA:  Gross hematuria. EXAM: CT ABDOMEN AND PELVIS WITHOUT AND WITH CONTRAST TECHNIQUE: Multidetector CT imaging of the abdomen and pelvis was performed following the standard protocol before and following the bolus administration of intravenous contrast. CONTRAST:  117mL OMNIPAQUE IOHEXOL 350 MG/ML SOLN COMPARISON:  CT 12/08/2007 FINDINGS: Lower chest: Lung bases are clear. Hepatobiliary: Low-attenuation throughout the liver. Gallbladder normal. Pancreas: Pancreas is normal. No ductal dilatation. No pancreatic inflammation. Spleen: Normal spleen Adrenals/urinary tract: Adrenal glands mildly thickened. No nephrolithiasis or ureterolithiasis. No enhancing renal cortical lesion. Simple fluid attenuation lesion in the LEFT renal cortex measuring 10 mm (image 26/10). Delayed imaging demonstrates no filling defects in the collecting systems ureters. No bladder calculi, enhancing bladder lesions, or filling defect within the bladder. Stomach/Bowel: Stomach,  small bowel, appendix, and cecum are normal. There is a large ventral abdominal wall hernia which contains a long segment (15-20 cm) nonobstructed transverse colon. Rectosigmoid colon normal. Vascular/Lymphatic: Abdominal aorta is normal caliber. No periportal or retroperitoneal adenopathy. No pelvic adenopathy. Reproductive: Fiducial markers in the prostate gland. No acute findings. Other: ventral abdominal hernia sac measures 13 cm. The abdominal wall defect measures 2.8 cm. Musculoskeletal: No aggressive osseous lesion. IMPRESSION: 1. No explanation for hematuria. No nephrolithiasis, ureterolithiasis, enhancing renal cortical lesion, or filling defects within the collecting systems. 2. No bladder stones or filling defects in the bladder which does not excluded a bladder lesion. 3. Simple cyst of the LEFT kidney (Bosniak 1). 4. Large ventral abdominal wall hernia contains a long segment of nonobstructed transverse colon. 5. Hepatic steatosis. Electronically Signed   By: Suzy Bouchard M.D.   On: 05/09/2021 14:22        Scheduled Meds:  alfuzosin  10 mg Oral Daily   amLODipine  5 mg Oral BID   carvedilol  12.5 mg Oral BID   Chlorhexidine Gluconate Cloth  6 each Topical Daily   insulin aspart  0-15 Units Subcutaneous TID WC   insulin aspart  0-5 Units Subcutaneous QHS   lisinopril  30 mg Oral Daily   loratadine  10 mg Oral Daily   pravastatin  80 mg Oral q1800   Continuous Infusions:  sodium chloride Stopped (05/09/21 1620)   sodium chloride 75 mL/hr at 05/11/21 0529   cefTRIAXone (ROCEPHIN)  IV 2 g (05/10/21 0853)   magnesium sulfate bolus IVPB       LOS: 2 days    Time spent: 39 minutes spent on chart review, discussion with nursing staff, consultants, updating family and interview/physical exam; more than 50% of that time was spent in counseling and/or coordination of care.    Nesta Kimple J [place] DO [facility] Available via Epic secure chat 7am-7pm After these hours, please  refer to coverage provider listed on [email or web address] 05/11/2021, 1:19 PM

## 2021-05-11 NOTE — Op Note (Signed)
Operative Note  Preoperative diagnosis:  1.  Gross hematuria 2.  Clot urinary retention  Postoperative diagnosis: 1.  Gross hematuria 2.  Clot urinary retention 3.  Radiation cystitis/prostate neck bleeding  Procedure(s): 1.  Cystoscopy, clot evacuation, fulguration 2.  Urethral dilaton  Surgeon: Jacalyn Lefevre, MD  Assistants:  None  Anesthesia:  General  Complications:  None  EBL:  25 cc  Specimens: 1. none  Drains/Catheters: 1.  22Fr 3-way hematuria foley catheter with continuous bladder irrigation  Intraoperative findings:   Normal anterior urethra Bilateral orthotopic Uos Slightly erythematous bladder mucosa posterior to trigone Bladder neck/prostate bleeding Fixed prostatic urethra Difficult to move scope due to length of prostate and bladder neck fixed; unable to examine dome/posterior wall/anterior wall  Indication:  Jeremy Clark is a 55 y.o. male with history of prostate cancer s/p radiation as well as CVA on plavix presented with gross hematuria.  Continuous bladder irrigation was started and patient developed clot urinary retention overnight.   Description of procedure: After risks and benefits of the procedure were discussed with the patient informed consent was obtained.  The patient taken the operating placed in supine position.  Anesthesia was induced and antibiotics were ministered.  The patient was then repositioned in the dorsolithotomy position.  He was prepped and draped in usual sterile fashion.  Timeout was performed.  Urethral sounds were used to dilate the urethra meatus from 22 [place] to 28 [place].  Next the resectoscope was placed in the urethral meatus using the visual obturator and advanced in the bladder.  Due to the length of the prostatic urethra the resectoscope was held in the bladder neck was fixed.  This made moving the resectoscope very difficult.  In addition patient's bladder did not hold urine well and easily spasm.  The bipolar  loop was then used to fulgurate the erythematous mucosa seen posterior to the trigone as well as the bladder neck.  I was unable to get a clear look at the dome, posterior walls or anterior lateral walls.  Hemostasis was adequate with irrigant turned off.  Due to bleeding at anterior bladder neck the resectoscope was removed.  18 French three-way hematuria Foley catheter was placed.  30 cc of sterile water placed in the balloon and the catheter was placed on traction.  Continuous bladder irrigation was initiated with normal saline.  The patient emerged from anesthesia and transferred back in stable condition.   Plan: Continue continuous bladder irrigation and wean as tolerated.

## 2021-05-11 NOTE — Plan of Care (Signed)
  Problem: Pain Managment: Goal: General experience of comfort will improve Outcome: Progressing   

## 2021-05-12 ENCOUNTER — Encounter (HOSPITAL_COMMUNITY): Payer: Self-pay | Admitting: Urology

## 2021-05-12 DIAGNOSIS — Z8673 Personal history of transient ischemic attack (TIA), and cerebral infarction without residual deficits: Secondary | ICD-10-CM

## 2021-05-12 DIAGNOSIS — K439 Ventral hernia without obstruction or gangrene: Secondary | ICD-10-CM

## 2021-05-12 DIAGNOSIS — E785 Hyperlipidemia, unspecified: Secondary | ICD-10-CM

## 2021-05-12 DIAGNOSIS — C61 Malignant neoplasm of prostate: Secondary | ICD-10-CM

## 2021-05-12 DIAGNOSIS — F411 Generalized anxiety disorder: Secondary | ICD-10-CM

## 2021-05-12 DIAGNOSIS — E119 Type 2 diabetes mellitus without complications: Secondary | ICD-10-CM

## 2021-05-12 DIAGNOSIS — I10 Essential (primary) hypertension: Secondary | ICD-10-CM

## 2021-05-12 DIAGNOSIS — D649 Anemia, unspecified: Secondary | ICD-10-CM

## 2021-05-12 DIAGNOSIS — R310 Gross hematuria: Secondary | ICD-10-CM

## 2021-05-12 DIAGNOSIS — E872 Acidosis, unspecified: Secondary | ICD-10-CM

## 2021-05-12 LAB — CBC
HCT: 23.6 % — ABNORMAL LOW (ref 39.0–52.0)
Hemoglobin: 7.6 g/dL — ABNORMAL LOW (ref 13.0–17.0)
MCH: 31.3 pg (ref 26.0–34.0)
MCHC: 32.2 g/dL (ref 30.0–36.0)
MCV: 97.1 fL (ref 80.0–100.0)
Platelets: 187 10*3/uL (ref 150–400)
RBC: 2.43 MIL/uL — ABNORMAL LOW (ref 4.22–5.81)
RDW: 18.1 % — ABNORMAL HIGH (ref 11.5–15.5)
WBC: 10 10*3/uL (ref 4.0–10.5)
nRBC: 0.5 % — ABNORMAL HIGH (ref 0.0–0.2)

## 2021-05-12 LAB — MAGNESIUM: Magnesium: 2 mg/dL (ref 1.7–2.4)

## 2021-05-12 LAB — BASIC METABOLIC PANEL
Anion gap: 11 (ref 5–15)
BUN: 10 mg/dL (ref 6–20)
CO2: 17 mmol/L — ABNORMAL LOW (ref 22–32)
Calcium: 8.3 mg/dL — ABNORMAL LOW (ref 8.9–10.3)
Chloride: 110 mmol/L (ref 98–111)
Creatinine, Ser: 0.97 mg/dL (ref 0.61–1.24)
GFR, Estimated: >60 mL/min (ref >60)
Glucose, Bld: 122 mg/dL — ABNORMAL HIGH (ref 70–99)
Potassium: 4.6 mmol/L (ref 3.5–5.1)
Sodium: 138 mmol/L (ref 135–145)

## 2021-05-12 LAB — GLUCOSE, CAPILLARY
Glucose-Capillary: 160 mg/dL — ABNORMAL HIGH (ref 70–99)
Glucose-Capillary: 137 mg/dL — ABNORMAL HIGH (ref 70–99)
Glucose-Capillary: 126 mg/dL — ABNORMAL HIGH (ref 70–99)
Glucose-Capillary: 179 mg/dL — ABNORMAL HIGH (ref 70–99)

## 2021-05-12 MED ORDER — POLYETHYLENE GLYCOL 3350 17 G PO PACK
17.0000 g | PACK | Freq: Every day | ORAL | Status: DC
  Administered 2021-05-12 – 2021-05-17 (×6): 17 g via ORAL
  Filled 2021-05-12 (×6): qty 1

## 2021-05-12 MED ORDER — DOCUSATE SODIUM 100 MG PO CAPS
100.0000 mg | ORAL_CAPSULE | Freq: Two times a day (BID) | ORAL | Status: DC
  Administered 2021-05-12 – 2021-05-17 (×10): 100 mg via ORAL
  Filled 2021-05-12 (×11): qty 1

## 2021-05-12 NOTE — Progress Notes (Signed)
1 Day Post-Op Subjective: Pt resting comfortably in bed with no complaints.    Objective: Vital signs in last 24 hours: Temp:  [97.7 F (36.5 C)-99.5 F (37.5 C)] 98.3 F (36.8 C) (12/14 1133) Pulse Rate:  [82-95] 84 (12/14 1133) Resp:  [16-22] 18 (12/14 1133) BP: (102-152)/(61-80) 102/61 (12/14 1133) SpO2:  [100 %] 100 % (12/14 1133)  Intake/Output from previous day: 12/13 0701 - 12/14 0700 In: 15685.5 [I.V.:535.5; IV Piggyback:150] Out: 20813 [Urine:33475; Blood:50]  Intake/Output this shift: Total I/O In: 9240 [P.O.:240; Other:9000] Out: 6500 [Urine:6500]  Physical Exam:  General: Alert and oriented CV: RRR, palpable distal pulses Lungs: CTAB, equal chest rise Abdomen: Obese, Soft, NTND, no rebound or guarding Gu: 24 F 3-way Foley in place and draining light pink urine with CBI Ext: NT, No erythema  Lab Results: Recent Labs    05/10/21 1959 05/11/21 0500 05/12/21 0509  HGB 8.0* 8.7* 7.6*  HCT 24.7* 27.7* 23.6*   BMET Recent Labs    05/11/21 0500 05/12/21 0509  NA 137 138  K 3.6 4.6  CL 107 110  CO2 21* 17*  GLUCOSE 149* 122*  BUN 10 10  CREATININE 0.97 0.97  CALCIUM 8.3* 8.3*     Studies/Results: No results found.  Assessment/Plan: 55 year old male with a history of prostate cancer, s/p XRT in 2021/2022 with persistent gross hematuria.  POD1 s/p cystoscopy with clot evacuation with Dr. [name]  -Appearance of urine is markedly improved following surgery with Dr. [name]. H/H stable. -Continue CBI until tomorrow AM.  If urine remains clear, tomorrow, will attempt a voiding trial   LOS: 3 days   Ellison Hughs, MD [facility] Pager: [phone]  05/12/2021, 12:59 PM

## 2021-05-12 NOTE — Progress Notes (Signed)
PROGRESS NOTE    Jeremy Clark  ZOX:[id number] DOB: [birth year] DOA: 05/09/2021 PCP: [name], MD    Brief Narrative:   Jeremy Clark is a 55 year old male with past medical history of type 2 diabetes, history of CVA, prostate cancer, hypertension, hyperlipidemia, morbid obesity presented to hospital on 05/09/2021 with complaints of recurrent hematuria.  Patient has been followed by urology as outpatient and had been on Foley catheter previously.  At home he was also noted to have elevated heart rate.  In the ED, patient was slightly hypertensive, tachycardic.  Creatinine was 0.9.  Hemoglobin was 7.8.  Urine analysis was abnormal.  COVID-19 PCR negative.  Influenza A/B PCR negative.    CT renal scan 12/11 with no explanation for hematuria,.  Urology was consulted from the ED who had an impression of radiation cystitis and recommended holding Plavix and starting ceftriaxone.  Patient was then admitted hospital for further evaluation and treatment.      Assessment & Plan:   Principal Problem:   Hematuria, gross Active Problems:   DM2 (diabetes mellitus, type 2) (HCC)   Essential hypertension   Hyperlipidemia   Abdominal wall hernia   Prostate cancer (HCC)   GAD (generalized anxiety disorder)   Lactic acidosis   Symptomatic anemia   History of CVA (cerebrovascular accident)  Symptomatic anemia 2/2 gross hematuria Patient had shortness of breath tachycardia weakness dizziness on presentation.  Initial hemoglobin of 7.9 with baseline of 14.2.  Patient received PRBC transfusion during hospitalization.  Plavix was discontinued.  Urology followed the patient.  Patient underwent cystoscopy on 05/12/2021 and is currently on CBI.  Follow urology recommendation.  Transfuse hemoglobin as needed.  Latest hemoglobin of 7.6.  Urology recommends continuation of CBI at this time.  Tentative voiding trial tomorrow if urine clears.  Lactic acidosis: Resolved Lactic acid was elevated at 4.2.   Thought to be secondary to blood loss.  Patient received 3 L of IV fluid bolus and has received PRBC as well.  Lactate has resolved at this time.  Blood cultures negative so far  Hypokalemia Improved after replacement.  Type 2 diabetes mellitus Latest hemoglobin A1c of 4.8.  On Ozempic 2 mg injected weekly, and dapagliflozin/metformin 5-1000mg  BID at home.  Continue sliding scale insulin while in hospital.  Essential hypertension Continue amlodipine, Coreg, lisinopril.  Continue to monitor blood pressure.  Blood pressure seems to be stable.  Hyperlipidemia:  Continue statins.    Hx prostate cancer Follows with urology outpatient, Dr. [name].  Completed radiation therapy May 2022.  Continue Alfuzosin 10 mg p.o. daily  Hx CVA To hold Plavix for now due to hematuria.  Ventral abdominal hernia  CT hematuria scan with large ventral abdominal wall hernia containing a long segment of nonobstructive transverse colon.  Supportive care for now.  Morbid obesity Body mass index is 53.05 kg/m.  Would benefit from weight loss as outpatient/bariatric evaluation.   DVT prophylaxis: SCDs Start: 05/09/21 1756    Code Status: Full Code  Family Communication:  Spoke with the patient at bedside.  Disposition Plan: Home  Status is: Inpatient  Remains inpatient appropriate because: Status post cystoscopy with continuous bladder irrigation,    Consultants:  Urology  Procedures:  Cystoscopy on 05/11/2021 with continuous bladder irrigation PRBC transfusion  Antimicrobials:  Ceftriaxone 12/11>>   Subjective: Today, patient was seen and examined at bedside.  Patient feels mild lower abdominal discomfort but no fever chills nausea vomiting.  Has had a bowel movement yesterday.  Objective: Vitals:   05/11/21 1950 05/11/21 2351 05/12/21 0445 05/12/21 0735  BP: 123/78 (!) 152/80 137/73 128/70  Pulse: 92 91 92 82  Resp: (!) 22  20 20   Temp: 98.7 F (37.1 C) 99.5 F (37.5 C) 98.2 F  (36.8 C) 97.9 F (36.6 C)  TempSrc: Oral Oral Oral Oral  SpO2: 100% 100% 100% 100%  Weight:      Height:        Intake/Output Summary (Last 24 hours) at 05/12/2021 0828 Last data filed at 05/12/2021 0827 Gross per 24 hour  Intake 15685.51 ml  Output 34275 ml  Net -18589.49 ml    Filed Weights   05/09/21 1109 05/11/21 0850  Weight: (!) 140.2 kg (!) 140.2 kg    Physical examination: General: Obese not in obvious distress HENT:   No scleral pallor or icterus noted. Oral mucosa is moist.  Chest:  Clear breath sounds.  Diminished breath sounds bilaterally. No crackles or wheezes.  CVS: S1 &S2 heard. No murmur.  Regular rate and rhythm. Abdomen: Soft, nontender, nondistended.  LAD is a abdominal ventral hernia.  Bowel sounds are heard.  Foley catheter in place with pinkish urine.. Extremities: No cyanosis, clubbing but with bilateral lower extremity nonpitting edema, peripheral pulses are palpable. Psych: Alert, awake and oriented, normal mood CNS:  No cranial nerve deficits.  Power equal in all extremities.   Skin: Warm and dry.  No rashes noted.   Data Reviewed: I have personally reviewed following labs and imaging studies  CBC: Recent Labs  Lab 05/09/21 1042 05/09/21 1453 05/09/21 1856 05/10/21 0035 05/10/21 1156 05/10/21 1959 05/11/21 0500 05/12/21 0509  WBC 5.7 6.0  --  6.2  --   --  8.8 10.0  NEUTROABS 4.7 4.4  --   --   --   --   --   --   HGB 7.8* 7.5*   < > 7.9*   7.9* 8.0* 8.0* 8.7* 7.6*  HCT 24.6* 23.4*   < > 25.0*   24.7* 25.0* 24.7* 27.7* 23.6*  MCV 98.8 97.5  --  95.4  --   --  100.0 97.1  PLT 307 286  --  277  --   --  253 187   < > = values in this interval not displayed.    Basic Metabolic Panel: Recent Labs  Lab 05/09/21 1042 05/10/21 0035 05/11/21 0500 05/12/21 0509  NA 139 135 137 138  K 3.8 3.3* 3.6 4.6  CL 106 102 107 110  CO2 19* 23 21* 17*  GLUCOSE 167* 138* 149* 122*  BUN 9 11 10 10   CREATININE 0.96 0.91 0.97 0.97  CALCIUM 8.3*  8.2* 8.3* 8.3*  MG  --   --  1.6* 2.0    GFR: Estimated Creatinine Clearance: 111.5 mL/min (by C-G formula based on SCr of 0.97 mg/dL). Liver Function Tests: Recent Labs  Lab 05/09/21 1042 05/10/21 0035  AST 32 22  ALT 20 17  ALKPHOS 63 67  BILITOT 0.6 0.8  PROT 7.1 6.3*  ALBUMIN 3.5 3.1*    No results for input(s): LIPASE, AMYLASE in the last 168 hours. No results for input(s): AMMONIA in the last 168 hours. Coagulation Profile: Recent Labs  Lab 05/09/21 1042 05/10/21 0035  INR 1.1 1.1    Cardiac Enzymes: No results for input(s): CKTOTAL, CKMB, CKMBINDEX, TROPONINI in the last 168 hours. BNP (last 3 results) No results for input(s): PROBNP in the last 8760 hours. HbA1C: Recent Labs  05/10/21 0035  HGBA1C 4.8    CBG: Recent Labs  Lab 05/11/21 1040 05/11/21 1204 05/11/21 1634 05/11/21 2142 05/12/21 0728  GLUCAP 119* 144* 196* 129* 160*    Lipid Profile: No results for input(s): CHOL, HDL, LDLCALC, TRIG, CHOLHDL, LDLDIRECT in the last 72 hours. Thyroid Function Tests: No results for input(s): TSH, T4TOTAL, FREET4, T3FREE, THYROIDAB in the last 72 hours. Anemia Panel: No results for input(s): VITAMINB12, FOLATE, FERRITIN, TIBC, IRON, RETICCTPCT in the last 72 hours. Sepsis Labs: Recent Labs  Lab 05/09/21 1459 05/09/21 1630 05/09/21 1856 05/10/21 0035  PROCALCITON  --   --   --  <0.10  LATICACIDVEN 4.2* 3.7* 3.5* 1.9     Recent Results (from the past 240 hour(s))  Blood Culture (routine x 2)     Status: None (Preliminary result)   Collection Time: 05/09/21 10:42 AM   Specimen: BLOOD  Result Value Ref Range Status   Specimen Description   Final    BLOOD LEFT ANTECUBITAL Performed at [facility], [place] [address], [place], [place] [address]    Special Requests   Final    BOTTLES DRAWN AEROBIC AND ANAEROBIC Blood Culture adequate volume Performed at [facility] [address] [place], [place]  [address]    Culture   Final    NO GROWTH 2 DAYS Performed at [facility] [address] [place], [place] [address]    Report Status PENDING  Incomplete  Urine Culture     Status: None   Collection Time: 05/09/21 10:42 AM   Specimen: In/Out Cath Urine  Result Value Ref Range Status   Specimen Description   Final    IN/OUT CATH URINE Performed at [facility] [address] [place], [place] [address]    Special Requests   Final    NONE Performed at [facility], [place] [address], [place], [place] [address]    Culture   Final    NO GROWTH Performed at [facility], [place] [address], [place], [place] [address]    Report Status 05/10/2021 FINAL  Final  Blood Culture (routine x 2)     Status: None (Preliminary result)   Collection Time: 05/09/21 10:47 AM   Specimen: BLOOD LEFT FOREARM  Result Value Ref Range Status   Specimen Description   Final    BLOOD LEFT FOREARM Performed at [facility] [address], [place], [place] [address]    Special Requests   Final    BOTTLES DRAWN AEROBIC AND ANAEROBIC Blood Culture adequate volume Performed at [facility] [address], [place], [place] [address]    Culture   Final    NO GROWTH 2 DAYS Performed at [facility] [address], [place],  [address]    Report Status PENDING  Incomplete  Resp Panel by RT-PCR (Flu A&B, Covid) Nasopharyngeal Swab     Status: None   Collection Time: 05/09/21  2:48 PM   Specimen: Nasopharyngeal Swab; Nasopharyngeal(NP) swabs in vial transport medium  Result Value Ref Range Status   SARS Coronavirus 2 by RT PCR NEGATIVE NEGATIVE Final    Comment: (NOTE) SARS-CoV-2 target nucleic acids are NOT DETECTED.  The SARS-CoV-2 RNA is generally detectable in upper respiratory specimens during the acute phase of infection. The lowest concentration of SARS-CoV-2 viral copies this assay can detect is 138  copies/mL. A negative result does not preclude SARS-Cov-2 infection and should not  be used as the sole basis for treatment or other patient management decisions. A negative result may occur with  improper specimen collection/handling, submission of specimen other than nasopharyngeal swab, presence of viral mutation(s) within the areas targeted by this assay, and inadequate number of viral copies(<138 copies/mL). A negative result must be combined with clinical observations, patient history, and epidemiological information. The expected result is Negative.  Fact Sheet for Patients:  EntrepreneurPulse.com.au  Fact Sheet for Healthcare Providers:  IncredibleEmployment.be  This test is no t yet approved or cleared by the [place] FDA and  has been authorized for detection and/or diagnosis of SARS-CoV-2 by FDA under an Emergency Use Authorization (EUA). This EUA will remain  in effect (meaning this test can be used) for the duration of the COVID-19 declaration under Section 564(b)(1) of the Act, 21 U.S.C.section 360bbb-3(b)(1), unless the authorization is terminated  or revoked sooner.       Influenza A by PCR NEGATIVE NEGATIVE Final   Influenza B by PCR NEGATIVE NEGATIVE Final    Comment: (NOTE) The Xpert Xpress SARS-CoV-2/FLU/RSV plus assay is intended as an aid in the diagnosis of influenza from Nasopharyngeal swab specimens and should not be used as a sole basis for treatment. Nasal washings and aspirates are unacceptable for Xpert Xpress SARS-CoV-2/FLU/RSV testing.  Fact Sheet for Patients: EntrepreneurPulse.com.au  Fact Sheet for Healthcare Providers: IncredibleEmployment.be  This test is not yet approved or cleared by the [place] FDA and has been authorized for detection and/or diagnosis of SARS-CoV-2 by FDA under an Emergency Use Authorization (EUA). This EUA will remain in effect (meaning  this test can be used) for the duration of the COVID-19 declaration under Section 564(b)(1) of the Act, 21 U.S.C. section 360bbb-3(b)(1), unless the authorization is terminated or revoked.  Performed at [facility], [place] [address] [place], [place] [address]   Surgical pcr screen     Status: None   Collection Time: 05/11/21  8:35 AM   Specimen: Nasal Mucosa; Nasal Swab  Result Value Ref Range Status   MRSA, PCR NEGATIVE NEGATIVE Final   Staphylococcus aureus NEGATIVE NEGATIVE Final    Comment: (NOTE) The Xpert SA Assay (FDA approved for NASAL specimens in patients 3 years of age and older), is one component of a comprehensive surveillance program. It is not intended to diagnose infection nor to guide or monitor treatment. Performed at [facility], [place] [address], [place], [place] [address]       Radiology Studies: No results found.  Scheduled Meds:  alfuzosin  10 mg Oral Daily   amLODipine  5 mg Oral BID   carvedilol  12.5 mg Oral BID   Chlorhexidine Gluconate Cloth  6 each Topical Daily   insulin aspart  0-15 Units Subcutaneous TID WC   insulin aspart  0-5 Units Subcutaneous QHS   lisinopril  30 mg Oral Daily   loratadine  10 mg Oral Daily   pravastatin  80 mg Oral q1800   Continuous Infusions:  sodium chloride Stopped (05/09/21 1620)   cefTRIAXone (ROCEPHIN)  IV Stopped (05/11/21 1510)   sodium chloride irrigation       LOS: 3 days    Flora Lipps, MD [facility] 05/12/2021, 8:28 AM

## 2021-05-13 LAB — CBC
HCT: 23.4 % — ABNORMAL LOW (ref 39.0–52.0)
Hemoglobin: 7.1 g/dL — ABNORMAL LOW (ref 13.0–17.0)
MCH: 31.1 pg (ref 26.0–34.0)
MCHC: 30.3 g/dL (ref 30.0–36.0)
MCV: 102.6 fL — ABNORMAL HIGH (ref 80.0–100.0)
Platelets: 203 10*3/uL (ref 150–400)
RBC: 2.28 MIL/uL — ABNORMAL LOW (ref 4.22–5.81)
RDW: 18.6 % — ABNORMAL HIGH (ref 11.5–15.5)
WBC: 8.3 10*3/uL (ref 4.0–10.5)
nRBC: 0.2 % (ref 0.0–0.2)

## 2021-05-13 LAB — GLUCOSE, CAPILLARY
Glucose-Capillary: 129 mg/dL — ABNORMAL HIGH (ref 70–99)
Glucose-Capillary: 163 mg/dL — ABNORMAL HIGH (ref 70–99)
Glucose-Capillary: 138 mg/dL — ABNORMAL HIGH (ref 70–99)
Glucose-Capillary: 167 mg/dL — ABNORMAL HIGH (ref 70–99)

## 2021-05-13 MED ORDER — SODIUM CHLORIDE 0.9 % IV SOLN: INTRAVENOUS | Status: AC

## 2021-05-13 NOTE — Progress Notes (Signed)
PROGRESS NOTE    Jeremy Clark  MGQ:[id number] DOB: [birth year] DOA: 05/09/2021 PCP: [name], MD    Brief Narrative:   Jeremy Clark is a 55 year old male with past medical history of type 2 diabetes, history of CVA, prostate cancer, hypertension, hyperlipidemia, morbid obesity presented to hospital on 05/09/2021 with complaints of recurrent hematuria.  Patient has been followed by urology as outpatient and had been on Foley catheter previously.  At home he was also noted to have elevated heart rate.  In the ED, patient was slightly hypertensive, tachycardic.  Creatinine was 0.9.  Hemoglobin was 7.8.  Urine analysis was abnormal.  COVID-19 PCR negative.  Influenza A/B PCR negative.    CT renal scan 12/11 with no explanation for hematuria,.  Urology was consulted from the ED who had an impression of radiation cystitis and recommended holding Plavix and starting ceftriaxone.  Patient was then admitted hospital for further evaluation and treatment.      Assessment & Plan:   Principal Problem:   Hematuria, gross Active Problems:   DM2 (diabetes mellitus, type 2) (HCC)   Essential hypertension   Hyperlipidemia   Abdominal wall hernia   Prostate cancer (HCC)   GAD (generalized anxiety disorder)   Lactic acidosis   Symptomatic anemia   History of CVA (cerebrovascular accident)  Symptomatic anemia 2/2 gross hematuria Patient had shortness of breath, tachycardia ,weakness dizziness on presentation.  Initial hemoglobin of 7.9 with baseline of 14.2.  Patient received PRBC transfusion during hospitalization.  Plavix was discontinued.  Urology followed the patient.  Patient underwent cystoscopy on 05/12/2021 and was started on CBI.  CBI was discontinued this morning but patient had blood clots in the next voiding.  Spoke with Dr. [name] about it.  We will continue to observe the patient while in the hospital.  Start on gentle IV fluids.  Might need CBI again if urinary retention and/or  persistent clots.  Check hemoglobin in AM.  Lactic acidosis: Resolved Lactic acid was elevated at 4.2.  Thought to be secondary to blood loss.  Patient received 3 L of IV fluid bolus and has received PRBC as well.  Lactate has resolved at this time.  Blood cultures negative so far.  Hypokalemia Improved after replacement.  Type 2 diabetes mellitus Latest hemoglobin A1c of 4.8.  On Ozempic 2 mg injected weekly, and dapagliflozin/metformin 5-1000mg  BID at home.  Continue sliding scale insulin while in hospital.  Latest POC glucose of 163.  Essential hypertension Continue amlodipine, Coreg, lisinopril.  Continue to monitor blood pressure.  Blood pressure seems to be stable.  Hyperlipidemia:  Continue statins.    Hx prostate cancer Follows with urology outpatient, Dr. [name].  Completed radiation therapy May 2022.  Continue Alfuzosin 10 mg p.o. daily.  Hx CVA We will continue to hold Plavix for now due to hematuria.  Ventral abdominal hernia  CT hematuria scan with large ventral abdominal wall hernia containing a long segment of nonobstructive transverse colon.  Supportive care for now.  No evidence of bowel obstruction.  Morbid obesity Body mass index is 53.05 kg/m.  Would benefit from weight loss as outpatient/bariatric evaluation.   DVT prophylaxis: SCDs Start: 05/09/21 1756    Code Status: Full Code  Family Communication:  Spoke with the patient at bedside.  Disposition Plan: Home  Status is: Inpatient  Remains inpatient appropriate because: Status post cystoscopy with continuous bladder irrigation,    Consultants:  Urology  Procedures:  Cystoscopy on 05/11/2021 with continuous bladder irrigation  PRBC transfusion  Antimicrobials:  Ceftriaxone 12/11>>   Subjective: Today, patient was seen and examined at bedside.  CBI was removed this morning but patient had large blood clots and dark urine subsequently.     Objective: Vitals:   05/12/21 1133 05/12/21 2155  05/13/21 0528 05/13/21 1319  BP: 102/61 137/66 121/62 (!) 142/73  Pulse: 84 98 96 80  Resp: 18 18 17 20   Temp: 98.3 F (36.8 C) 98.2 F (36.8 C) 98.2 F (36.8 C) 97.9 F (36.6 C)  TempSrc: Oral Oral Oral Oral  SpO2: 100% 100% 100% 100%  Weight:      Height:        Intake/Output Summary (Last 24 hours) at 05/13/2021 1329 Last data filed at 05/13/2021 1249 Gross per 24 hour  Intake 9580 ml  Output 17384 ml  Net -7804 ml    Filed Weights   05/09/21 1109 05/11/21 0850  Weight: (!) 140.2 kg (!) 140.2 kg    Physical examination:  General: Obese not in obvious distress HENT:   No scleral pallor or icterus noted. Oral mucosa is moist.  Chest:  Clear breath sounds.  Diminished breath sounds bilaterally. No crackles or wheezes.  CVS: S1 &S2 heard. No murmur.  Regular rate and rhythm. Abdomen: Soft, nontender, nondistended.  abdominal ventral hernia.  Bowel sounds are heard.   Extremities: No cyanosis, clubbing but with bilateral lower extremity nonpitting edema, peripheral pulses are palpable. Psych: Alert, awake and oriented, normal mood CNS:  No cranial nerve deficits.  Power equal in all extremities.   Skin: Warm and dry.  No rashes noted.   Data Reviewed: I have personally reviewed following labs and imaging studies  CBC: Recent Labs  Lab 05/09/21 1042 05/09/21 1453 05/09/21 1856 05/10/21 0035 05/10/21 1156 05/10/21 1959 05/11/21 0500 05/12/21 0509 05/13/21 0448  WBC 5.7 6.0  --  6.2  --   --  8.8 10.0 8.3  NEUTROABS 4.7 4.4  --   --   --   --   --   --   --   HGB 7.8* 7.5*   < > 7.9*   7.9* 8.0* 8.0* 8.7* 7.6* 7.1*  HCT 24.6* 23.4*   < > 25.0*   24.7* 25.0* 24.7* 27.7* 23.6* 23.4*  MCV 98.8 97.5  --  95.4  --   --  100.0 97.1 102.6*  PLT 307 286  --  277  --   --  253 187 203   < > = values in this interval not displayed.    Basic Metabolic Panel: Recent Labs  Lab 05/09/21 1042 05/10/21 0035 05/11/21 0500 05/12/21 0509  NA 139 135 137 138  K 3.8 3.3*  3.6 4.6  CL 106 102 107 110  CO2 19* 23 21* 17*  GLUCOSE 167* 138* 149* 122*  BUN 9 11 10 10   CREATININE 0.96 0.91 0.97 0.97  CALCIUM 8.3* 8.2* 8.3* 8.3*  MG  --   --  1.6* 2.0    GFR: Estimated Creatinine Clearance: 111.5 mL/min (by C-G formula based on SCr of 0.97 mg/dL). Liver Function Tests: Recent Labs  Lab 05/09/21 1042 05/10/21 0035  AST 32 22  ALT 20 17  ALKPHOS 63 67  BILITOT 0.6 0.8  PROT 7.1 6.3*  ALBUMIN 3.5 3.1*    No results for input(s): LIPASE, AMYLASE in the last 168 hours. No results for input(s): AMMONIA in the last 168 hours. Coagulation Profile: Recent Labs  Lab 05/09/21 1042 05/10/21 0035  INR 1.1  1.1    Cardiac Enzymes: No results for input(s): CKTOTAL, CKMB, CKMBINDEX, TROPONINI in the last 168 hours. BNP (last 3 results) No results for input(s): PROBNP in the last 8760 hours. HbA1C: No results for input(s): HGBA1C in the last 72 hours.  CBG: Recent Labs  Lab 05/12/21 1132 05/12/21 1623 05/12/21 2158 05/13/21 0742 05/13/21 1122  GLUCAP 137* 126* 179* 129* 163*    Lipid Profile: No results for input(s): CHOL, HDL, LDLCALC, TRIG, CHOLHDL, LDLDIRECT in the last 72 hours. Thyroid Function Tests: No results for input(s): TSH, T4TOTAL, FREET4, T3FREE, THYROIDAB in the last 72 hours. Anemia Panel: No results for input(s): VITAMINB12, FOLATE, FERRITIN, TIBC, IRON, RETICCTPCT in the last 72 hours. Sepsis Labs: Recent Labs  Lab 05/09/21 1459 05/09/21 1630 05/09/21 1856 05/10/21 0035  PROCALCITON  --   --   --  <0.10  LATICACIDVEN 4.2* 3.7* 3.5* 1.9     Recent Results (from the past 240 hour(s))  Blood Culture (routine x 2)     Status: None (Preliminary result)   Collection Time: 05/09/21 10:42 AM   Specimen: BLOOD  Result Value Ref Range Status   Specimen Description   Final    BLOOD LEFT ANTECUBITAL Performed at [facility], [place] [address], [place], [place] [address]    Special Requests   Final     BOTTLES DRAWN AEROBIC AND ANAEROBIC Blood Culture adequate volume Performed at [facility] [address], [place], [place] [address]    Culture   Final    NO GROWTH 4 DAYS Performed at [facility], [place] [address] [place], [place] [address]    Report Status PENDING  Incomplete  Urine Culture     Status: None   Collection Time: 05/09/21 10:42 AM   Specimen: In/Out Cath Urine  Result Value Ref Range Status   Specimen Description   Final    IN/OUT CATH URINE Performed at [facility] [address] [place], [place] [address]    Special Requests   Final    NONE Performed at [facility], [place] [address], [place], [place] [address]    Culture   Final    NO GROWTH Performed at [facility], [place] [address], [place], [place] [address]    Report Status 05/10/2021 FINAL  Final  Blood Culture (routine x 2)     Status: None (Preliminary result)   Collection Time: 05/09/21 10:47 AM   Specimen: BLOOD LEFT FOREARM  Result Value Ref Range Status   Specimen Description   Final    BLOOD LEFT FOREARM Performed at [facility] [address], [place], [place] [address]    Special Requests   Final    BOTTLES DRAWN AEROBIC AND ANAEROBIC Blood Culture adequate volume Performed at [facility] [address] [place], [place] [address]    Culture   Final    NO GROWTH 4 DAYS Performed at [facility], [place] [address] [place], [place] [address]    Report Status PENDING  Incomplete  Resp Panel by RT-PCR (Flu A&B, Covid) Nasopharyngeal Swab     Status: None   Collection Time: 05/09/21  2:48 PM   Specimen: Nasopharyngeal Swab; Nasopharyngeal(NP) swabs in vial transport medium  Result Value Ref Range Status   SARS Coronavirus 2 by RT PCR NEGATIVE NEGATIVE Final    Comment: (NOTE) SARS-CoV-2 target nucleic acids are NOT DETECTED.  The SARS-CoV-2 RNA is generally  detectable in  upper respiratory specimens during the acute phase of infection. The lowest concentration of SARS-CoV-2 viral copies this assay can detect is 138 copies/mL. A negative result does not preclude SARS-Cov-2 infection and should not be used as the sole basis for treatment or other patient management decisions. A negative result may occur with  improper specimen collection/handling, submission of specimen other than nasopharyngeal swab, presence of viral mutation(s) within the areas targeted by this assay, and inadequate number of viral copies(<138 copies/mL). A negative result must be combined with clinical observations, patient history, and epidemiological information. The expected result is Negative.  Fact Sheet for Patients:  EntrepreneurPulse.com.au  Fact Sheet for Healthcare Providers:  IncredibleEmployment.be  This test is no t yet approved or cleared by the [place] FDA and  has been authorized for detection and/or diagnosis of SARS-CoV-2 by FDA under an Emergency Use Authorization (EUA). This EUA will remain  in effect (meaning this test can be used) for the duration of the COVID-19 declaration under Section 564(b)(1) of the Act, 21 U.S.C.section 360bbb-3(b)(1), unless the authorization is terminated  or revoked sooner.       Influenza A by PCR NEGATIVE NEGATIVE Final   Influenza B by PCR NEGATIVE NEGATIVE Final    Comment: (NOTE) The Xpert Xpress SARS-CoV-2/FLU/RSV plus assay is intended as an aid in the diagnosis of influenza from Nasopharyngeal swab specimens and should not be used as a sole basis for treatment. Nasal washings and aspirates are unacceptable for Xpert Xpress SARS-CoV-2/FLU/RSV testing.  Fact Sheet for Patients: EntrepreneurPulse.com.au  Fact Sheet for Healthcare Providers: IncredibleEmployment.be  This test is not yet approved or cleared by the [place] FDA and has been  authorized for detection and/or diagnosis of SARS-CoV-2 by FDA under an Emergency Use Authorization (EUA). This EUA will remain in effect (meaning this test can be used) for the duration of the COVID-19 declaration under Section 564(b)(1) of the Act, 21 U.S.C. section 360bbb-3(b)(1), unless the authorization is terminated or revoked.  Performed at [facility], [place] [address] [place], [place] [address]   Surgical pcr screen     Status: None   Collection Time: 05/11/21  8:35 AM   Specimen: Nasal Mucosa; Nasal Swab  Result Value Ref Range Status   MRSA, PCR NEGATIVE NEGATIVE Final   Staphylococcus aureus NEGATIVE NEGATIVE Final    Comment: (NOTE) The Xpert SA Assay (FDA approved for NASAL specimens in patients 75 years of age and older), is one component of a comprehensive surveillance program. It is not intended to diagnose infection nor to guide or monitor treatment. Performed at [facility], [place] [address], [place], [place] [address]       Radiology Studies: No results found.  Scheduled Meds:  alfuzosin  10 mg Oral Daily   amLODipine  5 mg Oral BID   carvedilol  12.5 mg Oral BID   Chlorhexidine Gluconate Cloth  6 each Topical Daily   docusate sodium  100 mg Oral BID   insulin aspart  0-15 Units Subcutaneous TID WC   insulin aspart  0-5 Units Subcutaneous QHS   lisinopril  30 mg Oral Daily   loratadine  10 mg Oral Daily   polyethylene glycol  17 g Oral Daily   pravastatin  80 mg Oral q1800   Continuous Infusions:  sodium chloride Stopped (05/09/21 1620)   sodium chloride     cefTRIAXone (ROCEPHIN)  IV 2 g (05/13/21 0922)   sodium chloride irrigation  LOS: 4 days    Flora Lipps, MD [facility] 05/13/2021, 1:29 PM

## 2021-05-13 NOTE — Progress Notes (Signed)
2 Days Post-Op Subjective: Pt c/o bladder spasms and catheter discomfort overnight.  Urine remains light pink with CBI.  H/H slowly down trending.   Objective: Vital signs in last 24 hours: Temp:  [98.2 F (36.8 C)-98.3 F (36.8 C)] 98.2 F (36.8 C) (12/15 0528) Pulse Rate:  [84-98] 96 (12/15 0528) Resp:  [17-18] 17 (12/15 0528) BP: (102-137)/(61-66) 121/62 (12/15 0528) SpO2:  [100 %] 100 % (12/15 0528)  Intake/Output from previous day: 12/14 0701 - 12/15 0700 In: 18940 [P.O.:840; IV Piggyback:100] Out: 21600 [Urine:21600]  Intake/Output this shift: Total I/O In: -  Out: 2425 [Urine:2425]  Physical Exam:  General: Alert and oriented CV: RRR, palpable distal pulses Lungs: CTAB, equal chest rise Abdomen: Obese, Soft, NTND, no rebound or guarding Gu: 24 F 3-way Foley in place and draining light pink urine with CBI Ext: NT, No erythema  Lab Results: Recent Labs    05/11/21 0500 05/12/21 0509 05/13/21 0448  HGB 8.7* 7.6* 7.1*  HCT 27.7* 23.6* 23.4*   BMET Recent Labs    05/11/21 0500 05/12/21 0509  NA 137 138  K 3.6 4.6  CL 107 110  CO2 21* 17*  GLUCOSE 149* 122*  BUN 10 10  CREATININE 0.97 0.97  CALCIUM 8.3* 8.3*     Studies/Results: No results found.  Assessment/Plan: 55 year old male with a history of prostate cancer, s/p XRT in 2021/2022 with persistent gross hematuria.  POD2 s/p cystoscopy with clot evacuation and fulguration with Dr. [name]  -Voiding trial this AM.  If he is unable to void after 4 hours, while replace Foley and resume CBI. -H/H continues to slowly trend downward.  Recommend transfusion if hemoglobin drops below 7.0 or if he has changes in his vitals.  -Will continue to monitor    LOS: 4 days   Ellison Hughs, MD [facility] Pager: [phone]  05/13/2021, 9:42 AM

## 2021-05-13 NOTE — Progress Notes (Signed)
Urine has gotten more clear this afternoon. No clots noted with last void.

## 2021-05-13 NOTE — Progress Notes (Signed)
Pt voided 400 red urine darker than previous void with large blood clots. PRV bladder scan done, showed 139ml. Pt denies pain or bladder discomfort will discuss with attending.

## 2021-05-13 NOTE — Progress Notes (Signed)
Foley removed per protocol. Pt aware of voiding trial indication and need to void within 4 hours. All questions answered. New urinal provided. Medicated for bladder spasms. Urine in foley red/pink tinged prior to removal. No clots noted.

## 2021-05-14 LAB — CULTURE, BLOOD (ROUTINE X 2)
Culture: NO GROWTH
Culture: NO GROWTH
Special Requests: ADEQUATE
Special Requests: ADEQUATE

## 2021-05-14 LAB — GLUCOSE, CAPILLARY
Glucose-Capillary: 135 mg/dL — ABNORMAL HIGH (ref 70–99)
Glucose-Capillary: 174 mg/dL — ABNORMAL HIGH (ref 70–99)
Glucose-Capillary: 123 mg/dL — ABNORMAL HIGH (ref 70–99)
Glucose-Capillary: 139 mg/dL — ABNORMAL HIGH (ref 70–99)

## 2021-05-14 LAB — HEMOGLOBIN AND HEMATOCRIT, BLOOD
HCT: 22.4 % — ABNORMAL LOW (ref 39.0–52.0)
HCT: 23.7 % — ABNORMAL LOW (ref 39.0–52.0)
Hemoglobin: 6.9 g/dL — CL (ref 13.0–17.0)
Hemoglobin: 7.3 g/dL — ABNORMAL LOW (ref 13.0–17.0)

## 2021-05-14 LAB — PREPARE RBC (CROSSMATCH)

## 2021-05-14 MED ORDER — SODIUM CHLORIDE 0.9% IV SOLUTION: Freq: Once | INTRAVENOUS | Status: DC

## 2021-05-14 MED ORDER — SODIUM CHLORIDE 0.9 % IR SOLN
Status: DC
  Filled 2021-05-14 (×5): qty 30

## 2021-05-14 MED ORDER — BELLADONNA ALKALOIDS-OPIUM 16.2-30 MG RE SUPP
1.0000 | Freq: Once | RECTAL | Status: DC
  Filled 2021-05-14: qty 1

## 2021-05-14 MED ORDER — MELATONIN 5 MG PO TABS
5.0000 mg | ORAL_TABLET | Freq: Once | ORAL | Status: AC
  Administered 2021-05-14: 5 mg via ORAL
  Filled 2021-05-14: qty 1

## 2021-05-14 MED ORDER — OXYCODONE-ACETAMINOPHEN 5-325 MG PO TABS
1.0000 | ORAL_TABLET | Freq: Four times a day (QID) | ORAL | Status: DC | PRN
  Administered 2021-05-14 – 2021-05-15 (×5): 1 via ORAL
  Filled 2021-05-14 (×5): qty 1

## 2021-05-14 MED ORDER — BELLADONNA ALKALOIDS-OPIUM 16.2-30 MG RE SUPP
1.0000 | Freq: Three times a day (TID) | RECTAL | Status: DC
  Administered 2021-05-14 – 2021-05-16 (×5): 1 via RECTAL
  Filled 2021-05-14 (×5): qty 1

## 2021-05-14 MED ORDER — OXYBUTYNIN CHLORIDE 5 MG PO TABS
5.0000 mg | ORAL_TABLET | Freq: Three times a day (TID) | ORAL | Status: DC
  Administered 2021-05-14 – 2021-05-17 (×8): 5 mg via ORAL
  Filled 2021-05-14 (×8): qty 1

## 2021-05-14 NOTE — Progress Notes (Signed)
Foley cathter (4F) 3 way inserted, immediate bloody urinary return. FC irrigated per Urologist provider instruction. CBI(Normal saline) administered (500 cc). NS CBI stopped CBI (Alum ) started. Patient complained of bladder discomfort/spasms. FC stopped draining. Urologist paged to assist. V.O received to administer Belladonna-opium 16.2-30 mg suppository once now.

## 2021-05-14 NOTE — Progress Notes (Signed)
°  Transition of Care [facility]) Screening Note   Patient Details  Name: WOODFIN KISS Date of Birth: [birth year]   Transition of Care [facility]) CM/SW Contact:    Dessa Phi, RN Phone Number: 05/14/2021, 12:40 PM    Transition of Care Department [facility]) has reviewed patient and no TOC needs have been identified at this time. We will continue to monitor patient advancement through interdisciplinary progression rounds. If new patient transition needs arise, please place a TOC consult.

## 2021-05-14 NOTE — Progress Notes (Signed)
3 Days Post-Op Subjective: Persistent hematuria overnight, but the patient states that it is improving somewhat.  No other voiding complaints. H/H pending   Objective: Vital signs in last 24 hours: Temp:  [97.8 F (36.6 C)-98.7 F (37.1 C)] 98.7 F (37.1 C) (12/16 0510) Pulse Rate:  [80-92] 91 (12/16 0510) Resp:  [20] 20 (12/15 1947) BP: (125-142)/(55-73) 125/72 (12/16 0510) SpO2:  [100 %] 100 % (12/16 0510)  Intake/Output from previous day: 12/15 0701 - 12/16 0700 In: 109.6 [I.V.:9.6; IV Piggyback:100] Out: 7309 [Urine:7309]  Intake/Output this shift: No intake/output data recorded.  Physical Exam:  General: Alert and oriented   Lab Results: Recent Labs    05/12/21 0509 05/13/21 0448  HGB 7.6* 7.1*  HCT 23.6* 23.4*   BMET Recent Labs    05/12/21 0509  NA 138  K 4.6  CL 110  CO2 17*  GLUCOSE 122*  BUN 10  CREATININE 0.97  CALCIUM 8.3*     Studies/Results: No results found.  Assessment/Plan: 55 year old male with a history of prostate cancer, s/p XRT in 2021/2022 with persistent gross hematuria.  POD3 s/p cystoscopy with clot evacuation and fulguration with Dr. [name].  -H/H pending.  If hemoglobin is less than 7.0, recommend transfusion -Continue to monitor urine.  I instructed the patient to save his urine specimens for inspection later today.  If urine is no progressively improving, will reinsert a Foley catheter and start Alum irrigation.     LOS: 5 days   Ellison Hughs, MD [facility] Pager: [phone]  05/14/2021, 8:11 AM

## 2021-05-14 NOTE — Progress Notes (Signed)
22-F 3-way Foley catheter reinserted by nursing staff with return of blood tinged urine.  I was able to hand irrigate the Foley with no residual clot debris.  Alum CBI following freely and irrigant is now clear.  The patient is experiencing intense bladder spasms with CBI--scheduled B&O supporsitores and ditropan orders.  Continue Alum CBI until complete and then resume CBI with normal saline until instructed to stop.

## 2021-05-14 NOTE — Progress Notes (Signed)
PROGRESS NOTE    Jeremy Clark  PNT:[id number] DOB: Oct 09, 1965 DOA: 05/09/2021 PCP: [name], MD    Brief Narrative:   Jeremy Clark is a 55 year old male with past medical history of type 2 diabetes, history of CVA, prostate cancer, hypertension, hyperlipidemia, morbid obesity presented to hospital on 05/09/2021 with complaints of recurrent hematuria.  Patient has been followed by urology as outpatient and had been on Foley catheter previously.  At home he was also noted to have elevated heart rate.  In the ED, patient was slightly hypertensive, tachycardic.  Creatinine was 0.9.  Hemoglobin was 7.8.  Urine analysis was abnormal.  COVID-19 PCR negative.  Influenza A/B PCR negative.    CT renal scan 12/11 with no explanation for hematuria,.  Urology was consulted from the ED who had an impression of radiation cystitis and recommended holding Plavix and starting ceftriaxone.  Patient was then admitted hospital for further evaluation and treatment.      Assessment & Plan:   Principal Problem:   Hematuria, gross Active Problems:   DM2 (diabetes mellitus, type 2) (HCC)   Essential hypertension   Hyperlipidemia   Abdominal wall hernia   Prostate cancer (HCC)   GAD (generalized anxiety disorder)   Lactic acidosis   Symptomatic anemia   History of CVA (cerebrovascular accident)  Symptomatic anemia 2/2 gross hematuria Patient had shortness of breath, tachycardia ,weakness dizziness on presentation.  Initial hemoglobin of 7.9 with baseline of 14.2.  Patient received PRBC transfusion during hospitalization.  Plavix was discontinued.  Urology followed the patient.  Patient underwent cystoscopy on 05/12/2021 and was started on CBI.  CBI was discontinued on 05/13/2021 patient continues to have maroon color blood with some clots.  Urology actively following.  At this time plan is to continue to watch closely.  Might need CBI again.  Hemoglobin of 6.9 today.  Will transfuse 1 unit of packed  RBC today.  Spoke with the patient about it.  lactic acidosis: Resolved Lactic acid was elevated at 4.2.  Thought to be secondary to blood loss.  Patient received 3 L of IV fluid bolus and has received PRBC as well.  Lactate has resolved at this time.  Blood cultures negative so far.  Hypokalemia Improved after replacement.  BMP in AM.  Type 2 diabetes mellitus Latest hemoglobin A1c of 4.8.  On Ozempic 2 mg injected weekly, and dapagliflozin/metformin 5-1000mg  BID at home.  Continue sliding scale insulin while in hospital.  Latest POC glucose of 135.  Essential hypertension Continue amlodipine, Coreg, lisinopril.  Continue to monitor blood pressure.  Blood pressure seems to be stable.  Hyperlipidemia:  Continue statins.    Hx prostate cancer Follows with urology outpatient, Dr. [name].  Completed radiation therapy May 2022.  Continue Alfuzosin 10 mg p.o. daily.  Hx CVA Continue to hold Plavix for now due to hematuria.  Ventral abdominal hernia  CT hematuria scan with large ventral abdominal wall hernia containing a long segment of nonobstructive transverse colon.  Supportive care for now.  No evidence of bowel obstruction.  Morbid obesity Body mass index is 53.05 kg/m.  Would benefit from weight loss as outpatient/bariatric evaluation.   DVT prophylaxis: SCDs Start: 05/09/21 1756    Code Status: Full Code  Family Communication:  Spoke with the patient at bedside.  Disposition Plan: Home  Status is: Inpatient  Remains inpatient appropriate because: Status post cystoscopy with continuous hematuria.  Consultants:  Urology  Procedures:  Cystoscopy on 05/11/2021 with continuous bladder irrigation  PRBC transfusion  Antimicrobials:  Ceftriaxone 12/11>>  Subjective: Today, patient was seen and examined at bedside.  CBI was removed on 05/13/2021 but continues to have maroon-colored blood with small clots.  Denies pain.  No fever or chills.   Objective: Vitals:    05/13/21 1319 05/13/21 1947 05/14/21 0510 05/14/21 0847  BP: (!) 142/73 (!) 136/55 125/72 138/70  Pulse: 80 92 91 76  Resp: 20 20  18   Temp: 97.9 F (36.6 C) 97.8 F (36.6 C) 98.7 F (37.1 C)   TempSrc: Oral Oral Oral   SpO2: 100% 100% 100% 100%  Weight:      Height:        Intake/Output Summary (Last 24 hours) at 05/14/2021 1025 Last data filed at 05/14/2021 1018 Gross per 24 hour  Intake 229.55 ml  Output 4484 ml  Net -4254.45 ml    Filed Weights   05/09/21 1109 05/11/21 0850  Weight: (!) 140.2 kg (!) 140.2 kg    Physical examination: General: Obese built, not in obvious distress HENT:   No scleral pallor or icterus noted. Oral mucosa is moist.  Chest:   Diminished breath sounds bilaterally. No crackles or wheezes.  CVS: S1 &S2 heard. No murmur.  Regular rate and rhythm. Abdomen: Soft, nontender, nondistended.  Bowel sounds are heard.  Ventral hernia noted. Extremities: No cyanosis, clubbing with bilateral lower extremity nonpitting edema, peripheral pulses are palpable. Psych: Alert, awake and oriented, normal mood CNS:  No cranial nerve deficits.  Power equal in all extremities.   Skin: Warm and dry.  No rashes noted.   Data Reviewed: I have personally reviewed following labs and imaging studies  CBC: Recent Labs  Lab 05/09/21 1042 05/09/21 1453 05/09/21 1856 05/10/21 0035 05/10/21 1156 05/10/21 1959 05/11/21 0500 05/12/21 0509 05/13/21 0448 05/14/21 0803  WBC 5.7 6.0  --  6.2  --   --  8.8 10.0 8.3  --   NEUTROABS 4.7 4.4  --   --   --   --   --   --   --   --   HGB 7.8* 7.5*   < > 7.9*   7.9*   < > 8.0* 8.7* 7.6* 7.1* 6.9*  HCT 24.6* 23.4*   < > 25.0*   24.7*   < > 24.7* 27.7* 23.6* 23.4* 22.4*  MCV 98.8 97.5  --  95.4  --   --  100.0 97.1 102.6*  --   PLT 307 286  --  277  --   --  253 187 203  --    < > = values in this interval not displayed.    Basic Metabolic Panel: Recent Labs  Lab 05/09/21 1042 05/10/21 0035 05/11/21 0500 05/12/21 0509   NA 139 135 137 138  K 3.8 3.3* 3.6 4.6  CL 106 102 107 110  CO2 19* 23 21* 17*  GLUCOSE 167* 138* 149* 122*  BUN 9 11 10 10   CREATININE 0.96 0.91 0.97 0.97  CALCIUM 8.3* 8.2* 8.3* 8.3*  MG  --   --  1.6* 2.0    GFR: Estimated Creatinine Clearance: 111.5 mL/min (by C-G formula based on SCr of 0.97 mg/dL). Liver Function Tests: Recent Labs  Lab 05/09/21 1042 05/10/21 0035  AST 32 22  ALT 20 17  ALKPHOS 63 67  BILITOT 0.6 0.8  PROT 7.1 6.3*  ALBUMIN 3.5 3.1*    No results for input(s): LIPASE, AMYLASE in the last 168 hours. No results for input(s): AMMONIA  in the last 168 hours. Coagulation Profile: Recent Labs  Lab 05/09/21 1042 05/10/21 0035  INR 1.1 1.1    Cardiac Enzymes: No results for input(s): CKTOTAL, CKMB, CKMBINDEX, TROPONINI in the last 168 hours. BNP (last 3 results) No results for input(s): PROBNP in the last 8760 hours. HbA1C: No results for input(s): HGBA1C in the last 72 hours.  CBG: Recent Labs  Lab 05/13/21 0742 05/13/21 1122 05/13/21 1620 05/13/21 2112 05/14/21 0737  GLUCAP 129* 163* 138* 167* 135*    Lipid Profile: No results for input(s): CHOL, HDL, LDLCALC, TRIG, CHOLHDL, LDLDIRECT in the last 72 hours. Thyroid Function Tests: No results for input(s): TSH, T4TOTAL, FREET4, T3FREE, THYROIDAB in the last 72 hours. Anemia Panel: No results for input(s): VITAMINB12, FOLATE, FERRITIN, TIBC, IRON, RETICCTPCT in the last 72 hours. Sepsis Labs: Recent Labs  Lab 05/09/21 1459 05/09/21 1630 05/09/21 1856 05/10/21 0035  PROCALCITON  --   --   --  <0.10  LATICACIDVEN 4.2* 3.7* 3.5* 1.9     Recent Results (from the past 240 hour(s))  Blood Culture (routine x 2)     Status: None   Collection Time: 05/09/21 10:42 AM   Specimen: BLOOD  Result Value Ref Range Status   Specimen Description   Final    BLOOD LEFT ANTECUBITAL Performed at [facility], [place] [address], [place], [place] [address]    Special Requests    Final    BOTTLES DRAWN AEROBIC AND ANAEROBIC Blood Culture adequate volume Performed at [place] [address], [place], [place] [address]    Culture   Final    NO GROWTH 5 DAYS Performed at [facility], [place] [address], [place], [place] [address]    Report Status 05/14/2021 FINAL  Final  Urine Culture     Status: None   Collection Time: 05/09/21 10:42 AM   Specimen: In/Out Cath Urine  Result Value Ref Range Status   Specimen Description   Final    IN/OUT CATH URINE Performed at [facility] [address] [place], [place] [address]    Special Requests   Final    NONE Performed at [facility], [place] [address], [place], [place] [address]    Culture   Final    NO GROWTH Performed at [facility], [place] [address], [place], [place] [address]    Report Status 05/10/2021 FINAL  Final  Blood Culture (routine x 2)     Status: None   Collection Time: 05/09/21 10:47 AM   Specimen: BLOOD LEFT FOREARM  Result Value Ref Range Status   Specimen Description   Final    BLOOD LEFT FOREARM Performed at [facility] [address] [place], [place] [address]    Special Requests   Final    BOTTLES DRAWN AEROBIC AND ANAEROBIC Blood Culture adequate volume Performed at [facility] [address] [place], [place] [address]    Culture   Final    NO GROWTH 5 DAYS Performed at [facility], [place] [address] [place], [place] [address]    Report Status 05/14/2021 FINAL  Final  Resp Panel by RT-PCR (Flu A&B, Covid) Nasopharyngeal Swab     Status: None   Collection Time: 05/09/21  2:48 PM   Specimen: Nasopharyngeal Swab; Nasopharyngeal(NP) swabs in vial transport medium  Result Value Ref Range Status   SARS Coronavirus 2 by RT PCR NEGATIVE NEGATIVE Final  Comment: (NOTE) SARS-CoV-2 target nucleic acids are NOT DETECTED.  The SARS-CoV-2 RNA is generally detectable in upper  respiratory specimens during the acute phase of infection. The lowest concentration of SARS-CoV-2 viral copies this assay can detect is 138 copies/mL. A negative result does not preclude SARS-Cov-2 infection and should not be used as the sole basis for treatment or other patient management decisions. A negative result may occur with  improper specimen collection/handling, submission of specimen other than nasopharyngeal swab, presence of viral mutation(s) within the areas targeted by this assay, and inadequate number of viral copies(<138 copies/mL). A negative result must be combined with clinical observations, patient history, and epidemiological information. The expected result is Negative.  Fact Sheet for Patients:  EntrepreneurPulse.com.au  Fact Sheet for Healthcare Providers:  IncredibleEmployment.be  This test is no t yet approved or cleared by the [place] FDA and  has been authorized for detection and/or diagnosis of SARS-CoV-2 by FDA under an Emergency Use Authorization (EUA). This EUA will remain  in effect (meaning this test can be used) for the duration of the COVID-19 declaration under Section 564(b)(1) of the Act, 21 U.S.C.section 360bbb-3(b)(1), unless the authorization is terminated  or revoked sooner.       Influenza A by PCR NEGATIVE NEGATIVE Final   Influenza B by PCR NEGATIVE NEGATIVE Final    Comment: (NOTE) The Xpert Xpress SARS-CoV-2/FLU/RSV plus assay is intended as an aid in the diagnosis of influenza from Nasopharyngeal swab specimens and should not be used as a sole basis for treatment. Nasal washings and aspirates are unacceptable for Xpert Xpress SARS-CoV-2/FLU/RSV testing.  Fact Sheet for Patients: EntrepreneurPulse.com.au  Fact Sheet for Healthcare Providers: IncredibleEmployment.be  This test is not yet approved or cleared by the [place] FDA and has been  authorized for detection and/or diagnosis of SARS-CoV-2 by FDA under an Emergency Use Authorization (EUA). This EUA will remain in effect (meaning this test can be used) for the duration of the COVID-19 declaration under Section 564(b)(1) of the Act, 21 U.S.C. section 360bbb-3(b)(1), unless the authorization is terminated or revoked.  Performed at [facility], [place] [address], [place], [place] [address]   Surgical pcr screen     Status: None   Collection Time: 05/11/21  8:35 AM   Specimen: Nasal Mucosa; Nasal Swab  Result Value Ref Range Status   MRSA, PCR NEGATIVE NEGATIVE Final   Staphylococcus aureus NEGATIVE NEGATIVE Final    Comment: (NOTE) The Xpert SA Assay (FDA approved for NASAL specimens in patients 9 years of age and older), is one component of a comprehensive surveillance program. It is not intended to diagnose infection nor to guide or monitor treatment. Performed at [facility], [place] [address], [place], [place] [address]       Radiology Studies: No results found.  Scheduled Meds:  sodium chloride   Intravenous Once   alfuzosin  10 mg Oral Daily   amLODipine  5 mg Oral BID   carvedilol  12.5 mg Oral BID   Chlorhexidine Gluconate Cloth  6 each Topical Daily   docusate sodium  100 mg Oral BID   insulin aspart  0-15 Units Subcutaneous TID WC   insulin aspart  0-5 Units Subcutaneous QHS   lisinopril  30 mg Oral Daily   loratadine  10 mg Oral Daily   polyethylene glycol  17 g Oral Daily   Continuous Infusions:  sodium chloride Stopped (05/09/21 1620)   sodium chloride 75 mL/hr at 05/14/21 0416  cefTRIAXone (ROCEPHIN)  IV 2 g (05/14/21 0854)   sodium chloride irrigation       LOS: 5 days    Flora Lipps, MD [facility] 05/14/2021, 10:25 AM

## 2021-05-15 LAB — BASIC METABOLIC PANEL
Anion gap: 9 (ref 5–15)
BUN: 9 mg/dL (ref 6–20)
CO2: 24 mmol/L (ref 22–32)
Calcium: 8.2 mg/dL — ABNORMAL LOW (ref 8.9–10.3)
Chloride: 105 mmol/L (ref 98–111)
Creatinine, Ser: 0.86 mg/dL (ref 0.61–1.24)
GFR, Estimated: >60 mL/min (ref >60)
Glucose, Bld: 124 mg/dL — ABNORMAL HIGH (ref 70–99)
Potassium: 3.8 mmol/L (ref 3.5–5.1)
Sodium: 138 mmol/L (ref 135–145)

## 2021-05-15 LAB — CBC
HCT: 23.9 % — ABNORMAL LOW (ref 39.0–52.0)
Hemoglobin: 7.6 g/dL — ABNORMAL LOW (ref 13.0–17.0)
MCH: 30.6 pg (ref 26.0–34.0)
MCHC: 31.8 g/dL (ref 30.0–36.0)
MCV: 96.4 fL (ref 80.0–100.0)
Platelets: 217 10*3/uL (ref 150–400)
RBC: 2.48 MIL/uL — ABNORMAL LOW (ref 4.22–5.81)
RDW: 17.4 % — ABNORMAL HIGH (ref 11.5–15.5)
WBC: 5.7 10*3/uL (ref 4.0–10.5)
nRBC: 0 % (ref 0.0–0.2)

## 2021-05-15 LAB — GLUCOSE, CAPILLARY
Glucose-Capillary: 141 mg/dL — ABNORMAL HIGH (ref 70–99)
Glucose-Capillary: 161 mg/dL — ABNORMAL HIGH (ref 70–99)
Glucose-Capillary: 197 mg/dL — ABNORMAL HIGH (ref 70–99)
Glucose-Capillary: 167 mg/dL — ABNORMAL HIGH (ref 70–99)

## 2021-05-15 LAB — MAGNESIUM: Magnesium: 1.9 mg/dL (ref 1.7–2.4)

## 2021-05-15 NOTE — Progress Notes (Signed)
4 Days Post-Op Subjective: Denies pain.  No nausea or emesis.  He is having bladder spasms with Foley catheter.  Nursing irrigated catheter overnight with removal of only small amount of clots.  Urine is clear with only very slight pink-tinged with CBI clamped.  Objective: Vital signs in last 24 hours: Temp:  [97.8 F (36.6 C)-98.7 F (37.1 C)] 98.1 F (36.7 C) (12/17 0510) Pulse Rate:  [78-88] 87 (12/17 0510) Resp:  [16-20] 16 (12/17 0510) BP: (100-137)/(50-78) 128/78 (12/17 0923) SpO2:  [98 %-100 %] 99 % (12/17 0510)  Intake/Output from previous day: 12/16 0701 - 12/17 0700 In: 38182 [P.O.:240; Blood:332; IV Piggyback:100] Out: 99371 [Urine:41150] Intake/Output this shift: No intake/output data recorded.  Physical Exam:  General: Alert and oriented CV: RRR Lungs: Clear Abdomen: Soft, ND, NT, obese Ext: NT, No erythema  Lab Results: Recent Labs    05/14/21 0803 05/14/21 1834 05/15/21 0534  HGB 6.9* 7.3* 7.6*  HCT 22.4* 23.7* 23.9*   BMET Recent Labs    05/15/21 0534  NA 138  K 3.8  CL 105  CO2 24  GLUCOSE 124*  BUN 9  CREATININE 0.86  CALCIUM 8.2*     Studies/Results: No results found.  Assessment/Plan: Radiation cystitis: History of prostate cancer s/p EBRT in 2022 with persistent gross hematuria.  S/p clot evacuation by Dr. [name] on 05/10/2021.  -Urine is essentially clear with only very slight pink-tinged with CBI clamped.  I irrigated with 500 mL and removed only 2 small tiny clots.  Irrigated without difficulty.  I recommend void trial today.  I discussed with nursing staff to check PVRs.  If he develops clot retention, we will plan to replace 50 [place] or 24 [place] three-way Foley catheter to irrigate and resume CBI. -Hemoglobin remained stable at 7.6   LOS: 6 days   Matt R. Shirly Bartosiewicz MD 05/15/2021, 9:37 AM [facility]  Pager: [phone]

## 2021-05-15 NOTE — Progress Notes (Signed)
Bedside report completed. CBI (normal saline) infusing. Alum at bedside.

## 2021-05-15 NOTE — Progress Notes (Signed)
PROGRESS NOTE    Jeremy Clark  IOE:[id number] DOB: [birth year] DOA: 05/09/2021 PCP: [name], MD    Brief Narrative:   Jeremy Clark is a 55 year old male with past medical history of type 2 diabetes, history of CVA, prostate cancer, hypertension, hyperlipidemia, morbid obesity presented to hospital on 05/09/2021 with complaints of recurrent hematuria.  Patient has been followed by urology as outpatient and had been on Foley catheter previously.  At home he was also noted to have elevated heart rate.  In the ED, patient was slightly hypertensive, tachycardic.  Creatinine was 0.9.  Hemoglobin was 7.8.  Urine analysis was abnormal.  COVID-19 PCR negative.  Influenza A/B PCR negative.  CT renal scan 12/11 with no explanation for hematuria,.  Urology was consulted from the ED who had an impression of radiation cystitis and recommended holding Plavix.  Patient was then admitted hospital for further evaluation and treatment.     Assessment & Plan:   Principal Problem:   Hematuria, gross Active Problems:   DM2 (diabetes mellitus, type 2) (HCC)   Essential hypertension   Hyperlipidemia   Abdominal wall hernia   Prostate cancer (HCC)   GAD (generalized anxiety disorder)   Lactic acidosis   Symptomatic anemia   History of CVA (cerebrovascular accident)  Symptomatic anemia 2/2 gross hematuria   Initial hemoglobin of 7.9 with baseline of 14.2.  Patient received PRBC transfusion during hospitalization.  Plavix was discontinued.  Urology followed the patient.  Patient underwent cystoscopy on 05/12/2021 and was started on CBI.  CBI was discontinued on 05/13/2021 patient continued to have maroon color blood with some clots. Hemoglobin on 12/16 was  6.9 so patient was given 1 unit of packed RBC and was restarted on alum CBI.  CBI has been discontinued this morning.  We will follow urology recommendation.  Hemoglobin after transfusion at 7.6.  We will closely monitor.  On IV Rocephin for 7  days.  lactic acidosis: Resolved  Hypokalemia Improved,  Type 2 diabetes mellitus Latest hemoglobin A1c of 4.8.  On Ozempic 2 mg injected weekly, and dapagliflozin/metformin 5-1000mg  BID at home.  Continue sliding scale insulin while in hospital.  Latest POC glucose of 141  Essential hypertension Continue amlodipine, Coreg, lisinopril.  Stable at this time.  Hyperlipidemia:  Statin on hold.  Hx prostate cancer Follows with urology outpatient, Dr. [name].  Completed radiation therapy May 2022.  Continue Alfuzosin 10 mg p.o. daily.  Hx CVA Continue to hold Plavix for now due to hematuria.  Ventral abdominal hernia  CT hematuria scan with large ventral abdominal wall hernia containing a long segment of nonobstructive transverse colon.  Supportive care for now.  No evidence of bowel obstruction.  Morbid obesity Body mass index is 53.05 kg/m.  Would benefit from weight loss as outpatient/bariatric evaluation.   DVT prophylaxis: SCDs Start: 05/09/21 1756    Code Status: Full Code  Family Communication:  Spoke with the patient at bedside  Disposition Plan: Home  Status is: Inpatient  Remains inpatient appropriate because: Status post cystoscopy with continuous hematuria, status post CBI.  Consultants:  Urology  Procedures:  Cystoscopy on 05/11/2021 with continuous bladder irrigation PRBC transfusion  Antimicrobials:  Ceftriaxone 12/11>>  Subjective: Today, patient was seen and examined at bedside.  Patient had a spasms with Foley catheter.  CBI has been discontinued this morning.  Patient denies any fever, chills or rigor.   Objective: Vitals:   05/14/21 1545 05/14/21 1547 05/14/21 2044 05/15/21 0510  BP: (!) 112/50 Marland Kitchen)  112/57 118/63 137/68  Pulse: 78 88 85 87  Resp:  20 20 16   Temp: 97.8 F (36.6 C) 97.8 F (36.6 C) 98.4 F (36.9 C) 98.1 F (36.7 C)  TempSrc: Oral Oral Oral Axillary  SpO2: 100% 98% 99% 99%  Weight:      Height:        Intake/Output  Summary (Last 24 hours) at 05/15/2021 0815 Last data filed at 05/15/2021 0700 Gross per 24 hour  Intake 36672 ml  Output 40650 ml  Net -3978 ml    Filed Weights   05/09/21 1109 05/11/21 0850  Weight: (!) 140.2 kg (!) 140.2 kg    Physical examination:  General: Obese built, alert awake and oriented, not in distress. HENT:   Mild pallor noted.  Oral mucosa is moist.  Chest:   Diminished breath sounds bilaterally. No crackles or wheezes.  CVS: S1 &S2 heard. No murmur.  Regular rate and rhythm. Abdomen: Soft, nontender, nondistended.  Bowel sounds are heard.  Ventral hernia noted.  Extremities: No cyanosis, clubbing with bilateral nonpitting edema, peripheral pulses are palpable. Psych: Alert, awake and oriented, normal mood CNS:  No cranial nerve deficits.  Power equal in all extremities.   Skin: Warm and dry.  No rashes noted.   Data Reviewed: I have personally reviewed following labs and imaging studies  CBC: Recent Labs  Lab 05/09/21 1042 05/09/21 1453 05/09/21 1856 05/10/21 0035 05/10/21 1156 05/11/21 0500 05/12/21 0509 05/13/21 0448 05/14/21 0803 05/14/21 1834 05/15/21 0534  WBC 5.7 6.0  --  6.2  --  8.8 10.0 8.3  --   --  5.7  NEUTROABS 4.7 4.4  --   --   --   --   --   --   --   --   --   HGB 7.8* 7.5*   < > 7.9*   7.9*   < > 8.7* 7.6* 7.1* 6.9* 7.3* 7.6*  HCT 24.6* 23.4*   < > 25.0*   24.7*   < > 27.7* 23.6* 23.4* 22.4* 23.7* 23.9*  MCV 98.8 97.5  --  95.4  --  100.0 97.1 102.6*  --   --  96.4  PLT 307 286  --  277  --  253 187 203  --   --  217   < > = values in this interval not displayed.    Basic Metabolic Panel: Recent Labs  Lab 05/09/21 1042 05/10/21 0035 05/11/21 0500 05/12/21 0509 05/15/21 0534  NA 139 135 137 138 138  K 3.8 3.3* 3.6 4.6 3.8  CL 106 102 107 110 105  CO2 19* 23 21* 17* 24  GLUCOSE 167* 138* 149* 122* 124*  BUN 9 11 10 10 9   CREATININE 0.96 0.91 0.97 0.97 0.86  CALCIUM 8.3* 8.2* 8.3* 8.3* 8.2*  MG  --   --  1.6* 2.0 1.9     GFR: Estimated Creatinine Clearance: 125.7 mL/min (by C-G formula based on SCr of 0.86 mg/dL). Liver Function Tests: Recent Labs  Lab 05/09/21 1042 05/10/21 0035  AST 32 22  ALT 20 17  ALKPHOS 63 67  BILITOT 0.6 0.8  PROT 7.1 6.3*  ALBUMIN 3.5 3.1*    No results for input(s): LIPASE, AMYLASE in the last 168 hours. No results for input(s): AMMONIA in the last 168 hours. Coagulation Profile: Recent Labs  Lab 05/09/21 1042 05/10/21 0035  INR 1.1 1.1    Cardiac Enzymes: No results for input(s): CKTOTAL, CKMB, CKMBINDEX, TROPONINI in the  last 168 hours. BNP (last 3 results) No results for input(s): PROBNP in the last 8760 hours. HbA1C: No results for input(s): HGBA1C in the last 72 hours.  CBG: Recent Labs  Lab 05/14/21 0737 05/14/21 1146 05/14/21 1651 05/14/21 2035 05/15/21 0732  GLUCAP 135* 174* 123* 139* 141*    Lipid Profile: No results for input(s): CHOL, HDL, LDLCALC, TRIG, CHOLHDL, LDLDIRECT in the last 72 hours. Thyroid Function Tests: No results for input(s): TSH, T4TOTAL, FREET4, T3FREE, THYROIDAB in the last 72 hours. Anemia Panel: No results for input(s): VITAMINB12, FOLATE, FERRITIN, TIBC, IRON, RETICCTPCT in the last 72 hours. Sepsis Labs: Recent Labs  Lab 05/09/21 1459 05/09/21 1630 05/09/21 1856 05/10/21 0035  PROCALCITON  --   --   --  <0.10  LATICACIDVEN 4.2* 3.7* 3.5* 1.9     Recent Results (from the past 240 hour(s))  Blood Culture (routine x 2)     Status: None   Collection Time: 05/09/21 10:42 AM   Specimen: BLOOD  Result Value Ref Range Status   Specimen Description   Final    BLOOD LEFT ANTECUBITAL Performed at [facility], [place] [address], [place], [place] [address]    Special Requests   Final    BOTTLES DRAWN AEROBIC AND ANAEROBIC Blood Culture adequate volume Performed at [place] [address], [place], [place] [address]    Culture   Final    NO GROWTH 5 DAYS Performed  at [facility], [place] [address], [place], [place] [address]    Report Status 05/14/2021 FINAL  Final  Urine Culture     Status: None   Collection Time: 05/09/21 10:42 AM   Specimen: In/Out Cath Urine  Result Value Ref Range Status   Specimen Description   Final    IN/OUT CATH URINE Performed at [facility] [address], [place], [place] [address]    Special Requests   Final    NONE Performed at [facility], [place] [address] [place], [place] [address]    Culture   Final    NO GROWTH Performed at [facility], [place] [address], [place], [place] [address]    Report Status 05/10/2021 FINAL  Final  Blood Culture (routine x 2)     Status: None   Collection Time: 05/09/21 10:47 AM   Specimen: BLOOD LEFT FOREARM  Result Value Ref Range Status   Specimen Description   Final    BLOOD LEFT FOREARM Performed at [place] [place] [address] [place], [place] [address]    Special Requests   Final    BOTTLES DRAWN AEROBIC AND ANAEROBIC Blood Culture adequate volume Performed at [place] [address] [place],  [address]    Culture   Final    NO GROWTH 5 DAYS Performed at [facility], [place] [address] [place],  [address]    Report Status 05/14/2021 FINAL  Final  Resp Panel by RT-PCR (Flu A&B, Covid) Nasopharyngeal Swab     Status: None   Collection Time: 05/09/21  2:48 PM   Specimen: Nasopharyngeal Swab; Nasopharyngeal(NP) swabs in vial transport medium  Result Value Ref Range Status   SARS Coronavirus 2 by RT PCR NEGATIVE NEGATIVE Final    Comment: (NOTE) SARS-CoV-2 target nucleic acids are NOT DETECTED.  The SARS-CoV-2 RNA is generally detectable in upper respiratory specimens during the acute phase of infection. The lowest concentration of SARS-CoV-2 viral copies  this assay can detect is 138 copies/mL. A negative result does not preclude SARS-Cov-2 infection and  should not be used as the sole basis for treatment or other patient management decisions. A negative result may occur with  improper specimen collection/handling, submission of specimen other than nasopharyngeal swab, presence of viral mutation(s) within the areas targeted by this assay, and inadequate number of viral copies(<138 copies/mL). A negative result must be combined with clinical observations, patient history, and epidemiological information. The expected result is Negative.  Fact Sheet for Patients:  EntrepreneurPulse.com.au  Fact Sheet for Healthcare Providers:  IncredibleEmployment.be  This test is no t yet approved or cleared by the [place] FDA and  has been authorized for detection and/or diagnosis of SARS-CoV-2 by FDA under an Emergency Use Authorization (EUA). This EUA will remain  in effect (meaning this test can be used) for the duration of the COVID-19 declaration under Section 564(b)(1) of the Act, 21 U.S.C.section 360bbb-3(b)(1), unless the authorization is terminated  or revoked sooner.       Influenza A by PCR NEGATIVE NEGATIVE Final   Influenza B by PCR NEGATIVE NEGATIVE Final    Comment: (NOTE) The Xpert Xpress SARS-CoV-2/FLU/RSV plus assay is intended as an aid in the diagnosis of influenza from Nasopharyngeal swab specimens and should not be used as a sole basis for treatment. Nasal washings and aspirates are unacceptable for Xpert Xpress SARS-CoV-2/FLU/RSV testing.  Fact Sheet for Patients: EntrepreneurPulse.com.au  Fact Sheet for Healthcare Providers: IncredibleEmployment.be  This test is not yet approved or cleared by the [place] FDA and has been authorized for detection and/or diagnosis of SARS-CoV-2 by FDA under an Emergency Use Authorization (EUA). This EUA will remain in effect (meaning this test can be used) for the duration of the COVID-19 declaration  under Section 564(b)(1) of the Act, 21 U.S.C. section 360bbb-3(b)(1), unless the authorization is terminated or revoked.  Performed at [facility], [place] [address], [place], [place] [address]   Surgical pcr screen     Status: None   Collection Time: 05/11/21  8:35 AM   Specimen: Nasal Mucosa; Nasal Swab  Result Value Ref Range Status   MRSA, PCR NEGATIVE NEGATIVE Final   Staphylococcus aureus NEGATIVE NEGATIVE Final    Comment: (NOTE) The Xpert SA Assay (FDA approved for NASAL specimens in patients 17 years of age and older), is one component of a comprehensive surveillance program. It is not intended to diagnose infection nor to guide or monitor treatment. Performed at [facility], [place] [address] [place], [place] [address]       Radiology Studies: No results found.  Scheduled Meds:  sodium chloride   Intravenous Once   alfuzosin  10 mg Oral Daily   amLODipine  5 mg Oral BID   belladonna-opium  1 suppository Rectal Q8H   carvedilol  12.5 mg Oral BID   Chlorhexidine Gluconate Cloth  6 each Topical Daily   docusate sodium  100 mg Oral BID   insulin aspart  0-15 Units Subcutaneous TID WC   insulin aspart  0-5 Units Subcutaneous QHS   lisinopril  30 mg Oral Daily   loratadine  10 mg Oral Daily   oxybutynin  5 mg Oral Q8H   polyethylene glycol  17 g Oral Daily   Continuous Infusions:  sodium chloride Stopped (05/09/21 1620)   alum bladder irrigation 250 mL/hr at 05/14/21 1434   cefTRIAXone (ROCEPHIN)  IV 2 g (05/14/21 0854)   sodium chloride irrigation  LOS: 6 days    Flora Lipps, MD [facility] 05/15/2021, 8:15 AM

## 2021-05-16 LAB — CBC
HCT: 22.5 % — ABNORMAL LOW (ref 39.0–52.0)
Hemoglobin: 7 g/dL — ABNORMAL LOW (ref 13.0–17.0)
MCH: 30.3 pg (ref 26.0–34.0)
MCHC: 31.1 g/dL (ref 30.0–36.0)
MCV: 97.4 fL (ref 80.0–100.0)
Platelets: 213 10*3/uL (ref 150–400)
RBC: 2.31 MIL/uL — ABNORMAL LOW (ref 4.22–5.81)
RDW: 17.3 % — ABNORMAL HIGH (ref 11.5–15.5)
WBC: 4.5 10*3/uL (ref 4.0–10.5)
nRBC: 0 % (ref 0.0–0.2)

## 2021-05-16 LAB — GLUCOSE, CAPILLARY
Glucose-Capillary: 141 mg/dL — ABNORMAL HIGH (ref 70–99)
Glucose-Capillary: 151 mg/dL — ABNORMAL HIGH (ref 70–99)
Glucose-Capillary: 175 mg/dL — ABNORMAL HIGH (ref 70–99)
Glucose-Capillary: 189 mg/dL — ABNORMAL HIGH (ref 70–99)

## 2021-05-16 LAB — PREPARE RBC (CROSSMATCH)

## 2021-05-16 MED ORDER — SODIUM CHLORIDE 0.9% IV SOLUTION: Freq: Once | INTRAVENOUS | Status: AC

## 2021-05-16 NOTE — Progress Notes (Signed)
5 Days Post-Op Subjective: Denies pain.  Denies nausea or emesis.  He is voiding very slight pink-tinged without difficulty.  He denies passing clots.  He no longer has bladder spasms as the catheter has been removed.  He denies chest pain, dizziness, shortness of breath, or lightheadedness.  Objective: Vital signs in last 24 hours: Temp:  [97.9 F (36.6 C)-99.1 F (37.3 C)] 97.9 F (36.6 C) (12/18 0830) Pulse Rate:  [85-94] 85 (12/18 0830) Resp:  [16-20] 16 (12/18 0830) BP: (120-143)/(57-78) 143/73 (12/18 0830) SpO2:  [99 %-100 %] 100 % (12/18 0830)  Intake/Output from previous day: 12/17 0701 - 12/18 0700 In: 100 [IV Piggyback:100] Out: 2800 [Urine:2800] Intake/Output this shift: No intake/output data recorded.  Physical Exam:  General: Alert and oriented CV: RRR Lungs: Clear Abdomen: Soft, ND, NT, obese Ext: NT, No erythema  Lab Results: Recent Labs    05/14/21 1834 05/15/21 0534 05/16/21 0503  HGB 7.3* 7.6* 7.0*  HCT 23.7* 23.9* 22.5*   BMET Recent Labs    05/15/21 0534  NA 138  K 3.8  CL 105  CO2 24  GLUCOSE 124*  BUN 9  CREATININE 0.86  CALCIUM 8.2*     Studies/Results: No results found.  Assessment/Plan: Radiation cystitis: History of prostate cancer s/p EBRT in 2022 with persistent gross hematuria.  S/p clot evacuation by Dr. [name] on 05/10/2021.  -Foley was clear yesterday so cath was removed.  Urine in urinal is only very slight pink-tinged with no clots.  He has been voiding appropriately. -His hemoglobin this morning 7.0 from 7.6.  He is receiving 1 unit packed red blood cells per primary team. -I do not think that he requires Foley catheter placement given his only slight pink-tinged urine. -Anticipate discharge home tomorrow. -I did discuss with him and recommend hyperbaric oxygen therapy.  This can be arranged outpatient.    LOS: 7 days   Matt R. Reneshia Zuccaro MD 05/16/2021, 9:18 AM [facility]  Pager: [phone]

## 2021-05-16 NOTE — Plan of Care (Signed)
°  Problem: Health Behavior/Discharge Planning: Goal: Ability to manage health-related needs will improve Outcome: Progressing   Problem: Education: Goal: Knowledge of General Education information will improve Description: Including pain rating scale, medication(s)/side effects and non-pharmacologic comfort measures Outcome: Progressing   Problem: Activity: Goal: Risk for activity intolerance will decrease Outcome: Progressing

## 2021-05-16 NOTE — Progress Notes (Signed)
PROGRESS NOTE    Jeremy Clark  HLK:[id number] DOB: 01/10/1966 DOA: 05/09/2021 PCP: [name], MD    Brief Narrative:   Jeremy Clark is a 55 year old male with past medical history of type 2 diabetes, history of CVA, prostate cancer, hypertension, hyperlipidemia, morbid obesity presented to hospital on 05/09/2021 with complaints of recurrent hematuria.  Patient has been followed by urology as outpatient and had been on Foley catheter previously.  At home he was also noted to have elevated heart rate.  In the ED, patient was slightly hypertensive, tachycardic.  Creatinine was 0.9.  Hemoglobin was 7.8.  Urine analysis was abnormal.  COVID-19 PCR negative.  Influenza A/B PCR negative.  CT renal scan 12/11 with no explanation for hematuria,.  Urology was consulted from the ED who had an impression of radiation cystitis and recommended holding Plavix.  Patient was then admitted hospital for further evaluation and treatment.     Assessment & Plan:   Principal Problem:   Hematuria, gross Active Problems:   DM2 (diabetes mellitus, type 2) (HCC)   Essential hypertension   Hyperlipidemia   Abdominal wall hernia   Prostate cancer (HCC)   GAD (generalized anxiety disorder)   Lactic acidosis   Symptomatic anemia   History of CVA (cerebrovascular accident)  Symptomatic anemia 2/2 gross hematuria  Initial hemoglobin of 7.9 with baseline of 14.2.  Patient has received PRBC transfusion during hospitalization.  Hemoglobin of 7.0 today so we will transfuse 1 unit again.  Plavix hold.  Urology following the patient..  Patient underwent cystoscopy on 05/12/2021 and was started on CBI.  CBI was discontinued on 05/13/2021 patient continued to have maroon color blood with some clots. Hemoglobin on 12/16 was  6.9 so patient was given 1 unit of packed RBC and was restarted on alum CBI.  CBI was again then discontinued on 12/17.   Hemoglobin after transfusion at 7.6 but has drifted down to 7.0 today.   Will transfuse 1 unit..  Continues to have pinkish urine.  Urology has recommended possible disposition home tomorrow if he continues to improve.    On IV Rocephin for 7 days.  lactic acidosis: Resolved  Hypokalemia Improved, latest potassium of 3.8.  Type 2 diabetes mellitus Latest hemoglobin A1c of 4.8.  On Ozempic 2 mg injected weekly, and dapagliflozin/metformin 5-1000mg  BID at home.  Continue sliding scale insulin while in hospital.    Essential hypertension Continue amlodipine, Coreg, lisinopril.  Stable at this time.  Hyperlipidemia:  Statin on hold.  Hx prostate cancer Follows with urology outpatient, Dr. [name].  Completed radiation therapy May 2022.  Continue Alfuzosin 10 mg p.o. daily.  Hx CVA Continue to hold Plavix for now due to hematuria.  Ventral abdominal hernia  CT hematuria scan with large ventral abdominal wall hernia containing a long segment of nonobstructive transverse colon.  Supportive care for now.  No evidence of bowel obstruction.  Morbid obesity Body mass index is 53.05 kg/m.  Would benefit from weight loss as outpatient/bariatric evaluation.  Disposition likely home tomorrow if improved.  DVT prophylaxis: SCDs Start: 05/09/21 1756    Code Status: Full Code  Family Communication:  Spoke with the patient at bedside  Disposition Plan: Home  Status is: Inpatient  Remains inpatient appropriate because: Status post cystoscopy with continuous hematuria, status post CBI, need for blood transfusion per  Consultants:  Urology  Procedures:  Cystoscopy on 05/11/2021 with continuous bladder irrigation PRBC transfusion  Antimicrobials:  Ceftriaxone 12/11>>  Subjective: Today, patient was  seen and examined at bedside.  Patient denies any pain, nausea vomiting.  Still currently has pinkish urination but no clots.    Objective: Vitals:   05/15/21 0923 05/15/21 1131 05/15/21 2114 05/16/21 0830  BP: 128/78 (!) 122/57 (!) 120/59 (!) 143/73   Pulse:  94 91 85  Resp:  20 18 16   Temp:  99.1 F (37.3 C) 98.7 F (37.1 C) 97.9 F (36.6 C)  TempSrc:  Oral Oral Oral  SpO2:  99% 100% 100%  Weight:      Height:        Intake/Output Summary (Last 24 hours) at 05/16/2021 0957 Last data filed at 05/16/2021 0400 Gross per 24 hour  Intake 100 ml  Output 2800 ml  Net -2700 ml    Filed Weights   05/09/21 1109 05/11/21 0850  Weight: (!) 140.2 kg (!) 140.2 kg    Physical examination:  General: Obese built, not in obvious distress HENT: Mild pallor noted.  Oral mucosa is moist.  Chest:    Diminished breath sounds bilaterally. No crackles or wheezes.  CVS: S1 &S2 heard. No murmur.  Regular rate and rhythm. Abdomen: Soft, nontender, nondistended.  Bowel sounds are heard.  Ventral hernia noted. Extremities: No cyanosis, clubbing or edema.  Peripheral pulses are palpable. Psych: Alert, awake and oriented, normal mood CNS:  No cranial nerve deficits.  Power equal in all extremities.   Skin: Warm and dry.  No rashes noted.  Data Reviewed: I have personally reviewed following labs and imaging studies  CBC: Recent Labs  Lab 05/09/21 1042 05/09/21 1453 05/09/21 1856 05/11/21 0500 05/12/21 0509 05/13/21 0448 05/14/21 0803 05/14/21 1834 05/15/21 0534 05/16/21 0503  WBC 5.7 6.0   < > 8.8 10.0 8.3  --   --  5.7 4.5  NEUTROABS 4.7 4.4  --   --   --   --   --   --   --   --   HGB 7.8* 7.5*   < > 8.7* 7.6* 7.1* 6.9* 7.3* 7.6* 7.0*  HCT 24.6* 23.4*   < > 27.7* 23.6* 23.4* 22.4* 23.7* 23.9* 22.5*  MCV 98.8 97.5   < > 100.0 97.1 102.6*  --   --  96.4 97.4  PLT 307 286   < > 253 187 203  --   --  217 213   < > = values in this interval not displayed.    Basic Metabolic Panel: Recent Labs  Lab 05/09/21 1042 05/10/21 0035 05/11/21 0500 05/12/21 0509 05/15/21 0534  NA 139 135 137 138 138  K 3.8 3.3* 3.6 4.6 3.8  CL 106 102 107 110 105  CO2 19* 23 21* 17* 24  GLUCOSE 167* 138* 149* 122* 124*  BUN 9 11 10 10 9   CREATININE  0.96 0.91 0.97 0.97 0.86  CALCIUM 8.3* 8.2* 8.3* 8.3* 8.2*  MG  --   --  1.6* 2.0 1.9    GFR: Estimated Creatinine Clearance: 125.7 mL/min (by C-G formula based on SCr of 0.86 mg/dL). Liver Function Tests: Recent Labs  Lab 05/09/21 1042 05/10/21 0035  AST 32 22  ALT 20 17  ALKPHOS 63 67  BILITOT 0.6 0.8  PROT 7.1 6.3*  ALBUMIN 3.5 3.1*    No results for input(s): LIPASE, AMYLASE in the last 168 hours. No results for input(s): AMMONIA in the last 168 hours. Coagulation Profile: Recent Labs  Lab 05/09/21 1042 05/10/21 0035  INR 1.1 1.1    Cardiac Enzymes: No results for  input(s): CKTOTAL, CKMB, CKMBINDEX, TROPONINI in the last 168 hours. BNP (last 3 results) No results for input(s): PROBNP in the last 8760 hours. HbA1C: No results for input(s): HGBA1C in the last 72 hours.  CBG: Recent Labs  Lab 05/15/21 0732 05/15/21 1143 05/15/21 1638 05/15/21 2111 05/16/21 0745  GLUCAP 141* 161* 197* 167* 141*    Lipid Profile: No results for input(s): CHOL, HDL, LDLCALC, TRIG, CHOLHDL, LDLDIRECT in the last 72 hours. Thyroid Function Tests: No results for input(s): TSH, T4TOTAL, FREET4, T3FREE, THYROIDAB in the last 72 hours. Anemia Panel: No results for input(s): VITAMINB12, FOLATE, FERRITIN, TIBC, IRON, RETICCTPCT in the last 72 hours. Sepsis Labs: Recent Labs  Lab 05/09/21 1459 05/09/21 1630 05/09/21 1856 05/10/21 0035  PROCALCITON  --   --   --  <0.10  LATICACIDVEN 4.2* 3.7* 3.5* 1.9     Recent Results (from the past 240 hour(s))  Blood Culture (routine x 2)     Status: None   Collection Time: 05/09/21 10:42 AM   Specimen: BLOOD  Result Value Ref Range Status   Specimen Description   Final    BLOOD LEFT ANTECUBITAL Performed at [facility], [place] [address], [place], [place] [address]    Special Requests   Final    BOTTLES DRAWN AEROBIC AND ANAEROBIC Blood Culture adequate volume Performed at [place]  [address], [place], [place] [address]    Culture   Final    NO GROWTH 5 DAYS Performed at [facility], [place] [address] [place], [place] [address]    Report Status 05/14/2021 FINAL  Final  Urine Culture     Status: None   Collection Time: 05/09/21 10:42 AM   Specimen: In/Out Cath Urine  Result Value Ref Range Status   Specimen Description   Final    IN/OUT CATH URINE Performed at [place] [address], [place], [place] [address]    Special Requests   Final    NONE Performed at [facility], [place] [address] [place], [place] [address]    Culture   Final    NO GROWTH Performed at [facility], [place] [address], [place], [place] [address]    Report Status 05/10/2021 FINAL  Final  Blood Culture (routine x 2)     Status: None   Collection Time: 05/09/21 10:47 AM   Specimen: BLOOD LEFT FOREARM  Result Value Ref Range Status   Specimen Description   Final    BLOOD LEFT FOREARM Performed at [place] [address], [place], [place] [address]    Special Requests   Final    BOTTLES DRAWN AEROBIC AND ANAEROBIC Blood Culture adequate volume Performed at [place] [address], [place], [place] [address]    Culture   Final    NO GROWTH 5 DAYS Performed at [facility], [place] [address] [place], [place] [address]    Report Status 05/14/2021 FINAL  Final  Resp Panel by RT-PCR (Flu A&B, Covid) Nasopharyngeal Swab     Status: None   Collection Time: 05/09/21  2:48 PM   Specimen: Nasopharyngeal Swab; Nasopharyngeal(NP) swabs in vial transport medium  Result Value Ref Range Status   SARS Coronavirus 2 by RT PCR NEGATIVE NEGATIVE Final    Comment: (NOTE) SARS-CoV-2 target nucleic acids are NOT DETECTED.  The SARS-CoV-2 RNA is generally detectable in upper respiratory specimens during the acute phase of infection.  The lowest concentration of SARS-CoV-2 viral copies this assay can  detect is 138 copies/mL. A negative result does not preclude SARS-Cov-2 infection and should not be used as the sole basis for treatment or other patient management decisions. A negative result may occur with  improper specimen collection/handling, submission of specimen other than nasopharyngeal swab, presence of viral mutation(s) within the areas targeted by this assay, and inadequate number of viral copies(<138 copies/mL). A negative result must be combined with clinical observations, patient history, and epidemiological information. The expected result is Negative.  Fact Sheet for Patients:  EntrepreneurPulse.com.au  Fact Sheet for Healthcare Providers:  IncredibleEmployment.be  This test is no t yet approved or cleared by the [place] FDA and  has been authorized for detection and/or diagnosis of SARS-CoV-2 by FDA under an Emergency Use Authorization (EUA). This EUA will remain  in effect (meaning this test can be used) for the duration of the COVID-19 declaration under Section 564(b)(1) of the Act, 21 U.S.C.section 360bbb-3(b)(1), unless the authorization is terminated  or revoked sooner.       Influenza A by PCR NEGATIVE NEGATIVE Final   Influenza B by PCR NEGATIVE NEGATIVE Final    Comment: (NOTE) The Xpert Xpress SARS-CoV-2/FLU/RSV plus assay is intended as an aid in the diagnosis of influenza from Nasopharyngeal swab specimens and should not be used as a sole basis for treatment. Nasal washings and aspirates are unacceptable for Xpert Xpress SARS-CoV-2/FLU/RSV testing.  Fact Sheet for Patients: EntrepreneurPulse.com.au  Fact Sheet for Healthcare Providers: IncredibleEmployment.be  This test is not yet approved or cleared by the [place] FDA and has been authorized for detection and/or diagnosis of SARS-CoV-2 by FDA under an Emergency Use Authorization (EUA). This EUA will remain in  effect (meaning this test can be used) for the duration of the COVID-19 declaration under Section 564(b)(1) of the Act, 21 U.S.C. section 360bbb-3(b)(1), unless the authorization is terminated or revoked.  Performed at [facility], [place] [address] [place] [place], [place] [address]   Surgical pcr screen     Status: None   Collection Time: 05/11/21  8:35 AM   Specimen: Nasal Mucosa; Nasal Swab  Result Value Ref Range Status   MRSA, PCR NEGATIVE NEGATIVE Final   Staphylococcus aureus NEGATIVE NEGATIVE Final    Comment: (NOTE) The Xpert SA Assay (FDA approved for NASAL specimens in patients 9 years of age and older), is one component of a comprehensive surveillance program. It is not intended to diagnose infection nor to guide or monitor treatment. Performed at [facility], [place] [address] [place], [place] [address]       Radiology Studies: No results found.  Scheduled Meds:  sodium chloride   Intravenous Once   alfuzosin  10 mg Oral Daily   amLODipine  5 mg Oral BID   belladonna-opium  1 suppository Rectal Q8H   carvedilol  12.5 mg Oral BID   Chlorhexidine Gluconate Cloth  6 each Topical Daily   docusate sodium  100 mg Oral BID   insulin aspart  0-15 Units Subcutaneous TID WC   insulin aspart  0-5 Units Subcutaneous QHS   lisinopril  30 mg Oral Daily   loratadine  10 mg Oral Daily   oxybutynin  5 mg Oral Q8H   polyethylene glycol  17 g Oral Daily   Continuous Infusions:  cefTRIAXone (ROCEPHIN)  IV 2 g (05/15/21 0937)   sodium chloride irrigation       LOS: 7 days  Flora Lipps, MD [facility] 05/16/2021, 9:57 AM

## 2021-05-17 DIAGNOSIS — N3040 Irradiation cystitis without hematuria: Secondary | ICD-10-CM

## 2021-05-17 LAB — BPAM RBC
Blood Product Expiration Date: 202301172359
Blood Product Expiration Date: 202301172359
ISSUE DATE / TIME: 202212161252
ISSUE DATE / TIME: 202212180813
Unit Type and Rh: 5100
Unit Type and Rh: 5100

## 2021-05-17 LAB — TYPE AND SCREEN
ABO/RH(D): O POS
Antibody Screen: NEGATIVE
Unit division: 0
Unit division: 0

## 2021-05-17 LAB — CBC
HCT: 24.8 % — ABNORMAL LOW (ref 39.0–52.0)
Hemoglobin: 7.8 g/dL — ABNORMAL LOW (ref 13.0–17.0)
MCH: 29.8 pg (ref 26.0–34.0)
MCHC: 31.5 g/dL (ref 30.0–36.0)
MCV: 94.7 fL (ref 80.0–100.0)
Platelets: 241 10*3/uL (ref 150–400)
RBC: 2.62 MIL/uL — ABNORMAL LOW (ref 4.22–5.81)
RDW: 17.6 % — ABNORMAL HIGH (ref 11.5–15.5)
WBC: 5.5 10*3/uL (ref 4.0–10.5)
nRBC: 0 % (ref 0.0–0.2)

## 2021-05-17 LAB — GLUCOSE, CAPILLARY: Glucose-Capillary: 150 mg/dL — ABNORMAL HIGH (ref 70–99)

## 2021-05-17 MED ORDER — FERROUS SULFATE 325 (65 FE) MG PO TABS: 325.0000 mg | ORAL_TABLET | Freq: Three times a day (TID) | ORAL | 0 refills | Status: DC

## 2021-05-17 MED ORDER — DOCUSATE SODIUM 100 MG PO CAPS: 100.0000 mg | ORAL_CAPSULE | Freq: Every day | ORAL | 0 refills | Status: AC | PRN

## 2021-05-17 NOTE — Progress Notes (Signed)
Patient discharged per order, VSS, reviewed AVS with patient and he verbalizes understanding.  Both IV's removed.

## 2021-05-17 NOTE — Plan of Care (Signed)
°  Problem: Clinical Measurements: Goal: Ability to maintain clinical measurements within normal limits will improve Outcome: Progressing   Problem: Health Behavior/Discharge Planning: Goal: Ability to manage health-related needs will improve Outcome: Progressing   Problem: Clinical Measurements: Goal: Will remain free from infection Outcome: Progressing

## 2021-05-17 NOTE — Discharge Summary (Addendum)
Physician Discharge Summary  Jeremy Clark OQH:[id number] DOB: [birth year] DOA: 05/09/2021  PCP: [name], MD  Admit date: 05/09/2021 Discharge date: 05/17/2021  Admitted From: Home  Discharge disposition: Home  Recommendations for Outpatient Follow-Up:   Follow up with your primary care provider in one week.  Check CBC, BMP, magnesium in the next visit Follow-up with urology Dr. [name] as scheduled by the clinic   Discharge Diagnosis:   Principal Problem:   Hematuria, gross Active Problems:   DM2 (diabetes mellitus, type 2) ([place])   Essential hypertension   Hyperlipidemia   Abdominal wall hernia   Prostate cancer (HCC)   GAD (generalized anxiety disorder)   Lactic acidosis   Symptomatic anemia   History of CVA (cerebrovascular accident)   Discharge Condition: Improved.  Diet recommendation: Low sodium, heart healthy.  Carbohydrate-modified.    Wound care: None.  Code status: Full.   History of Present Illness:   Jeremy Clark is a 55 year old male with past medical history of type 2 diabetes, history of CVA, prostate cancer, hypertension, hyperlipidemia, morbid obesity presented to hospital on 05/09/2021 with complaints of recurrent hematuria.  Patient has been followed by urology as outpatient and had been on Foley catheter previously.  At home, he was also noted to have elevated heart rate.  In the ED, patient was slightly hypertensive, tachycardic.  Creatinine was 0.9.  Hemoglobin was 7.8.  Urine analysis was abnormal.  COVID-19 PCR negative.  Influenza A/B PCR negative.  CT renal scan 12/11 with no explanation for hematuria,.  Urology was consulted from the ED who had an impression of radiation cystitis and recommended holding Plavix.  Patient was then admitted hospital for further evaluation and treatment.       Hospital Course:   Following conditions were addressed during hospitalization as listed below,  Symptomatic Acute blood loss anemia  secondary to  gross hematuria  Initial hemoglobin of 7.9 with baseline of 14.2.  Patient has received PRBC transfusion during hospitalization. .  Urology f followed the patient during hospitalization and received continuous bladder irrigation including alum CBI.  Patient had a prolonged course due to hematuria but has had clear urination for last 1 day.  Patient received a PRBC transfusion for low hemoglobin during hospitalization.  At this time urology has recommended disposition home with outpatient follow-up.  Patient will be resumed on Plavix due to history of stroke and being off for more than 7 days now.  Communicated with Dr. [name], urology about disposition and urology did not recommend to further hold Plavix.  Hemoglobin prior to discharge was 7.8.  We will also prescribe iron on discharge   lactic acidosis: Resolved   Hypokalemia Resolved after replacement.   Type 2 diabetes mellitus Latest hemoglobin A1c of 4.8.  On Ozempic 2 mg injected weekly, and dapagliflozin/metformin 5-1000mg  BID at home.  This will be resumed on discharge.  Essential hypertension Continue amlodipine, Coreg, lisinopril from home.  Blood pressure remained stable during hospitalization.   Hyperlipidemia:  Statin on hold.  Possible UTI.  Patient completed a course of Rocephin for 7 days while in the hospital.  Urine culture was negative.   Hx prostate cancer Follows with urology outpatient, Dr. [name].  Completed radiation therapy May 2022.  Continue Alfuzosin 10 mg p.o. daily.  Follow-up with Dr. [name]   Hx CVA Resume Plavix on discharge due to prior history of CVA and being off Plavix for a week now   Ventral abdominal hernia  CT hematuria scan  with large ventral abdominal wall hernia containing a long segment of nonobstructive transverse colon.  Supportive care for now.  Morbid obesity Body mass index is 53.05 kg/m.  Would benefit from weight loss as outpatient/bariatric evaluation.   Disposition.   At this time, patient is stable for disposition home with outpatient urology and primary care physician follow-up.  Medical Consultants:   Urology  Procedures:    Cystoscopy on 05/11/2021 with continuous bladder irrigation PRBC transfusion Subjective:   Today, patient was seen and examined at bedside.  Has not had any blood in the urine overnight.   Denies overt pain nausea vomiting fever chills inquiring about going home.  Discharge Exam:   Vitals:   05/16/21 2100 05/17/21 0946  BP: (!) 145/72 (!) 109/57  Pulse: 86 82  Resp: 18   Temp: 98.4 F (36.9 C) 98.7 F (37.1 C)  SpO2: 100% 100%   Vitals:   05/16/21 0830 05/16/21 1115 05/16/21 2100 05/17/21 0946  BP: (!) 143/73 135/85 (!) 145/72 (!) 109/57  Pulse: 85 85 86 82  Resp: 16 16 18    Temp: 97.9 F (36.6 C) 98 F (36.7 C) 98.4 F (36.9 C) 98.7 F (37.1 C)  TempSrc: Oral Oral Oral Oral  SpO2: 100% 100% 100% 100%  Weight:      Height:        General: Alert awake, not in obvious distress, obese built HENT: pupils equally reacting to light, mild pallor noted. Oral mucosa is moist.  Chest:  Clear breath sounds.  Diminished breath sounds bilaterally. No crackles or wheezes.  CVS: S1 &S2 heard. No murmur.  Regular rate and rhythm. Abdomen: Soft, nontender, nondistended.  Bowel sounds are heard.  Ventral hernia noted in the abdomen Extremities: No cyanosis, clubbing or edema.  Peripheral pulses are palpable. Psych: Alert, awake and oriented, normal mood CNS:  No cranial nerve deficits.  Power equal in all extremities.   Skin: Warm and dry.  No rashes noted.  The results of significant diagnostics from this hospitalization (including imaging, microbiology, ancillary and laboratory) are listed below for reference.     Diagnostic Studies:   DG Chest Port 1 View  Result Date: 05/09/2021 CLINICAL DATA:  Chest pain. EXAM: PORTABLE CHEST 1 VIEW COMPARISON:  September 18, 2020. FINDINGS: The heart size and mediastinal  contours are within normal limits. Both lungs are clear. The visualized skeletal structures are unremarkable. IMPRESSION: No active disease. Electronically Signed   By: Marijo Conception M.D.   On: 05/09/2021 11:34   CT HEMATURIA WORKUP  Result Date: 05/09/2021 CLINICAL DATA:  Gross hematuria. EXAM: CT ABDOMEN AND PELVIS WITHOUT AND WITH CONTRAST TECHNIQUE: Multidetector CT imaging of the abdomen and pelvis was performed following the standard protocol before and following the bolus administration of intravenous contrast. CONTRAST:  114mL OMNIPAQUE IOHEXOL 350 MG/ML SOLN COMPARISON:  CT 12/08/2007 FINDINGS: Lower chest: Lung bases are clear. Hepatobiliary: Low-attenuation throughout the liver. Gallbladder normal. Pancreas: Pancreas is normal. No ductal dilatation. No pancreatic inflammation. Spleen: Normal spleen Adrenals/urinary tract: Adrenal glands mildly thickened. No nephrolithiasis or ureterolithiasis. No enhancing renal cortical lesion. Simple fluid attenuation lesion in the LEFT renal cortex measuring 10 mm (image 26/10). Delayed imaging demonstrates no filling defects in the collecting systems ureters. No bladder calculi, enhancing bladder lesions, or filling defect within the bladder. Stomach/Bowel: Stomach, small bowel, appendix, and cecum are normal. There is a large ventral abdominal wall hernia which contains a long segment (15-20 cm) nonobstructed transverse colon. Rectosigmoid colon normal. Vascular/Lymphatic: Abdominal  aorta is normal caliber. No periportal or retroperitoneal adenopathy. No pelvic adenopathy. Reproductive: Fiducial markers in the prostate gland. No acute findings. Other: ventral abdominal hernia sac measures 13 cm. The abdominal wall defect measures 2.8 cm. Musculoskeletal: No aggressive osseous lesion. IMPRESSION: 1. No explanation for hematuria. No nephrolithiasis, ureterolithiasis, enhancing renal cortical lesion, or filling defects within the collecting systems. 2. No bladder  stones or filling defects in the bladder which does not excluded a bladder lesion. 3. Simple cyst of the LEFT kidney (Bosniak 1). 4. Large ventral abdominal wall hernia contains a long segment of nonobstructed transverse colon. 5. Hepatic steatosis. Electronically Signed   By: Suzy Bouchard M.D.   On: 05/09/2021 14:22     Labs:   Basic Metabolic Panel: Recent Labs  Lab 05/11/21 0500 05/12/21 0509 05/15/21 0534  NA 137 138 138  K 3.6 4.6 3.8  CL 107 110 105  CO2 21* 17* 24  GLUCOSE 149* 122* 124*  BUN 10 10 9   CREATININE 0.97 0.97 0.86  CALCIUM 8.3* 8.3* 8.2*  MG 1.6* 2.0 1.9   GFR Estimated Creatinine Clearance: 125.7 mL/min (by C-G formula based on SCr of 0.86 mg/dL). Liver Function Tests: No results for input(s): AST, ALT, ALKPHOS, BILITOT, PROT, ALBUMIN in the last 168 hours. No results for input(s): LIPASE, AMYLASE in the last 168 hours. No results for input(s): AMMONIA in the last 168 hours. Coagulation profile No results for input(s): INR, PROTIME in the last 168 hours.  CBC: Recent Labs  Lab 05/12/21 0509 05/13/21 0448 05/14/21 0803 05/14/21 1834 05/15/21 0534 05/16/21 0503 05/17/21 0420  WBC 10.0 8.3  --   --  5.7 4.5 5.5  HGB 7.6* 7.1* 6.9* 7.3* 7.6* 7.0* 7.8*  HCT 23.6* 23.4* 22.4* 23.7* 23.9* 22.5* 24.8*  MCV 97.1 102.6*  --   --  96.4 97.4 94.7  PLT 187 203  --   --  217 213 241   Cardiac Enzymes: No results for input(s): CKTOTAL, CKMB, CKMBINDEX, TROPONINI in the last 168 hours. BNP: Invalid input(s): POCBNP CBG: Recent Labs  Lab 05/16/21 0745 05/16/21 1118 05/16/21 1646 05/16/21 2058 05/17/21 0735  GLUCAP 141* 151* 175* 189* 150*   D-Dimer No results for input(s): DDIMER in the last 72 hours. Hgb A1c No results for input(s): HGBA1C in the last 72 hours. Lipid Profile No results for input(s): CHOL, HDL, LDLCALC, TRIG, CHOLHDL, LDLDIRECT in the last 72 hours. Thyroid function studies No results for input(s): TSH, T4TOTAL, T3FREE,  THYROIDAB in the last 72 hours.  Invalid input(s): FREET3 Anemia work up No results for input(s): VITAMINB12, FOLATE, FERRITIN, TIBC, IRON, RETICCTPCT in the last 72 hours. Microbiology Recent Results (from the past 240 hour(s))  Blood Culture (routine x 2)     Status: None   Collection Time: 05/09/21 10:42 AM   Specimen: BLOOD  Result Value Ref Range Status   Specimen Description   Final    BLOOD LEFT ANTECUBITAL Performed at [facility] [address], [place], [place] [address]    Special Requests   Final    BOTTLES DRAWN AEROBIC AND ANAEROBIC Blood Culture adequate volume Performed at [facility] [address] [place], [place] [address]    Culture   Final    NO GROWTH 5 DAYS Performed at [facility], [place] [address] [place], [place] [address]    Report Status 05/14/2021 FINAL  Final  Urine Culture     Status: None   Collection Time:  05/09/21 10:42 AM   Specimen: In/Out Cath Urine  Result Value Ref Range Status   Specimen Description   Final    IN/OUT CATH URINE Performed at [facility], [place], [place] [address], [place], [place] [address]    Special Requests   Final    NONE Performed at [facility], [place] [address] [place], [place] [address]    Culture   Final    NO GROWTH Performed at [facility], [place] [address] [place], [place] [address]    Report Status 05/10/2021 FINAL  Final  Blood Culture (routine x 2)     Status: None   Collection Time: 05/09/21 10:47 AM   Specimen: BLOOD LEFT FOREARM  Result Value Ref Range Status   Specimen Description   Final    BLOOD LEFT FOREARM Performed at [facility] [address], [place], [place] [address]    Special Requests   Final    BOTTLES DRAWN AEROBIC AND ANAEROBIC Blood Culture adequate volume Performed at [facility] [address] [place], [place] [address]    Culture   Final    NO  GROWTH 5 DAYS Performed at [facility], [place] [address], [place], [place] [address]    Report Status 05/14/2021 FINAL  Final  Resp Panel by RT-PCR (Flu A&B, Covid) Nasopharyngeal Swab     Status: None   Collection Time: 05/09/21  2:48 PM   Specimen: Nasopharyngeal Swab; Nasopharyngeal(NP) swabs in vial transport medium  Result Value Ref Range Status   SARS Coronavirus 2 by RT PCR NEGATIVE NEGATIVE Final    Comment: (NOTE) SARS-CoV-2 target nucleic acids are NOT DETECTED.  The SARS-CoV-2 RNA is generally detectable in upper respiratory specimens during the acute phase of infection. The lowest concentration of SARS-CoV-2 viral copies this assay can detect is 138 copies/mL. A negative result does not preclude SARS-Cov-2 infection and should not be used as the sole basis for treatment or other patient management decisions. A negative result may occur with  improper specimen collection/handling, submission of specimen other than nasopharyngeal swab, presence of viral mutation(s) within the areas targeted by this assay, and inadequate number of viral copies(<138 copies/mL). A negative result must be combined with clinical observations, patient history, and epidemiological information. The expected result is Negative.  Fact Sheet for Patients:  EntrepreneurPulse.com.au  Fact Sheet for Healthcare Providers:  IncredibleEmployment.be  This test is no t yet approved or cleared by the [place] FDA and  has been authorized for detection and/or diagnosis of SARS-CoV-2 by FDA under an Emergency Use Authorization (EUA). This EUA will remain  in effect (meaning this test can be used) for the duration of the COVID-19 declaration under Section 564(b)(1) of the Act, 21 U.S.C.section 360bbb-3(b)(1), unless the authorization is terminated  or revoked sooner.       Influenza A by PCR NEGATIVE NEGATIVE Final   Influenza B by PCR NEGATIVE NEGATIVE Final     Comment: (NOTE) The Xpert Xpress SARS-CoV-2/FLU/RSV plus assay is intended as an aid in the diagnosis of influenza from Nasopharyngeal swab specimens and should not be used as a sole basis for treatment. Nasal washings and aspirates are unacceptable for Xpert Xpress SARS-CoV-2/FLU/RSV testing.  Fact Sheet for Patients: EntrepreneurPulse.com.au  Fact Sheet for Healthcare Providers: IncredibleEmployment.be  This test is not yet approved or cleared by the [place] FDA and has been authorized for detection and/or diagnosis of SARS-CoV-2 by FDA under an Emergency Use  Authorization (EUA). This EUA will remain in effect (meaning this test can be used) for the duration of the COVID-19 declaration under Section 564(b)(1) of the Act, 21 U.S.C. section 360bbb-3(b)(1), unless the authorization is terminated or revoked.  Performed at [facility], [place] [address], [place], [place] [address]   Surgical pcr screen     Status: None   Collection Time: 05/11/21  8:35 AM   Specimen: Nasal Mucosa; Nasal Swab  Result Value Ref Range Status   MRSA, PCR NEGATIVE NEGATIVE Final   Staphylococcus aureus NEGATIVE NEGATIVE Final    Comment: (NOTE) The Xpert SA Assay (FDA approved for NASAL specimens in patients 89 years of age and older), is one component of a comprehensive surveillance program. It is not intended to diagnose infection nor to guide or monitor treatment. Performed at [facility], [place] [address], [place], [place] [address]      Discharge Instructions:   Discharge Instructions     Diet - low sodium heart healthy   Complete by: As directed    Discharge instructions   Complete by: As directed    Follow-up with your primary care physician in 1 week.  Check blood work at that time.  Follow-up with urology Dr. [name] in 1 week or as scheduled by the clinic for follow-up of bladder issues.  Seek medical  attention for worsening symptoms including urinary retention and ongoing blood clots when urinating   Increase activity slowly   Complete by: As directed       Allergies as of 05/17/2021       Reactions   Doxycycline Rash        Medication List     TAKE these medications    alfuzosin 10 MG 24 hr tablet Commonly known as: UROXATRAL Take 10 mg by mouth daily.   amLODipine 5 MG tablet Commonly known as: NORVASC Take 5 mg by mouth 2 (two) times daily.   carvedilol 12.5 MG tablet Commonly known as: COREG Take 12.5 mg by mouth 2 (two) times daily.   cetirizine 10 MG tablet Commonly known as: ZYRTEC Take 10 mg by mouth daily.   clopidogrel 75 MG tablet Commonly known as: PLAVIX Take 1 tablet (75 mg total) by mouth daily.   docusate sodium 100 MG capsule Commonly known as: COLACE Take 1 capsule (100 mg total) by mouth daily as needed for mild constipation.   ferrous sulfate 325 (65 FE) MG tablet Take 1 tablet (325 mg total) by mouth 3 (three) times daily with meals.   lisinopril 30 MG tablet Commonly known as: ZESTRIL Take 30 mg by mouth daily.   Ozempic (2 MG/DOSE) 8 MG/3ML Sopn Generic drug: Semaglutide (2 MG/DOSE) Inject 2 mg into the skin every Monday.   pravastatin 80 MG tablet Commonly known as: PRAVACHOL Take 1 tablet (80 mg total) by mouth daily at 6 PM.   Xigduo XR 09-998 MG Tb24 Generic drug: Dapagliflozin-metFORMIN HCl ER Take 1 tablet by mouth 2 (two) times daily.          Follow-up Information     [facility] Follow up in 2 week(s).   Why: The office will call to schedule your appointment Contact information: [place] [address] [phone]         [name], MD Follow up in 1 week(s).   Specialty: Family Medicine Contact information: [place] [address] [phone]  Time coordinating discharge: 39  minutes  Signed:  Woodfin Kiss  [facility] 05/17/2021, 11:52 AM

## 2021-05-17 NOTE — Progress Notes (Signed)
6 Days Post-Op Subjective: Pt with no complaint and reports that his urine is clear.  H/H stable  Objective: Vital signs in last 24 hours: Temp:  [98 F (36.7 C)-98.7 F (37.1 C)] 98.7 F (37.1 C) (12/19 0946) Pulse Rate:  [82-86] 82 (12/19 0946) Resp:  [16-18] 18 (12/18 2100) BP: (109-145)/(57-85) 109/57 (12/19 0946) SpO2:  [100 %] 100 % (12/19 0946)  Intake/Output from previous day: 12/18 0701 - 12/19 0700 In: 343.8 [Blood:343.8] Out: 525 [Urine:525]  Intake/Output this shift: No intake/output data recorded.  Physical Exam:  General: Alert and oriented   Lab Results: Recent Labs    05/15/21 0534 05/16/21 0503 05/17/21 0420  HGB 7.6* 7.0* 7.8*  HCT 23.9* 22.5* 24.8*   BMET Recent Labs    05/15/21 0534  NA 138  K 3.8  CL 105  CO2 24  GLUCOSE 124*  BUN 9  CREATININE 0.86  CALCIUM 8.2*     Studies/Results: No results found.  Assessment/Plan: 55 year old male with a history of prostate cancer, s/p XRT in 2021/2022 with persistent gross hematuria.  POD6 s/p cystoscopy with clot evacuation and fulguration with Dr. [name].  -ok for d/c.  Will arrange OP hyperbaric oxygen and office f/u in 2 weeks  LOS: 8 days   Ellison Hughs, MD [facility] Pager: [phone]  05/17/2021, 10:32 AM

## 2021-05-25 ENCOUNTER — Ambulatory Visit (INDEPENDENT_AMBULATORY_CARE_PROVIDER_SITE_OTHER): Payer: Self-pay | Admitting: Podiatry

## 2021-05-25 DIAGNOSIS — Z91199 Patient's noncompliance with other medical treatment and regimen due to unspecified reason: Secondary | ICD-10-CM

## 2021-05-28 ENCOUNTER — Emergency Department (HOSPITAL_COMMUNITY)
Admission: EM | Admit: 2021-05-28 | Discharge: 2021-05-29 | Disposition: A | Discharge status: Home / Self Care | Payer: 59 | Attending: Emergency Medicine | Admitting: Emergency Medicine

## 2021-05-28 ENCOUNTER — Other Ambulatory Visit: Payer: Self-pay

## 2021-05-28 ENCOUNTER — Encounter (HOSPITAL_COMMUNITY): Payer: Self-pay

## 2021-05-28 DIAGNOSIS — R7989 Other specified abnormal findings of blood chemistry: Secondary | ICD-10-CM | POA: Insufficient documentation

## 2021-05-28 DIAGNOSIS — R319 Hematuria, unspecified: Secondary | ICD-10-CM | POA: Diagnosis not present

## 2021-05-28 DIAGNOSIS — Z5321 Procedure and treatment not carried out due to patient leaving prior to being seen by health care provider: Secondary | ICD-10-CM | POA: Insufficient documentation

## 2021-05-28 NOTE — ED Provider Notes (Signed)
Emergency Medicine Provider Triage Evaluation Note  Jeremy Clark , a 55 y.o. male  was evaluated in triage.  Pt complains of low hemoglobin.  Patient visited his primary care doctor yesterday where he had his values rechecked.  This morning to inform him about his abnormal blood count.  Patient had a transfusion done 10 days ago.  He is chronic urinary bleeding following treatment for bladder cancer.  Review of Systems  Positive: Dizziness upon standing, weakness Negative: Palpitations, chest pain, shortness of breath, abdominal pain, N/V/D  Physical Exam  BP (!) 171/88 (BP Location: Left Arm)    Pulse 88    Temp 98.8 F (37.1 C) (Oral)    Resp 18    SpO2 100%  Gen:   Awake, no distress   Resp:  Normal effort  MSK:   Moves extremities without difficulty  Other:  Lungs clear to auscultation really, abdomen soft, rounded nontender with large umbilical hernia  Medical Decision Making  Medically screening exam initiated at 8:33 AM.  Appropriate orders placed.  Sandria Bales was informed that the remainder of the evaluation will be completed by another provider, this initial triage assessment does not replace that evaluation, and the importance of remaining in the ED until their evaluation is complete.     Tonye Pearson, [place] 05/28/21 1047    Truddie Hidden, MD 05/28/21 [phone]

## 2021-05-28 NOTE — ED Triage Notes (Signed)
Patient reports that he has a HGb-6.8. Patient also c/o hematuria x 1 month.

## 2021-06-01 NOTE — Progress Notes (Signed)
Patient was no-show for appointment today 

## 2021-06-14 ENCOUNTER — Encounter: Payer: Self-pay | Admitting: Podiatry

## 2021-06-14 ENCOUNTER — Ambulatory Visit: Payer: BC Managed Care – PPO | Admitting: Podiatry

## 2021-06-14 ENCOUNTER — Other Ambulatory Visit: Payer: Self-pay

## 2021-06-14 DIAGNOSIS — M79675 Pain in left toe(s): Secondary | ICD-10-CM

## 2021-06-14 DIAGNOSIS — E118 Type 2 diabetes mellitus with unspecified complications: Secondary | ICD-10-CM

## 2021-06-14 DIAGNOSIS — M79674 Pain in right toe(s): Secondary | ICD-10-CM

## 2021-06-14 DIAGNOSIS — B351 Tinea unguium: Secondary | ICD-10-CM

## 2021-06-14 DIAGNOSIS — Z794 Long term (current) use of insulin: Secondary | ICD-10-CM

## 2021-06-14 NOTE — Progress Notes (Signed)
°  Subjective:  Patient ID: Jeremy Clark, male    DOB: 1966-03-02,  MRN: [id number]  Chief Complaint  Patient presents with   Nail Problem    Thick painful toenails, 3 month follow up    56 y.o. male returns with the above complaint. History confirmed with patient.  Could not make his last appointment he was hospitalized with hematuria this is doing better but he had to be transferred to [facility] for care for this  Objective:  Physical Exam: warm, good capillary refill, no trophic changes or ulcerative lesions, normal DP and PT pulses, normal sensory exam, onychomycosis x10 with subungual rate, yellow and brown elongated toenails with thinning of nail plates  Assessment:   1. Pain due to onychomycosis of toenails of both feet   2. Controlled type 2 diabetes mellitus with complication, with long-term current use of insulin ([place])       Plan:  Patient was evaluated and treated and all questions answered.  Patient educated on diabetes. Discussed proper diabetic foot care and discussed risks and complications of disease. Educated patient in depth on reasons to return to the office immediately should he/she discover anything concerning or new on the feet. All questions answered. Discussed proper shoes as well.    Discussed the etiology and treatment options for the condition in detail with the patient. Educated patient on the topical and oral treatment options for mycotic nails. Recommended debridement of the nails today. Sharp and mechanical debridement performed of all painful and mycotic nails today. Nails debrided in length and thickness using a nail nipper to level of comfort. Discussed treatment options including appropriate shoe gear. Follow up as needed for painful nails.     Return in about 3 months (around 09/12/2021) for at risk diabetic foot care.

## 2021-07-01 ENCOUNTER — Encounter (HOSPITAL_BASED_OUTPATIENT_CLINIC_OR_DEPARTMENT_OTHER): Payer: Self-pay | Admitting: Internal Medicine

## 2021-07-22 NOTE — Progress Notes (Addendum)
Jeremy Clark, Jeremy Clark ([id number]) Visit Report for 07/23/2021 Allergy List Details Patient Name: Date of Service: Jeremy Clark, Jeremy Clark 07/23/2021 8:30 A M Medical Record Number: [id number] Patient Account Number: [id number] Date of Birth/Sex: Treating RN: 06/05/1965 (56 y.o. Male) Primary Care Jobeth Pangilinan: Melissa Montane Other Clinician: Referring Donica Derouin: Treating Aleya Durnell/Extender: Kalman Shan Ellison Hughs Weeks in Treatment: 0 Allergies Active Allergies doxycycline Reaction: Rash Allergy Notes Electronic Signature(s) Signed: 07/28/2021 3:37:08 PM By: Rhae Hammock RN Previous Signature: 07/22/2021 3:37:46 PM Version By: Valeria Batman EMT Entered By: Rhae Hammock on 07/23/2021 08:49:10 -------------------------------------------------------------------------------- [facility] Details Patient Name: Date of Service: Jeremy Clark. 07/23/2021 8:30 A M Medical Record Number: [id number] Patient Account Number: [id number] Date of Birth/Sex: Treating RN: [birth year] (56 y.o. Male) Rhae Hammock Primary Care Glennis Montenegro: Melissa Montane Other Clinician: Referring Lashana Spang: Treating Shirleymae Hauth/Extender: Kalman Shan Merleen Milliner in Treatment: 0 Visit Information Patient Arrived: Ambulatory Arrival Time: 08:44 Accompanied By: self Transfer Assistance: None Patient Identification Verified: Yes Secondary Verification Process Completed: Yes Patient Requires Transmission-Based Precautions: No Patient Has Alerts: No Electronic Signature(s) Signed: 07/28/2021 3:37:08 PM By: Rhae Hammock RN Entered By: Rhae Hammock on 07/23/2021 08:47:13 -------------------------------------------------------------------------------- Clinic Level of Care Assessment Details Patient Name: Date of Service: Jeremy Clark, Jeremy Clark 07/23/2021 8:30 A M Medical Record Number: [id number] Patient Account Number: [id number] Date of Birth/Sex: Treating RN: 1966-03-27 (56 y.o.  Male) Rhae Hammock Primary Care Mykenna Viele: Melissa Montane Other Clinician: Referring Libbie Bartley: Treating Lucette Kratz/Extender: Kalman Shan Ellison Hughs Weeks in Treatment: 0 Clinic Level of Care Assessment Items TOOL 4 Quantity Score X- 1 0 Use when only an EandM is performed on FOLLOW-UP visit ASSESSMENTS - Nursing Assessment / Reassessment X- 1 10 Reassessment of Co-morbidities (includes updates in patient status) X- 1 5 Reassessment of Adherence to Treatment Plan ASSESSMENTS - Wound and Skin A ssessment / Reassessment []  - 0 Simple Wound Assessment / Reassessment - one wound []  - 0 Complex Wound Assessment / Reassessment - multiple wounds []  - 0 Dermatologic / Skin Assessment (not related to wound area) ASSESSMENTS - Focused Assessment []  - 0 Circumferential Edema Measurements - multi extremities []  - 0 Nutritional Assessment / Counseling / Intervention []  - 0 Lower Extremity Assessment (monofilament, tuning fork, pulses) []  - 0 Peripheral Arterial Disease Assessment (using hand held doppler) ASSESSMENTS - Ostomy and/or Continence Assessment and Care []  - 0 Incontinence Assessment and Management []  - 0 Ostomy Care Assessment and Management (repouching, etc.) PROCESS - Coordination of Care X - Simple Patient / Family Education for ongoing care 1 15 []  - 0 Complex (extensive) Patient / Family Education for ongoing care X- 1 10 Staff obtains Programmer, systems, Records, T Results / Process Orders est []  - 0 Staff telephones HHA, Nursing Homes / Clarify orders / etc []  - 0 Routine Transfer to another Facility (non-emergent condition) []  - 0 Routine Hospital Admission (non-emergent condition) X- 1 15 New Admissions / Biomedical engineer / Ordering NPWT Apligraf, etc. , []  - 0 Emergency Hospital Admission (emergent condition) X- 1 10 Simple Discharge Coordination []  - 0 Complex (extensive) Discharge Coordination PROCESS - Special Needs []  -  0 Pediatric / Minor Patient Management []  - 0 Isolation Patient Management []  - 0 Hearing / Language / Visual special needs []  - 0 Assessment of Community assistance (transportation, D/C planning, etc.) []  - 0 Additional assistance / Altered mentation []  - 0 Support Surface(s) Assessment (bed, cushion, seat, etc.) INTERVENTIONS - Wound Cleansing / Measurement []  - 0 Simple Wound Cleansing -  one wound []  - 0 Complex Wound Cleansing - multiple wounds []  - 0 Wound Imaging (photographs - any number of wounds) []  - 0 Wound Tracing (instead of photographs) []  - 0 Simple Wound Measurement - one wound []  - 0 Complex Wound Measurement - multiple wounds INTERVENTIONS - Wound Dressings []  - 0 Small Wound Dressing one or multiple wounds []  - 0 Medium Wound Dressing one or multiple wounds []  - 0 Large Wound Dressing one or multiple wounds []  - 0 Application of Medications - topical []  - 0 Application of Medications - injection INTERVENTIONS - Miscellaneous []  - 0 External ear exam []  - 0 Specimen Collection (cultures, biopsies, blood, body fluids, etc.) []  - 0 Specimen(s) / Culture(s) sent or taken to Lab for analysis []  - 0 Patient Transfer (multiple staff / Civil Service fast streamer / Similar devices) []  - 0 Simple Staple / Suture removal (25 or less) []  - 0 Complex Staple / Suture removal (26 or more) []  - 0 Hypo / Hyperglycemic Management (close monitor of Blood Glucose) []  - 0 Ankle / Brachial Index (ABI) - do not check if billed separately X- 1 5 Vital Signs Has the patient been seen at the hospital within the last three years: Yes Total Score: 70 Level Of Care: New/Established - Level 2 Electronic Signature(s) Signed: 07/28/2021 3:37:08 PM By: Rhae Hammock RN Entered By: Rhae Hammock on 07/23/2021 09:22:56 -------------------------------------------------------------------------------- Encounter Discharge Information Details Patient Name: Date of Service: Jeremy Clark. 07/23/2021 8:30 A M Medical Record Number: [id number] Patient Account Number: [id number] Date of Birth/Sex: Treating RN: 1965-08-07 (56 y.o. Male) Rhae Hammock Primary Care Samanthan Dugo: Melissa Montane Other Clinician: Referring Parveen Freehling: Treating Avryl Roehm/Extender: Kalman Shan Ellison Hughs Weeks in Treatment: 0 Encounter Discharge Information Items Discharge Condition: Stable Ambulatory Status: Ambulatory Discharge Destination: Home Transportation: Private Auto Accompanied By: self Schedule Follow-up Appointment: Yes Clinical Summary of Care: Patient Declined Electronic Signature(s) Signed: 07/28/2021 3:37:08 PM By: Rhae Hammock RN Entered By: Rhae Hammock on 07/23/2021 09:23:52 -------------------------------------------------------------------------------- Lower Extremity Assessment Details Patient Name: Date of Service: Jeremy Clark 07/23/2021 8:30 A M Medical Record Number: [id number] Patient Account Number: [id number] Date of Birth/Sex: Treating RN: Feb 28, 1966 (55 y.o. Male) Rhae Hammock Primary Care Halli Equihua: Melissa Montane Other Clinician: Referring Mateus Rewerts: Treating Breion Novacek/Extender: Kalman Shan Ellison Hughs Weeks in Treatment: 0 Electronic Signature(s) Signed: 07/28/2021 3:37:08 PM By: Rhae Hammock RN Entered By: Rhae Hammock on 07/23/2021 08:53:37 -------------------------------------------------------------------------------- Multi Wound Chart Details Patient Name: Date of Service: Jeremy Clark. 07/23/2021 8:30 A M Medical Record Number: [id number] Patient Account Number: [id number] Date of Birth/Sex: Treating RN: 02-28-1966 (56 y.o. Male) Primary Care Danaye Sobh: Melissa Montane Other Clinician: Referring Khamila Bassinger: Treating Rachyl Wuebker/Extender: Kalman Shan Ellison Hughs Weeks in Treatment: 0 Vital Signs Height(in): 67 Capillary Blood Glucose(mg/dl): 103 Weight(lbs):  250 Pulse(bpm): 92 Body Mass Index(BMI): 39.2 Blood Pressure(mmHg): 165/88 Temperature(F): 98.9 Respiratory Rate(breaths/min): 17 Wound Assessments Treatment Notes Electronic Signature(s) Signed: 07/26/2021 11:09:59 AM By: Kalman Shan DO Entered By: Kalman Shan on 07/26/2021 10:52:36 -------------------------------------------------------------------------------- Multi-Disciplinary Care Plan Details Patient Name: Date of Service: Jeremy Clark. 07/23/2021 8:30 A M Medical Record Number: [id number] Patient Account Number: [id number] Date of Birth/Sex: Treating RN: 10/18/1965 (55 y.o. 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 Goals: Patient and/or family will be able to state/discuss factors appropriate to the management of their disease process during treatment Date Initiated: 07/23/2021 T arget Resolution Date: 08/05/2021 Goal Status: Active Patient will tolerate the hyperbaric oxygen therapy treatment Date Initiated: 07/23/2021 T arget Resolution Date: 08/06/2021 Goal Status: Active Patient will tolerate the internal climate of the chamber Date Initiated: 07/23/2021 T arget Resolution Date: 08/05/2021 Goal Status: Active Interventions: Administer a five (5) minute air break for patient if signs and symptoms of seizure appear and notify the hyperbaric physician Administer decongestants, per physician orders, prior to HBO2 Administer the correct therapeutic gas delivery based on the patients needs and limitations, per physician order Notes: Orientation to the Wound Care Program Nursing Diagnoses: Knowledge  deficit related to the wound healing center program Goals: Patient/caregiver will verbalize understanding of the [facility] Date Initiated: 07/23/2021 Target Resolution Date: 08/04/2021 Goal Status: Active Interventions: Provide education on orientation to the wound center Notes: Electronic Signature(s) Signed: 07/28/2021 3:37:08 PM By: Rhae Hammock RN Entered By: Rhae Hammock on 07/23/2021 09:21:30 -------------------------------------------------------------------------------- Pain Assessment Details Patient Name: Date of Service: Jeremy Clark 07/23/2021 8:30 A M Medical Record Number: [id number] Patient Account Number: [id number] Date of Birth/Sex: Treating RN: May 10, 1966 (55 y.o. Male) Rhae Hammock Primary Care Macey Wurtz: Melissa Montane Other Clinician: Referring Eniya Cannady: Treating Reagan Behlke/Extender: Kalman Shan Ellison Hughs Weeks in Treatment: 0 Active Problems Location of Pain Severity and Description of Pain Patient Has Paino No Site Locations Pain Management and Medication Current Pain Management: Electronic Signature(s) Signed: 07/28/2021 3:37:08 PM By: Rhae Hammock RN Entered By: Rhae Hammock on 07/23/2021 08:53:47 -------------------------------------------------------------------------------- Patient/Caregiver Education Details Patient Name: Date of Service: Jeremy Clark 2/24/2023andnbsp8:30 [place] Record Number: [id number] Patient Account Number: [id number] Date of Birth/Gender: Treating RN: [birth year] (56 y.o. Male) Rhae Hammock Primary Care Physician: Melissa Montane Other Clinician: Referring Physician: Treating Physician/Extender: Kalman Shan Merleen Milliner in Treatment: 0 Education Assessment Education Provided To: Patient Education Topics Provided [place]: o Methods: Explain/Verbal Responses: Reinforcements needed, State content  correctly Electronic Signature(s) Signed: 07/28/2021 3:37:08 PM By: Rhae Hammock RN Entered By: Rhae Hammock on 07/23/2021 09:21:40 -------------------------------------------------------------------------------- Vitals Details Patient Name: Date of Service: Jeremy Clark. 07/23/2021 8:30 A M Medical Record Number: [id number] Patient Account Number: [id number] Date of Birth/Sex: Treating RN: 03/12/1966 (55 y.o. Male) Rhae Hammock Primary Care Duff Pozzi: Melissa Montane Other Clinician: Referring Audley Hinojos: Treating Tyreece Gelles/Extender: Kalman Shan Ellison Hughs Weeks in Treatment: 0 Vital Signs Time Taken: 08:47 Temperature (F): 98.9 Height (in): 67 Pulse (bpm): 92 Source: Stated Respiratory Rate (breaths/min): 17 Weight (lbs): 250 Blood Pressure (mmHg): 165/88 Source: Stated Capillary Blood Glucose (mg/dl): 103 Body Mass Index (BMI): 39.2 Reference Range: 80 - 120 mg / dl Electronic Signature(s) Signed: 07/28/2021 3:37:08 PM By: Rhae Hammock RN Entered By: Rhae Hammock on 07/23/2021 08:49:03

## 2021-07-23 ENCOUNTER — Encounter (HOSPITAL_BASED_OUTPATIENT_CLINIC_OR_DEPARTMENT_OTHER): Payer: BC Managed Care – PPO | Attending: Internal Medicine | Admitting: Internal Medicine

## 2021-07-23 ENCOUNTER — Other Ambulatory Visit: Payer: Self-pay

## 2021-07-23 DIAGNOSIS — N3041 Irradiation cystitis with hematuria: Secondary | ICD-10-CM | POA: Diagnosis present

## 2021-07-23 DIAGNOSIS — Z7984 Long term (current) use of oral hypoglycemic drugs: Secondary | ICD-10-CM | POA: Insufficient documentation

## 2021-07-23 DIAGNOSIS — E119 Type 2 diabetes mellitus without complications: Secondary | ICD-10-CM | POA: Diagnosis not present

## 2021-07-23 DIAGNOSIS — Z8546 Personal history of malignant neoplasm of prostate: Secondary | ICD-10-CM | POA: Insufficient documentation

## 2021-07-23 DIAGNOSIS — Z923 Personal history of irradiation: Secondary | ICD-10-CM | POA: Insufficient documentation

## 2021-07-26 NOTE — Progress Notes (Signed)
ATWELL, MCDANEL ([id number]) Visit Report for 07/23/2021 Chief Complaint Document Details Patient Name: Date of Service: Jeremy Clark, Jeremy Clark 07/23/2021 8:30 A M Medical Record Number: [id number] Patient Account Number: [id number] Date of Birth/Sex: Treating RN: 07-25-1965 (56 y.o. Male) Primary Care Provider: Melissa Montane Other Clinician: Referring Provider: Treating Provider/Extender: Kalman Shan Ellison Hughs Weeks in Treatment: 0 Information Obtained from: Patient Chief Complaint Here to discuss HBO therapy for treatment of Hematuria following radiation for prostate cancer Electronic Signature(s) Signed: 07/26/2021 11:09:59 AM By: Kalman Shan DO Entered By: Kalman Shan on 07/26/2021 10:53:30 -------------------------------------------------------------------------------- HPI Details Patient Name: Date of Service: Jeremy Clark. 07/23/2021 8:30 A M Medical Record Number: [id number] Patient Account Number: [id number] Date of Birth/Sex: Treating RN: 04/09/1966 (56 y.o. Male) Primary Care Provider: Melissa Montane Other Clinician: Referring Provider: Treating Provider/Extender: Kalman Shan Ellison Hughs Weeks in Treatment: 0 History of Present Illness HPI Description: 07/23/2021 Mr. Romari Gasparro is a 56 year old male with a past medical history of prostate cancer status postradiation, type 2 diabetes currently controlled on Ozempic, essential hypertension the presents to the clinic for discussion of HBO therapy. Patient is currently experiencing hematuria. He had grade 2 prostate cancer diagnosed on 03/26/2020. He is s/p XRT completed in 09/2020. He had 6 weeks of radiation from 09/03/2020 - 10/14/2020 and treated to 70 GY in 28 fractions of 2.5GY. He started developing gross hematuria in November 2022. 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 He currently denies signs of infection. He denies dysuria. He does have  increased frequency in urination. Electronic Signature(s) Signed: 07/26/2021 11:09:59 AM By: Kalman Shan DO Entered By: Kalman Shan on 07/26/2021 11:01:45 -------------------------------------------------------------------------------- Physical Exam Details Patient Name: Date of Service: Jeremy Clark, Jeremy Clark 07/23/2021 8:30 A M Medical Record Number: [id number] Patient Account Number: [id number] Date of Birth/Sex: Treating RN: 11-15-1965 (56 y.o. Male) Primary Care Provider: Melissa Montane Other Clinician: Referring Provider: Treating Provider/Extender: Kalman Shan Ellison Hughs Weeks in Treatment: 0 Constitutional respirations regular, non-labored and within target range for patient.Marland Kitchen Respiratory clear to auscultation bilaterally. Cardiovascular regular rate and rhythm with normal S1, S2. Psychiatric pleasant and cooperative. Electronic Signature(s) Signed: 07/26/2021 11:09:59 AM By: Kalman Shan DO Entered By: Kalman Shan on 07/26/2021 11:02:01 -------------------------------------------------------------------------------- Physician Orders Details Patient Name: Date of Service: Orion Modest. 07/23/2021 8:30 A M Medical Record Number: [id number] Patient Account Number: [id number] Date of Birth/Sex: Treating RN: 1966-01-27 (55 y.o. Male) Rhae Hammock Primary Care Provider: Melissa Montane Other Clinician: Referring Provider: Treating Provider/Extender: Kalman Shan Ellison Hughs Weeks in Treatment: 0 Verbal / Phone Orders: No Diagnosis Coding Follow-up Appointments Other: - We will call you once we hear back from your insurance company. Hyperbaric Oxygen Therapy Evaluate for HBO Therapy - RAdiation Cystitis Indication: - Radiation cystitis w/ hematuria If appropriate for treatment, begin HBOT per protocol: 2.0 ATA for 90 Minutes with 2 Five (5) Minute A Breaks ir Total Number of Treatments: - 40 One treatments per day  (delivered Monday through Friday unless otherwise specified in Special Instructions below): Finger stick Blood Glucose Pre- and Post- HBOT Treatment. Follow Hyperbaric Oxygen Glycemia Protocol A frin (Oxymetazoline HCL) 0.05% nasal spray - 1 spray in both nostrils daily as needed prior to HBO treatment for difficulty clearing ears GLYCEMIA INTERVENTIONS PROTOCOL PRE-HBO GLYCEMIA INTERVENTIONS ACTION INTERVENTION Obtain pre-HBO capillary blood glucose (ensure 1 physician order is in chart). A. Notify HBO physician and await physician orders. 2 If result is 70 mg/dl or  below: B. If the result meets the hospital definition of a critical result, follow hospital policy. A. Give patient an 8 ounce Glucerna Shake, an 8 ounce Ensure, or 8 ounces of a Glucerna/Ensure equivalent dietary supplement*. B. Wait 30 minutes. If result is 71 mg/dl to 130 mg/dl: C. Retest patients capillary blood glucose (CBG). D. If result greater than or equal to 110 mg/dl, proceed with HBO. If result less than 110 mg/dl, notify HBO physician and consider holding HBO. If result is 131 mg/dl to 249 mg/dl: A. Proceed with HBO. A. Notify HBO physician and await physician orders. B. It is recommended to hold HBO and do If result is 250 mg/dl or greater: blood/urine ketone testing. C. If the result meets the hospital definition of a critical result, follow hospital policy. POST-HBO GLYCEMIA INTERVENTIONS ACTION INTERVENTION Obtain post HBO capillary blood glucose (ensure 1 physician order is in chart). A. Notify HBO physician and await physician orders. 2 If result is 70 mg/dl or below: B. If the result meets the hospital definition of a critical result, follow hospital policy. A. Give patient an 8 ounce Glucerna Shake, an 8 ounce Ensure, or 8 ounces of a Glucerna/Ensure equivalent dietary supplement*. B. Wait 15 minutes for symptoms of If result is 71 mg/dl to 100 mg/dl: hypoglycemia (i.e.  nervousness, anxiety, sweating, chills, clamminess, irritability, confusion, tachycardia or dizziness). C. If patient asymptomatic, discharge patient. If patient symptomatic, repeat capillary blood glucose (CBG) and notify HBO physician. If result is 101 mg/dl to 249 mg/dl: A. Discharge patient. A. Notify HBO physician and await physician orders. B. It is recommended to do blood/urine ketone If result is 250 mg/dl or greater: testing. C. If the result meets the hospital definition of a critical result, follow hospital policy. *Juice or candies are NOT equivalent products. If patient refuses the Glucerna or Ensure, please consult the hospital dietitian for an appropriate substitute. Electronic Signature(s) Signed: 07/26/2021 11:09:59 AM By: Kalman Shan DO Entered By: Kalman Shan on 07/26/2021 11:08:37 -------------------------------------------------------------------------------- Problem List Details Patient Name: Date of Service: Jeremy Clark 07/23/2021 8:30 A M Medical Record Number: [id number] Patient Account Number: [id number] Date of Birth/Sex: Treating RN: 11-14-1965 (56 y.o. Male) Primary Care Provider: Melissa Montane Other Clinician: Referring Provider: Treating Provider/Extender: Kalman Shan Ellison Hughs Weeks in Treatment: 0 Active Problems ICD-10 Encounter Code Description Active Date MDM Diagnosis N30.41 Irradiation cystitis with hematuria 07/23/2021 No Yes Inactive Problems Resolved Problems Electronic Signature(s) Signed: 07/26/2021 11:09:59 AM By: Kalman Shan DO Entered By: Kalman Shan on 07/26/2021 10:52:31 -------------------------------------------------------------------------------- Progress Note Details Patient Name: Date of Service: Jeremy Clark. 07/23/2021 8:30 A M Medical Record Number: [id number] Patient Account Number: [id number] Date of Birth/Sex: Treating RN: 1965-12-02 (56 y.o. Male) Primary Care  Provider: Melissa Montane Other Clinician: Referring Provider: Treating Provider/Extender: Kalman Shan Ellison Hughs Weeks in Treatment: 0 Subjective Chief Complaint Information obtained from Patient Here to discuss HBO therapy for treatment of Hematuria following radiation for prostate cancer History of Present Illness (HPI) 07/23/2021 Mr. Jeremy Clark is a 56 year old male with a past medical history of prostate cancer status postradiation, type 2 diabetes currently controlled on Ozempic, essential hypertension the presents to the clinic for discussion of HBO therapy. Patient is currently experiencing hematuria. He had grade 2 prostate cancer diagnosed on 03/26/2020. He is s/p XRT completed in 09/2020. He had 6 weeks of radiation from 09/03/2020 - 10/14/2020 and treated to 70 GY in 28 fractions of 2.5GY. He started developing gross hematuria in  November 2022. 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 He currently denies signs of infection. He denies dysuria. He does have increased frequency in urination. Patient History Allergies doxycycline (Reaction: Rash) Medical History Hematologic/Lymphatic Patient has history of Anemia Cardiovascular Patient has history of Hypertension Endocrine Patient has history of Type II Diabetes Medical A Surgical History Notes nd Constitutional Symptoms (General Health) Abdomen wall Hernia Elevated PSA Vitamin D deficiency Morbid Obesity Hematologic/Lymphatic Hyperlidemia Cardiovascular Acute Ischemic Stroke CVA Gastrointestinal Prostate Cancer Hematuria Objective Constitutional respirations regular, non-labored and within target range for patient.. Vitals Time Taken: 8:47 AM, Height: 67 in, Source: Stated, Weight: 250 lbs, Source: Stated, BMI: 39.2, Temperature: 98.9 F, Pulse: 92 bpm, Respiratory Rate: 17 breaths/min, Blood Pressure: 165/88 mmHg, Capillary Blood Glucose: 103  mg/dl. Respiratory clear to auscultation bilaterally. Cardiovascular regular rate and rhythm with normal S1, S2. Psychiatric pleasant and cooperative. Assessment Active Problems ICD-10 Irradiation cystitis with hematuria Patient presents for discussion of HBO therapy. He has a history of prostate cancer status post radiation treatment for 6 weeks. Following radiation therapy 6 months later he developed hematuria. He had a cystoscopy done 05/11/2021 that showed a postoperative diagnosis of radiation cystitis. We discussed the risks and benefits of HBO therapy. He had an EKG done on 08/07/2020 that showed normal sinus rhythm. He had a chest x-ray done on 05/09/2021 that showed normal heart size and mediastinal contours with both lungs cleared. There is no active disease. He would like to proceed with HBO therapy. 47 minutes was spent on the encounter including face-to-face, EMR review and coordination of care Plan Follow-up Appointments: Other: - We will call you once we hear back from your insurance company. Hyperbaric Oxygen Therapy: Evaluate for HBO Therapy - RAdiation Cystitis Indication: - Radiation cystitis w/ hematuria If appropriate for treatment, begin HBOT per protocol: 2.0 ATA for 90 Minutes with 2 Five (5) Minute Air Breaks T Number of Treatments: - 40 otal One treatments per day (delivered Monday through Friday unless otherwise specified in Special Instructions below): Finger stick Blood Glucose Pre- and Post- HBOT Treatment. Follow Hyperbaric Oxygen Glycemia Protocol Afrin (Oxymetazoline HCL) 0.05% nasal spray - 1 spray in both nostrils daily as needed prior to HBO treatment for difficulty clearing ears 1. Send approval for HBO therapy through Musician) Signed: 07/26/2021 11:09:59 AM By: Kalman Shan DO Entered By: Kalman Shan on 07/26/2021 11:09:06 -------------------------------------------------------------------------------- HxROS  Details Patient Name: Date of Service: Jeremy Clark. 07/23/2021 8:30 A M Medical Record Number: [id number] Patient Account Number: [id number] Date of Birth/Sex: Treating RN: Mar 31, 1966 (56 y.o. Male) Primary Care Provider: Melissa Montane Other Clinician: Referring Provider: Treating Provider/Extender: Kalman Shan Ellison Hughs Weeks in Treatment: 0 Constitutional Symptoms (General Health) Medical History: Past Medical History Notes: Abdomen wall Hernia Elevated PSA Vitamin D deficiency Morbid Obesity Hematologic/Lymphatic Medical History: Positive for: Anemia Past Medical History Notes: Hyperlidemia Cardiovascular Medical History: Positive for: Hypertension Past Medical History Notes: Acute Ischemic Stroke CVA Gastrointestinal Medical History: Past Medical History Notes: Prostate Cancer Hematuria Endocrine Medical History: Positive for: Type II Diabetes Immunizations Pneumococcal Vaccine: Received Pneumococcal Vaccination: Yes Received Pneumococcal Vaccination On or After 60th Birthday: No Tetanus Vaccine: Last tetanus shot: 02/28/2012 Implantable Devices No devices added Electronic Signature(s) Signed: 07/22/2021 3:37:46 PM By: Valeria Batman EMT Signed: 07/26/2021 11:09:59 AM By: Kalman Shan DO Entered By: Valeria Batman on 07/22/2021 11:28:13 -------------------------------------------------------------------------------- SuperBill Details Patient Name: Date of Service: Jeremy Clark 07/23/2021 Medical Record Number: [id number]  Patient Account Number: [id number] Date of Birth/Sex: Treating RN: 1965-12-10 (55 y.o. Male) Rhae Hammock Primary Care Provider: Melissa Montane Other Clinician: Referring Provider: Treating Provider/Extender: Kalman Shan Ellison Hughs Weeks in Treatment: 0 Diagnosis Coding ICD-10 Codes Code Description N30.41 Irradiation cystitis with hematuria Facility Procedures CPT4 Code:  [id number] Description: [id number] - WOUND CARE VISIT-LEV 2 EST PT Modifier: Quantity: 1 Physician Procedures : CPT4 Code Description Modifier [id number] - WC PHYS LEVEL 4 - NEW PT ICD-10 Diagnosis Description N30.41 Irradiation cystitis with hematuria Quantity: 1 Electronic Signature(s) Signed: 07/26/2021 11:09:59 AM By: Kalman Shan DO Entered By: Kalman Shan on 07/26/2021 11:09:35

## 2021-07-28 NOTE — Progress Notes (Signed)
Jeremy, Clark ([id number]) Visit Report for 07/23/2021 Abuse Risk Screen Details Patient Name: Date of Service: MINOR, [name] 07/23/2021 8:30 A M Medical Record Number: [id number] Patient Account Number: [id number] Date of Birth/Sex: Treating RN: [birth year] (56 y.o. Male) Rhae Hammock Primary Care Sohana Austell: Melissa Montane Other Clinician: Referring Jazlynn Nemetz: Treating Rees Matura/Extender: Kalman Shan Ellison Hughs Weeks in Treatment: 0 Abuse Risk Screen Items Answer ABUSE RISK SCREEN: Has anyone close to you tried to hurt or harm you recentlyo No Do you feel uncomfortable with anyone in your familyo No Has anyone forced you do things that you didnt want to doo No Electronic Signature(s) Signed: 07/28/2021 3:37:08 PM By: Rhae Hammock RN Entered By: Rhae Hammock on 07/23/2021 08:49:34 -------------------------------------------------------------------------------- Activities of Daily Living Details Patient Name: Date of Service: Jeremy Clark, Jeremy Clark 07/23/2021 8:30 A M Medical Record Number: [id number] Patient Account Number: [id number] Date of Birth/Sex: Treating RN: 1965/10/23 (56 y.o. Male) Rhae Hammock Primary Care Pranathi Winfree: Melissa Montane Other Clinician: Referring Kimba Lottes: Treating Rita Vialpando/Extender: Kalman Shan Ellison Hughs Weeks in Treatment: 0 Activities of Daily Living Items Answer Activities of Daily Living (Please select one for each item) Drive Automobile Completely Able T Medications ake Completely Able Use T elephone Completely Able Care for Appearance Completely Able Use T oilet Completely Able Bath / Shower Completely Able Dress Self Completely Able Feed Self Completely Able Walk Completely Able Get In / Out Bed Completely Able Housework Completely Able Prepare Meals Completely [place] for Self Completely Able Electronic Signature(s) Signed: 07/28/2021 3:37:08 PM By: Rhae Hammock RN Entered By: Rhae Hammock on 07/23/2021 08:50:00 -------------------------------------------------------------------------------- Education Screening Details Patient Name: Date of Service: Jeremy Samuella Bruin. 07/23/2021 8:30 A M Medical Record Number: [id number] Patient Account Number: [id number] Date of Birth/Sex: Treating RN: 1965-11-07 (55 y.o. Male) Rhae Hammock Primary Care Marvetta Vohs: Melissa Montane Other Clinician: Referring Tracey Stewart: Treating Anderson Middlebrooks/Extender: Kalman Shan Merleen Milliner in Treatment: 0 Primary Learner Assessed: Patient Learning Preferences/Education Level/Primary Language Learning Preference: Explanation, Demonstration, Communication Board, Printed Material Highest Education Level: College or Above Preferred Language: English Cognitive Barrier Language Barrier: No Translator Needed: No Memory Deficit: No Emotional Barrier: No Cultural/Religious Beliefs Affecting Medical Care: No Physical Barrier Impaired Vision: Yes Glasses Impaired Hearing: No Decreased Hand dexterity: No Knowledge/Comprehension Knowledge Level: High Comprehension Level: High Ability to understand written instructions: High Ability to understand verbal instructions: High Motivation Anxiety Level: Calm Cooperation: Cooperative Education Importance: Denies Need Interest in Health Problems: Asks Questions Perception: Coherent Willingness to Engage in Self-Management High Activities: Readiness to Engage in Self-Management High Activities: Electronic Signature(s) Signed: 07/28/2021 3:37:08 PM By: Rhae Hammock RN Entered By: Rhae Hammock on 07/23/2021 08:51:16 -------------------------------------------------------------------------------- Fall Risk Assessment Details Patient Name: Date of Service: Jeremy Samuella Bruin. 07/23/2021 8:30 A M Medical Record Number: [id number] Patient Account Number: [id number] Date of Birth/Sex: Treating  RN: 04-01-1966 (55 y.o. Male) Rhae Hammock Primary Care Mackson Botz: Melissa Montane Other Clinician: Referring Alim Cattell: Treating Sahas Sluka/Extender: Kalman Shan Ellison Hughs Weeks in Treatment: 0 Fall Risk Assessment Items Have you had 2 or more falls in the last 12 monthso 0 No Have you had any fall that resulted in injury in the last 12 monthso 0 No FALLS RISK SCREEN History of falling - immediate or within 3 months 0 No Secondary diagnosis (Do you have 2 or more medical diagnoseso) 0 No Ambulatory aid None/bed rest/wheelchair/nurse 0 No Crutches/cane/walker 0 No Furniture 0 No Intravenous therapy Access/Saline/Heparin Lock 0 No Gait/Transferring Normal/  bed rest/ wheelchair 0 No Weak (short steps with or without shuffle, stooped but able to lift head while walking, may seek 0 No support from furniture) Impaired (short steps with shuffle, may have difficulty arising from chair, head down, impaired 0 No balance) Mental Status Oriented to own ability 0 No Electronic Signature(s) Signed: 07/28/2021 3:37:08 PM By: Rhae Hammock RN Entered By: Rhae Hammock on 07/23/2021 08:51:23 -------------------------------------------------------------------------------- Foot Assessment Details Patient Name: Date of Service: Jeremy Samuella Bruin. 07/23/2021 8:30 A M Medical Record Number: [id number] Patient Account Number: [id number] Date of Birth/Sex: Treating RN: 28-Nov-1965 (55 y.o. Male) Rhae Hammock Primary Care Stefany Starace: Melissa Montane Other Clinician: Referring Davin Muramoto: Treating Pancho Rushing/Extender: Kalman Shan Ellison Hughs Weeks in Treatment: 0 Foot Assessment Items Site Locations + = Sensation present, - = Sensation absent, C = Callus, U = Ulcer R = Redness, W = Warmth, M = Maceration, PU = Pre-ulcerative lesion F = Fissure, S = Swelling, D = Dryness Assessment Right: Left: Other Deformity: No No Prior Foot Ulcer: No No Prior Amputation:  No No Charcot Joint: No No Ambulatory Status: Gait: Electronic Signature(s) Signed: 07/28/2021 3:37:08 PM By: Rhae Hammock RN Entered By: Rhae Hammock on 07/23/2021 08:53:28 -------------------------------------------------------------------------------- Nutrition Risk Screening Details Patient Name: Date of Service: Jeremy Clark, Jeremy Clark 07/23/2021 8:30 A M Medical Record Number: [id number] Patient Account Number: [id number] Date of Birth/Sex: Treating RN: February 07, 1966 (55 y.o. Male) Rhae Hammock Primary Care Terriona Horlacher: Melissa Montane Other Clinician: Referring Raegyn Renda: Treating Desha Bitner/Extender: Kalman Shan Ellison Hughs Weeks in Treatment: 0 Height (in): 67 Weight (lbs): 250 Body Mass Index (BMI): 39.2 Nutrition Risk Screening Items Score Screening NUTRITION RISK SCREEN: I have an illness or condition that made me change the kind and/or amount of food I eat 0 No I eat fewer than two meals per day 0 No I eat few fruits and vegetables, or milk products 0 No I have three or more drinks of beer, liquor or wine almost every day 0 No I have tooth or mouth problems that make it hard for me to eat 0 No I don't always have enough money to buy the food I need 0 No I eat alone most of the time 0 No I take three or more different prescribed or over-the-counter drugs a day 0 No Without wanting to, I have lost or gained 10 pounds in the last six months 0 No I am not always physically able to shop, cook and/or feed myself 0 No Nutrition Protocols Good Risk Protocol 0 No interventions needed Moderate Risk Protocol High Risk Proctocol Risk Level: Good Risk Score: 0 Electronic Signature(s) Signed: 07/28/2021 3:37:08 PM By: Rhae Hammock RN Entered By: Rhae Hammock on 07/23/2021 08:51:35

## 2021-08-11 NOTE — Progress Notes (Signed)
?[facility] ?[address] ?[place]. [place] [address] ?[phone] ? ?     STROKE FOLLOW UP NOTE ? ?Mr. Jeremy Clark ?Date of Birth:  10/27/1965 ?Medical Record Number:  [id number]  ? ?Reason for Referral: stroke follow up ? ? ? ?SUBJECTIVE: ? ? ?CHIEF COMPLAINT:  ?Chief Complaint  ?Patient presents with  ? Follow-up  ?  RM 3 alone ?Pt is well and stable, no new concerns   ? ? ?HPI:  ? ? ?Update 08/11/2021 JM: Returns for stroke follow-up after prior initial visit 5 months ago unaccompanied.  Stable from stroke standpoint reporting residual intermittent right hand numbness. Denies any residual leg numbness. No associated weakness.  Denies new stroke/TIA symptoms.  Continues to maintain all ADLs and IADLs independently.  Compliant on pravastatin, denies side effects. 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 Will have occasional hematuria currently. Closely being followed by urology. Blood pressure today 159/90 -routinely monitors at home and typically stable in the 130s/80s range.  Was recently seen by PCP for annual exam with completion of lab work with A1c 6.5 and LDL 94. No further concerns at this time. ? ? ? ? ?History provided for reference purposes only ?Initial visit 03/11/2021 JM: Being seen for initial hospital follow-up unaccompanied.  Overall doing well.  Reports residual right hand numbness and intermittent numbness in leg. Leg can feel stiff after sitting for too long.  Denies any associated pain or weakness. Working with therapy [facility] - has been gradually improving.  No new stroke symptoms.  Has returned back to all prior activities without difficulty. 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  Remains on DAPT despite 3-week recommendation but denies any side effects.  Currently on pravastatin 80 mg  daily without side effects. Glucose levels have been good with today's AM fasting level 106. Blood pressure today 129/80 - monitors at home and has been stable.  No further concerns at this time. ? ?Stroke admission 01/15/2021 ?Jeremy Clark is a 56 y.o. male with history of diabetes, hypertension, hyperlipidemia, morbid obesity, prostate cancer admitted on 01/15/2021 for right-sided weakness and the following.  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  CTA head and neck unremarkable.  A1c 6.8, LDL 82, EF 55 to 60%.  UDS negative. Etiology likely small vessel disease in the setting of uncontrolled risk factors.  Recommend aspirin 81 and Plavix 75 DAPT for 3 weeks and then Plavix alone.  Increase Zocor 20 to 40 but at d/c, changed to pravastatin 80 mg daily.  Discharged home in stable condition. ? ? ? ? ? ? ?PERTINENT IMAGING ? ?01/13/21 CT Head without contrast ?CT angio Head and Neck with contrast: ?IMPRESSION: ?CT head: ?1. Small foci of hypodensity within the left frontal lobe ?periventricular white matter and left temporoparietal ?periventricular white matter, likely reflecting acute/early subacute ?infarcts given the provided history (MCA vascular territory). ?2. Background mild chronic small vessel ischemic changes within the ?cerebral white matter. ?  ?CTA head: ?1. The common and internal carotid arteries are patent within the ?neck without stenosis or significant atherosclerotic disease. ?2. The vertebral arteries are developmentally diminutive, but patent ?throughout the neck. ?  ?CTA neck: ?No intracranial large vessel occlusion or proximal high-grade ?arterial stenosis is identified. Intracranial atherosclerotic ?disease, as described. ? ?  ?01/14/21 MRI Brain: ?Unable  to review but per report in care-everywhere, there is an approximately 15 mm acute infarct in the anterior left parietal white matter. 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  ?  ?01/16/21 Echocardiogram Complete   ?IMPRESSIONS  ? 1. Left ventricular ejection fraction, by estimation, is 55 to 60%. The  ?left ventricle has normal function. The left ventricle has no regional  ?wall motion abnormalities. There is mild left ventricular hypertrophy.  ?Left ventricular diastolic parameters  ?were normal.  ? 2. Right ventricular systolic function is normal. The right ventricular  ?size is normal. Tricuspid regurgitation signal is inadequate for assessing  ?PA pressure.  ? 3. The mitral valve is normal in structure. Trivial mitral valve  ?regurgitation. No evidence of mitral stenosis.  ? 4. The aortic valve is tricuspid. Aortic valve regurgitation is not  ?visualized. No aortic stenosis is present.  ? ? ? ? ?ROS:   ?14 system review of systems performed and negative with exception of those listed in HPI ? ?PMH:  ?Past Medical History:  ?Diagnosis Date  ? Abdominal hernia   ? Diabetes mellitus   ? High cholesterol   ? Hypertension   ? Prostate cancer ([place])   ? ? ?PSH:  ?Past Surgical History:  ?Procedure Laterality Date  ? CYSTOSCOPY WITH FULGERATION N/A 05/11/2021  ? Procedure: CYSTOSCOPY WITH CLOT EVACUATION;  Surgeon: Robley Fries, MD;  Location: [facility];  Service: Urology;  Laterality: N/A;  ? PROSTATE BIOPSY    ? SHOULDER SURGERY    ? right  ? ? ?Social History:  ?Social History  ? ?Socioeconomic History  ? Marital status: Widowed  ?  Spouse name: Not on file  ? Number of children: 2  ? Years of education: Not on file  ? Highest education level: Not on file  ?Occupational History  ? Occupation: Freight forwarder  ?  Comment: full time  ?Tobacco Use  ? Smoking status: Former  ?  Packs/day: 0.50  ?  Years: 20.00  ?  Pack years: 10.00  ?  Types: Cigarettes  ?  Quit date: 05/31/2003  ?  Years since quitting: 18.2  ? Smokeless tobacco: Never  ?Vaping Use  ? Vaping Use: Never used  ?Substance and Sexual Activity  ? Alcohol use: Yes  ?  Comment: occ  ? Drug use: No  ? Sexual activity: Not on file  ?Other Topics Concern  ? Not on file  ?Social  History Narrative  ? Not on file  ? ?Social Determinants of Health  ? ?Financial Resource Strain: Not on file  ?Food Insecurity: Not on file  ?Transportation Needs: Not on file  ?Physical Activity: Not on file  ?Stress: Not on file  ?Social Connections: Not on file  ?Intimate Partner Violence: Not on file  ? ? ?Family History:  ?Family History  ?Problem Relation Age of Onset  ? Hypertension Mother   ? Prostate cancer Father   ? Aneurysm Daughter   ? Colon cancer Neg Hx   ? Pancreatic cancer Neg Hx   ? Breast cancer Neg Hx   ? ? ?Medications:   ?Current Outpatient Medications on File Prior to Visit  ?Medication Sig Dispense Refill  ? alfuzosin (UROXATRAL) 10 MG 24 hr tablet Take 10 mg by mouth daily.    ? amLODipine (NORVASC) 5 MG tablet Take 5 mg by mouth 2 (two) times daily.    ? carvedilol (COREG) 12.5 MG tablet Take 12.5 mg by mouth 2 (two) times daily.    ?  cetirizine (ZYRTEC) 10 MG tablet Take 10 mg by mouth daily.    ? clopidogrel (PLAVIX) 75 MG tablet Take 1 tablet (75 mg total) by mouth daily. 30 tablet 1  ? ferrous sulfate 325 (65 FE) MG tablet Take 1 tablet (325 mg total) by mouth 3 (three) times daily with meals. 90 tablet 0  ? lisinopril (PRINIVIL,ZESTRIL) 30 MG tablet Take 30 mg by mouth daily.    ? OZEMPIC, 2 MG/DOSE, 8 MG/3ML SOPN Inject 2 mg into the skin every Monday.    ? pravastatin (PRAVACHOL) 80 MG tablet Take 1 tablet (80 mg total) by mouth daily at 6 PM. 30 tablet 1  ? XIGDUO XR 09-998 MG TB24 Take 1 tablet by mouth 2 (two) times daily.    ? ?No current facility-administered medications on file prior to visit.  ? ? ?Allergies:   ?Allergies  ?Allergen Reactions  ? Doxycycline Rash  ? ? ? ? ?OBJECTIVE: ? ?Physical Exam ? ?Vitals:  ? 08/12/21 0837  ?BP: (!) 159/90  ?Pulse: 80  ?Weight: (!) 327 lb (148.3 kg)  ?Height: '5\' 4"'$  (1.626 m)  ? ? ?Body mass index is 56.13 kg/m?Marland Kitchen ?No results found. ? ?General: Morbidly obese very pleasant middle-aged African-American male, seated, in no evident  distress ?Head: head normocephalic and atraumatic.   ?Neck: supple with no carotid or supraclavicular bruits ?Cardiovascular: regular rate and rhythm, no murmurs ?Musculoskeletal: no deformity ?Skin:  no rash/p

## 2021-08-12 ENCOUNTER — Ambulatory Visit: Payer: BC Managed Care – PPO | Admitting: Adult Health

## 2021-08-12 ENCOUNTER — Encounter: Payer: Self-pay | Admitting: Adult Health

## 2021-08-12 ENCOUNTER — Other Ambulatory Visit: Payer: Self-pay

## 2021-08-12 VITALS — BP 159/90 | HR 80 | Ht 64.0 in | Wt 327.0 lb

## 2021-08-12 DIAGNOSIS — I639 Cerebral infarction, unspecified: Secondary | ICD-10-CM | POA: Diagnosis not present

## 2021-08-12 DIAGNOSIS — R310 Gross hematuria: Secondary | ICD-10-CM | POA: Diagnosis not present

## 2021-08-12 MED ORDER — ROSUVASTATIN CALCIUM 20 MG PO TABS: 20.0000 mg | ORAL_TABLET | Freq: Every day | ORAL | 5 refills | Status: AC

## 2021-08-12 NOTE — Patient Instructions (Addendum)
Stop pravastatin and start Crestor '20mg'$  daily for secondary stroke prevention as your recent LDL or bad cholesterol is at 94 with goal of less than 70 ? ?Restart plavix once cleared by your urologist for secondary stroke prevention  ? ?Continue to follow up with PCP regarding cholesterol, blood pressure and diabetes management  ?Maintain strict control of hypertension with blood pressure goal below 130/90, diabetes with hemoglobin A1c goal below 7.0 % and cholesterol with LDL cholesterol (bad cholesterol) goal below 70 mg/dL.  ? ?Signs of a Stroke? Follow the BEFAST method:  ?Balance Watch for a sudden loss of balance, trouble with coordination or vertigo ?Eyes Is there a sudden loss of vision in one or both eyes? Or double vision?  ?Face: Ask the person to smile. Does one side of the face droop or is it numb?  ?Arms: Ask the person to raise both arms. Does one arm drift downward? Is there weakness or numbness of a leg? ?Speech: Ask the person to repeat a simple phrase. Does the speech sound slurred/strange? Is the person confused ? ?Time: If you observe any of these signs, call 911. ? ? ? ?Continue to follow with your PCP for aggressive stroke risk factor management - can follow up here as needed ? ? ? ? ?Thank you for coming to see [place] at [facility]. I hope we have been able to provide you high quality care today. ? ?You may receive a patient satisfaction survey over the next few weeks. We would appreciate your feedback and comments so that we may continue to improve ourselves and the health of our patients. ? ? ? ?No further concerns at this time.  Requiring prolonged hospitalizations in December management of Crestor.  Statin order entry, due to symptomatic anemia stroke Prevention ?Some medical conditions and lifestyle choices can lead to a higher risk for a stroke. You can help to prevent a stroke by eating healthy foods and exercising. It also helps to not smoke and to manage any health  problems you may have. ?How can this condition affect me? ?A stroke is an emergency. It should be treated right away. A stroke can lead to brain damage or threaten your life. There is a better chance of surviving and getting better after a stroke if you get medical help right away. ?What can increase my risk? ?The following medical conditions may increase your risk of a stroke: ?Diseases of the heart and blood vessels (cardiovascular disease). ?High blood pressure (hypertension). ?Diabetes. ?High cholesterol. ?Sickle cell disease. ?Problems with blood clotting. ?Being very overweight. ?Sleeping problems (obstructivesleep apnea). ?Other risk factors include: ?Being older than age 54. ?A history of blood clots, stroke, or mini-stroke (TIA). ?Race, ethnic background, or a family history of stroke. ?Smoking or using tobacco products. ?Taking birth control pills, especially if you smoke. ?Heavy alcohol and drug use. ?Not being active. ?What actions can I take to prevent this? ?Manage your health conditions ?High cholesterol. ?Eat a healthy diet. If this is not enough to manage your cholesterol, you may need to take medicines. ?Take medicines as told by your doctor. ?High blood pressure. ?Try to keep your blood pressure below 130/80. ?If your blood pressure cannot be managed through a healthy diet and regular exercise, you may need to take medicines. ?Take medicines as told by your doctor. ?Ask your doctor if you should check your blood pressure at home. ?Have your blood pressure checked every year. ?Diabetes. ?Eat a healthy diet and get regular exercise. If your  blood sugar (glucose) cannot be managed through diet and exercise, you may need to take medicines. ?Take medicines as told by your doctor. ?Talk to your doctor about getting checked for sleeping problems. Signs of a problem can include: ?Snoring a lot. ?Feeling very tired. ?Make sure that you manage any other conditions you have. ?Nutrition ? ?Follow instructions  from your doctor about what to eat or drink. You may be told to: ?Eat and drink fewer calories each day. ?Limit how much salt (sodium) you use to 1,500 milligrams (mg) each day. ?Use only healthy fats for cooking, such as olive oil, canola oil, and sunflower oil. ?Eat healthy foods. To do this: ?Choose foods that are high in fiber. These include whole grains, and fresh fruits and vegetables. ?Eat at least 5 servings of fruits and vegetables a day. Try to fill one-half of your plate with fruits and vegetables at each meal. ?Choose low-fat (lean) proteins. These include low-fat cuts of meat, chicken without skin, fish, tofu, beans, and nuts. ?Eat low-fat dairy products. ?Avoid foods that: ?Are high in salt. ?Have saturated fat. ?Have trans fat. ?Have cholesterol. ?Are processed or pre-made. ?Count how many carbohydrates you eat and drink each day. ?Lifestyle ?If you drink alcohol: ?Limit how much you have to: ?0-1 drink a day for women who are not pregnant. ?0-2 drinks a day for men. ?Know how much alcohol is in your drink. In the [place] one drink equals one 12 oz bottle of beer (330m), one 5 oz glass of wine (1495m, or one 1? oz glass of hard liquor (4433m ?Do not smoke or use any products that have nicotine or tobacco. If you need help quitting, ask your doctor. ?Avoid secondhand smoke. ?Do not use drugs. ?Activity ? ?Try to stay at a healthy weight. ?Get at least 30 minutes of exercise on most days, such as: ?Fast walking. ?Biking. ?Swimming. ?Medicines ?Take over-the-counter and prescription medicines only as told by your doctor. ?Avoid taking birth control pills. Talk to your doctor about the risks of taking birth control pills if: ?You are over 35 43ars old. ?You smoke. ?You get very bad headaches. ?You have had a blood clot. ?Where to find more information ?American Stroke Association: www.strokeassociation.org ?Get help right away if: ?You or a loved one has any signs of a stroke. "BE FAST" is an easy way to  remember the warning signs: ?B - Balance. Dizziness, sudden trouble walking, or loss of balance. ?E - Eyes. Trouble seeing or a change in how you see. ?F - Face. Sudden weakness or loss of feeling of the face. The face or eyelid may droop on one side. ?A - Arms. Weakness or loss of feeling in an arm. This happens all of a sudden and most often on one side of the body. ?S - Speech. Sudden trouble speaking, slurred speech, or trouble understanding what people say. ?T - Time. Time to call emergency services. Write down what time symptoms started. ?You or a loved one has other signs of a stroke, such as: ?A sudden, very bad headache with no known cause. ?Feeling like you may vomit (nausea). ?Vomiting. ?A seizure. ?These symptoms may be an emergency. Get help right away. Call your local emergency services (911 in the [place] ?Do not wait to see if the symptoms will go away. ?Do not drive yourself to the hospital. ?Summary ?You can help to prevent a stroke by eating healthy, exercising, and not smoking. It also helps to manage any health problems you have. ?  Do not smoke or use any products that contain nicotine or tobacco. ?Get help right away if you or a loved one has any signs of a stroke. ?This information is not intended to replace advice given to you by your health care provider. Make sure you discuss any questions you have with your health care provider. ?Document Revised: 12/16/2019 Document Reviewed: 12/16/2019 ?Elsevier Patient Education ? [place]. ? ?

## 2021-09-20 ENCOUNTER — Ambulatory Visit: Payer: BC Managed Care – PPO | Admitting: Podiatry

## 2021-09-30 ENCOUNTER — Ambulatory Visit: Payer: BC Managed Care – PPO | Admitting: Podiatry

## 2022-08-17 IMAGING — DX DG CHEST 2V
2 series · 2 of 2 positions shown · non-contrast
Comparison: 08/06/2020

CLINICAL DATA: Sore throat

EXAM:
CHEST - 2 VIEW

[chest pa]
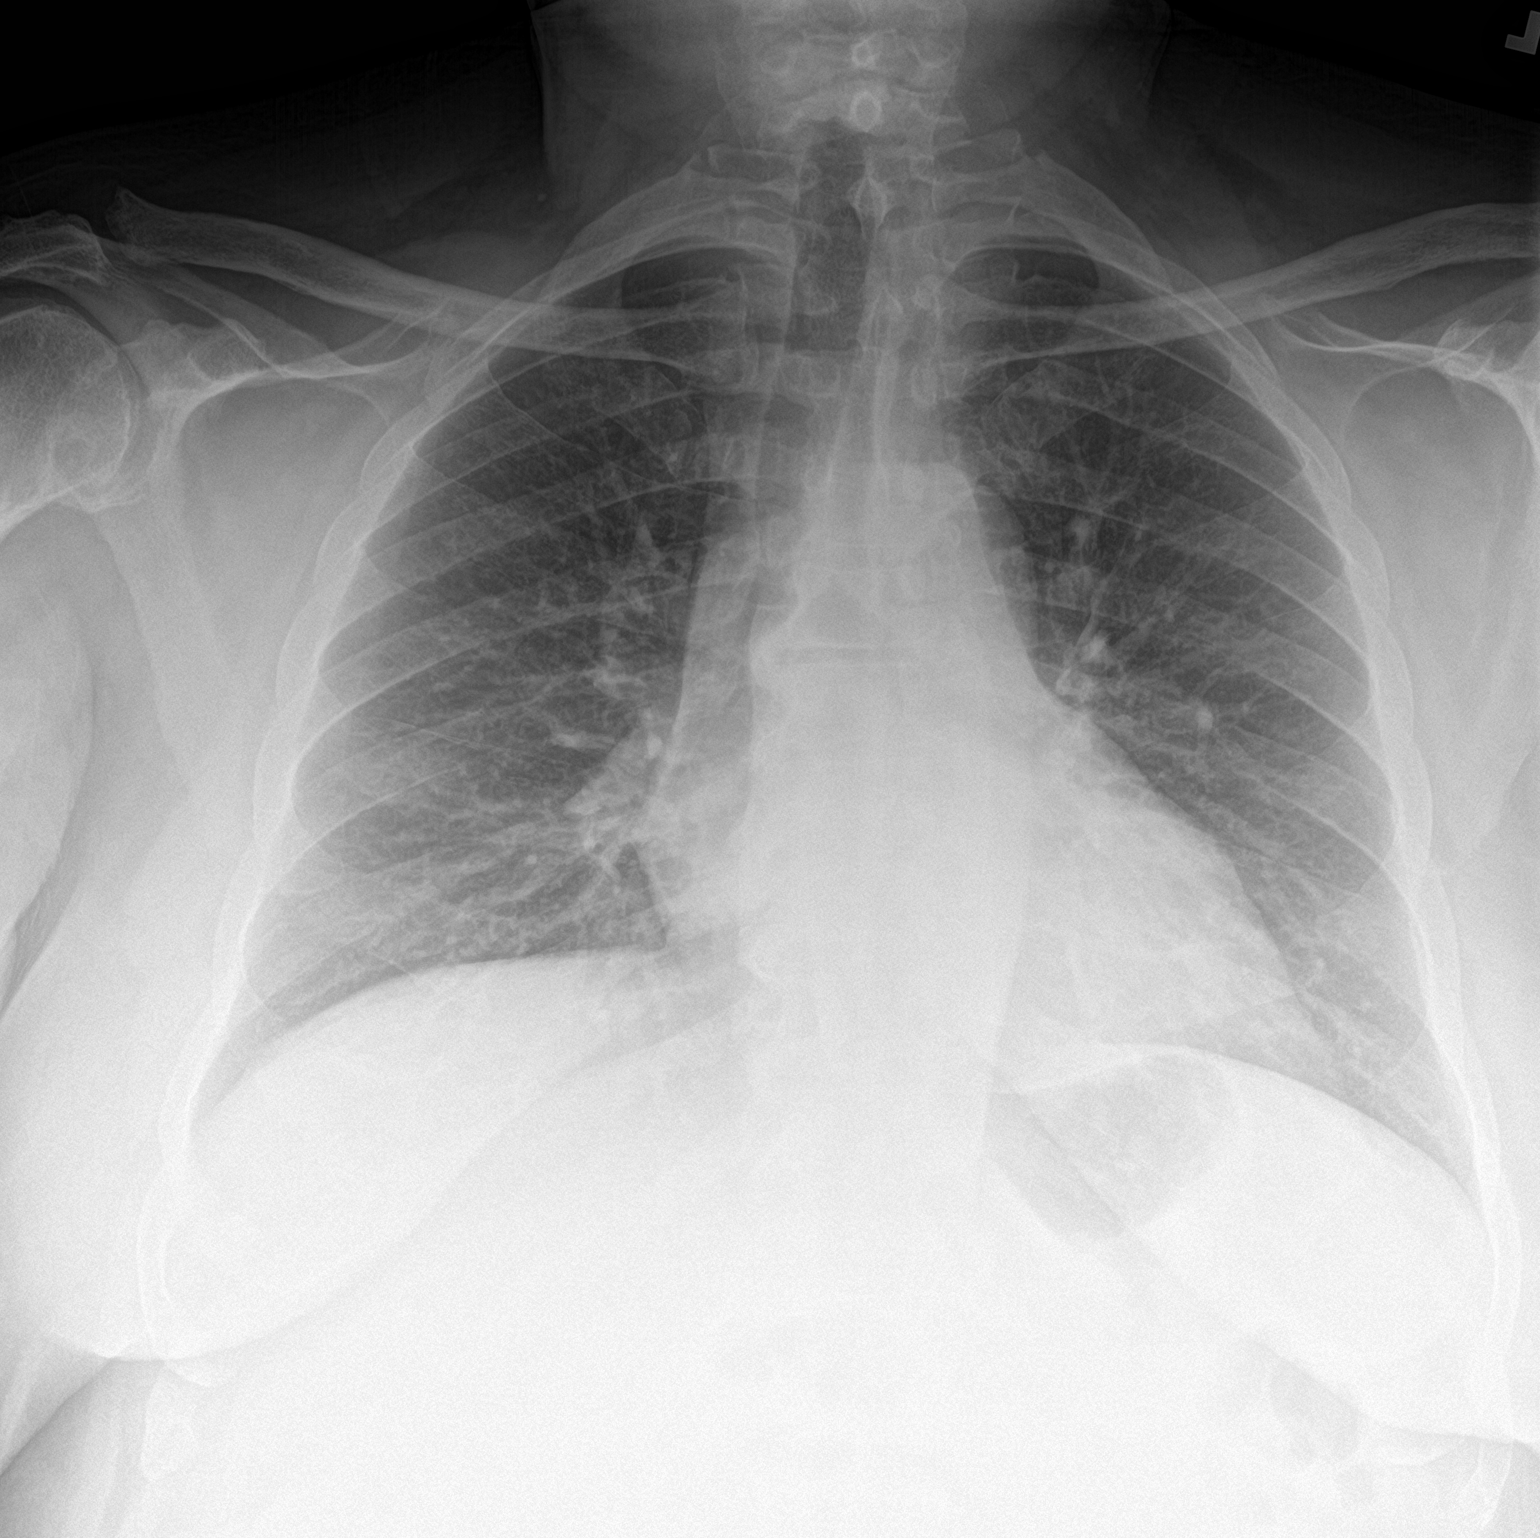

[chest lat]
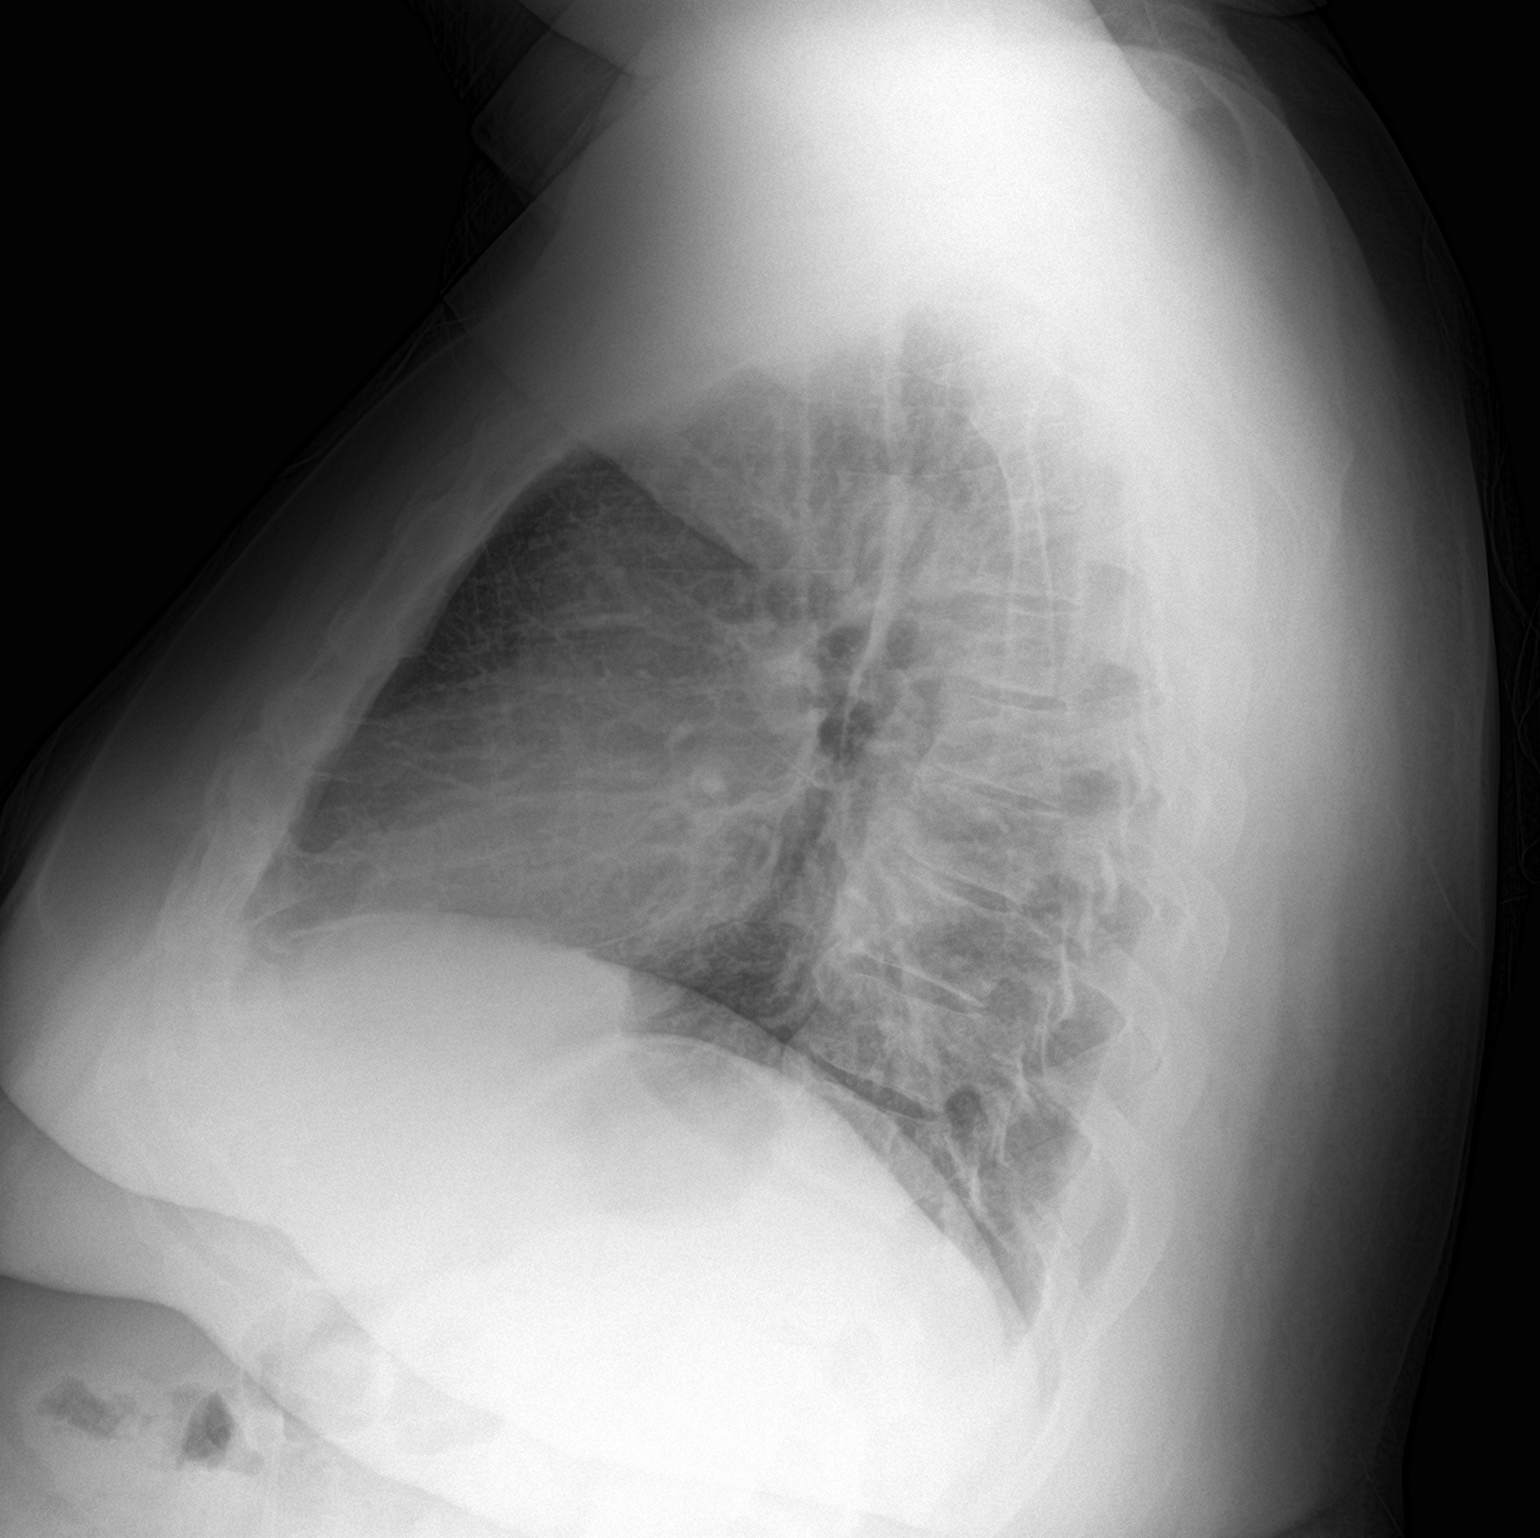

[2 of 2 positions shown; findings below may reference images not displayed]

FINDINGS: Normal heart size and mediastinal contours. Stable interstitial
markings. No acute infiltrate or edema. No effusion or pneumothorax.
No acute osseous findings.
IMPRESSION: No active cardiopulmonary disease.

## 2022-08-17 IMAGING — DX DG NECK SOFT TISSUE
2 series · 2 of 2 positions shown · non-contrast
Comparison: None.

CLINICAL DATA: Sore throat

EXAM:
NECK SOFT TISSUES - 1+ VIEW

[neck lat]
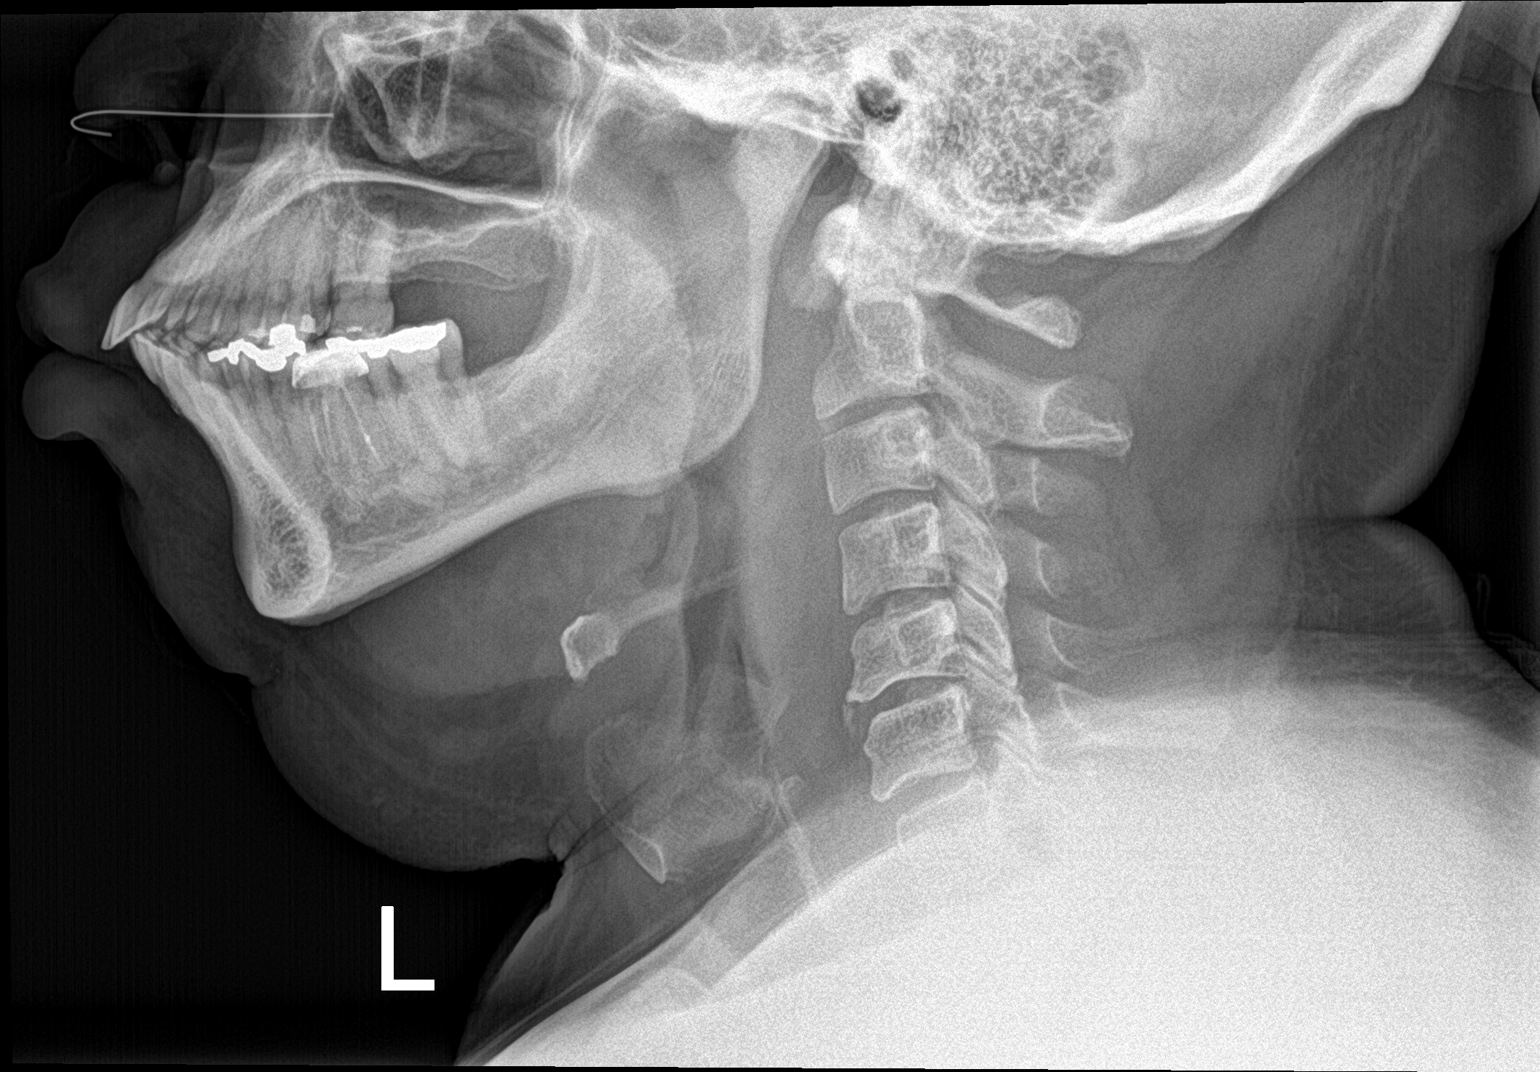

[neck ap]
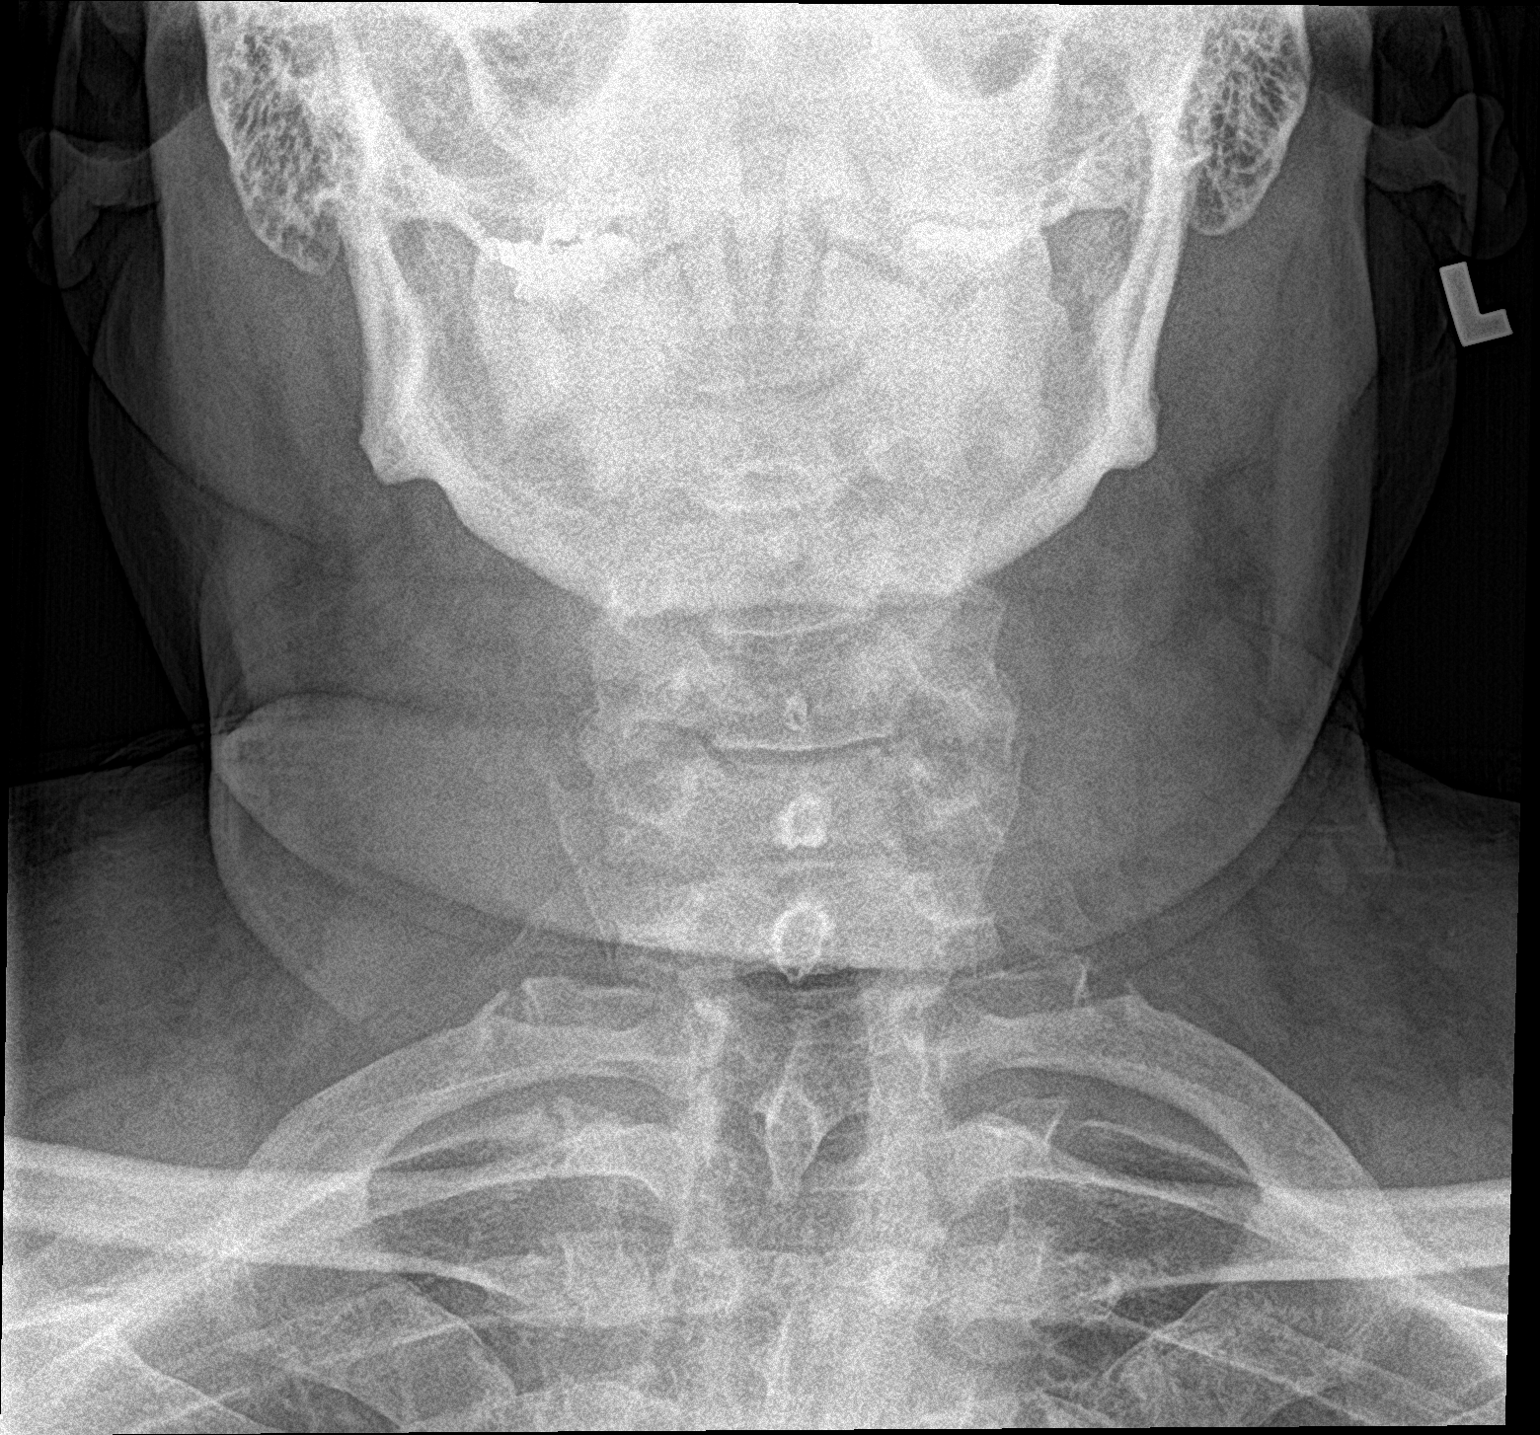

[2 of 2 positions shown; findings below may reference images not displayed]

FINDINGS: Prevertebral thickening to 16 mm. Normal appearance of the
epiglottis. No visible soft tissue gas or opaque foreign body.

Mild cervical disc degeneration inferiorly.
IMPRESSION: Prevertebral thickening, recommend neck CT with contrast.

## 2022-08-21 IMAGING — CT CT NECK W/ CM
4 series · 15 of 33 positions shown, 18 images · IV contrast (omnipaque)
Comparison: 09/18/2020

CLINICAL DATA: Sore throat with retropharyngeal effusion on CT,
follow-up post antibiotics

EXAM:
CT NECK WITH CONTRAST
TECHNIQUE: Multidetector CT imaging of the neck was performed using the
standard protocol following the bolus administration of intravenous
contrast.
CONTRAST:  75mL OMNIPAQUE IOHEXOL 300 MG/ML  SOLN

[Series 3: axial neck · axial · 0.49mm/px · z∈[-235,-83]mm · 5 of 115 slices shown, 7 images]
[im 20/115  soft-tissue]
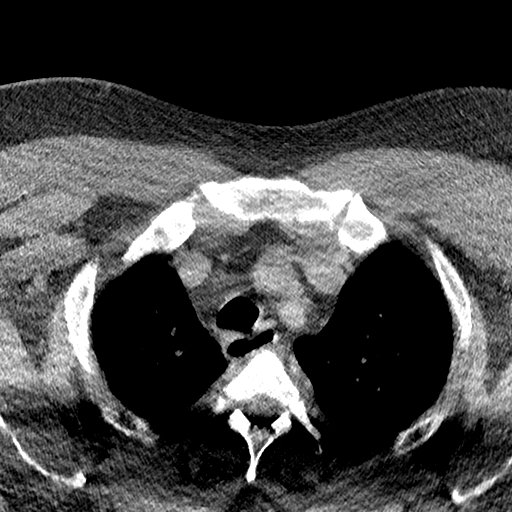
[im 20/115  bone]
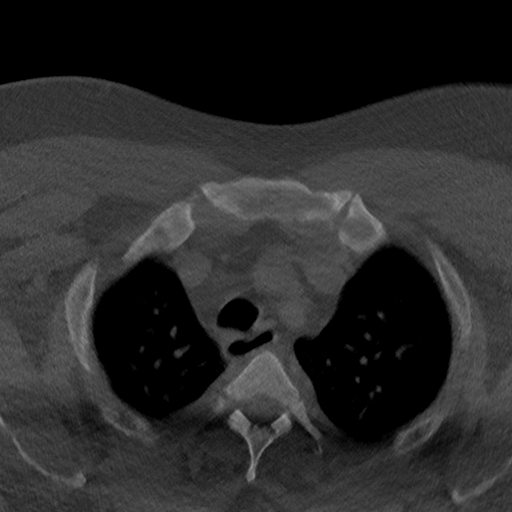
[im 39/115  bone]
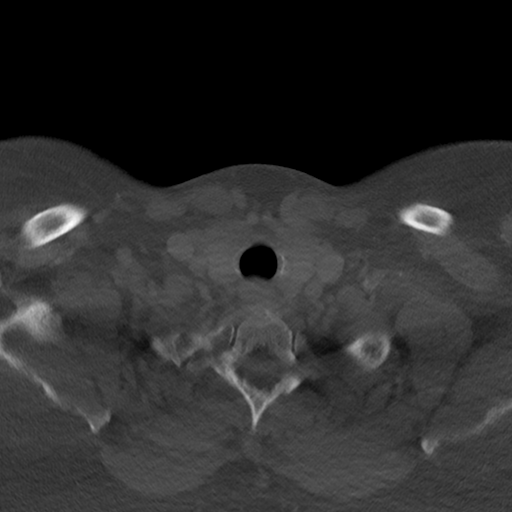
[im 58/115  bone]
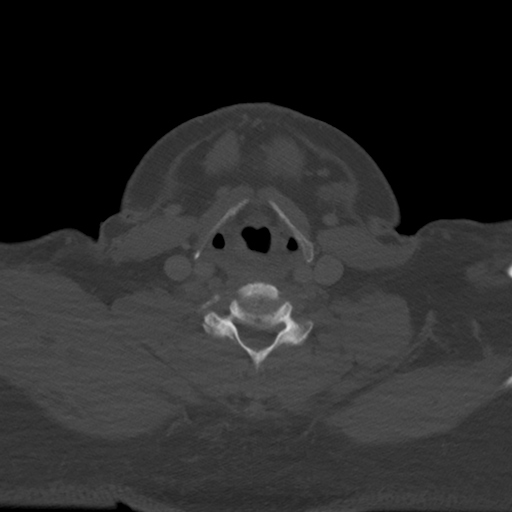
[im 77/115  bone]
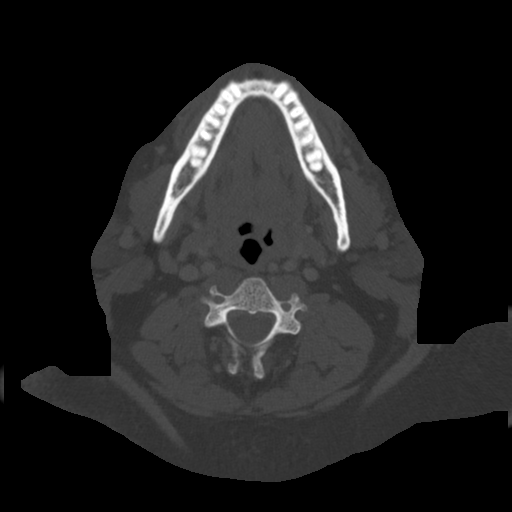
[im 96/115  soft-tissue]
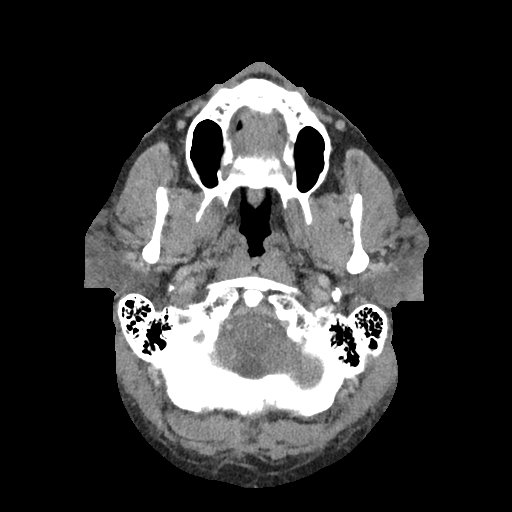
[im 96/115  bone]
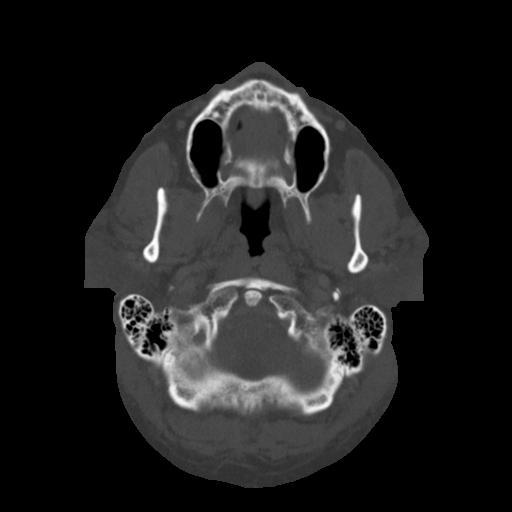

[Series 6: sag neck · sagittal · 0.49mm/px · 5 of 97 slices shown, 6 images]
[im 33/97  bone]
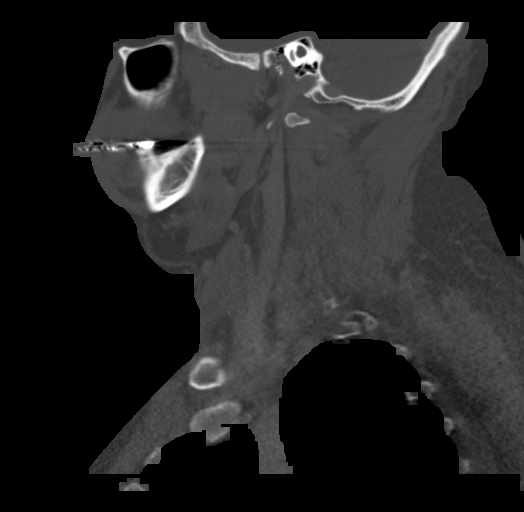
[im 41/97  bone]
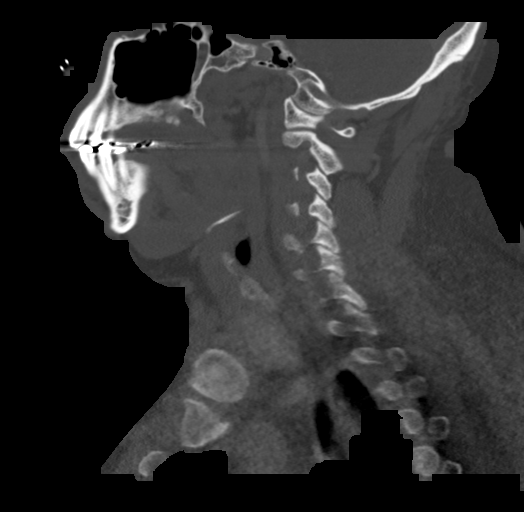
[im 49/97  soft-tissue]
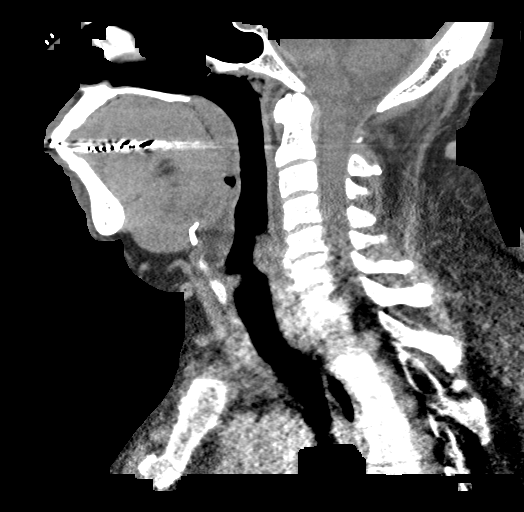
[im 49/97  bone]
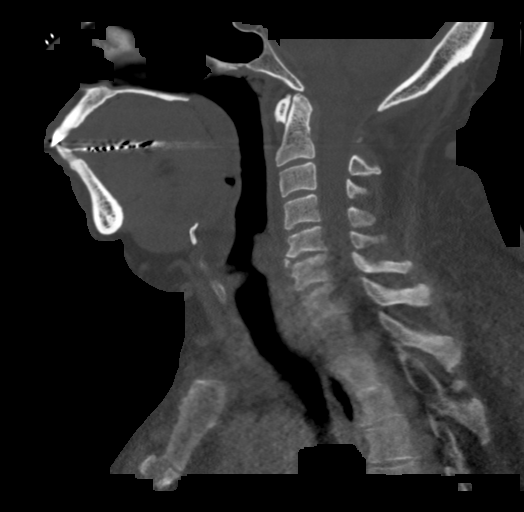
[im 57/97  bone]
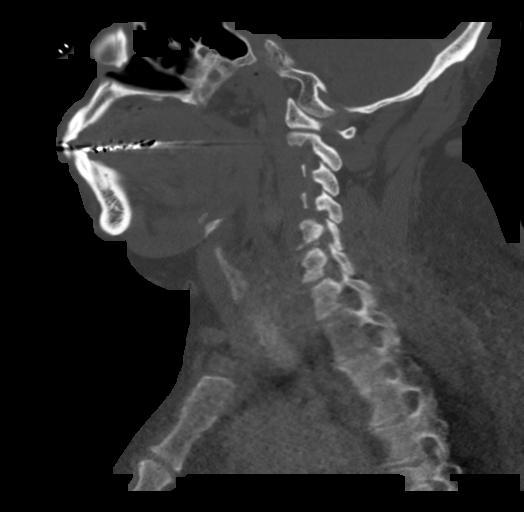
[im 65/97  bone]
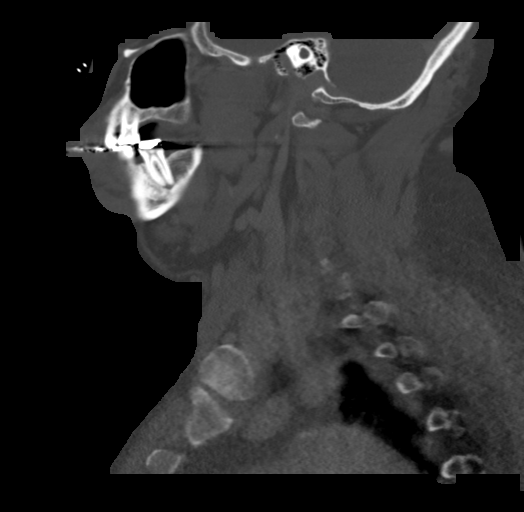

[Series 7: ax oropharynx · axial · 0.40mm/px · z∈[-263,-219]mm · 2 of 132 slices shown]
[im 22/132  bone]
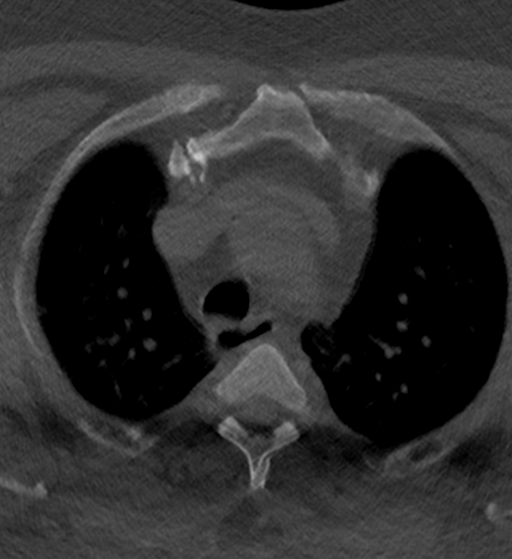
[im 44/132  bone]
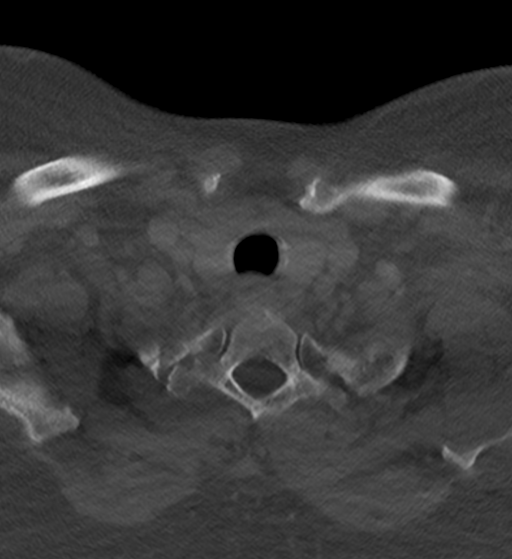

[Series 8: cor neck · coronal · 0.41mm/px · 3 of 119 slices shown]
[im 29/119  bone]
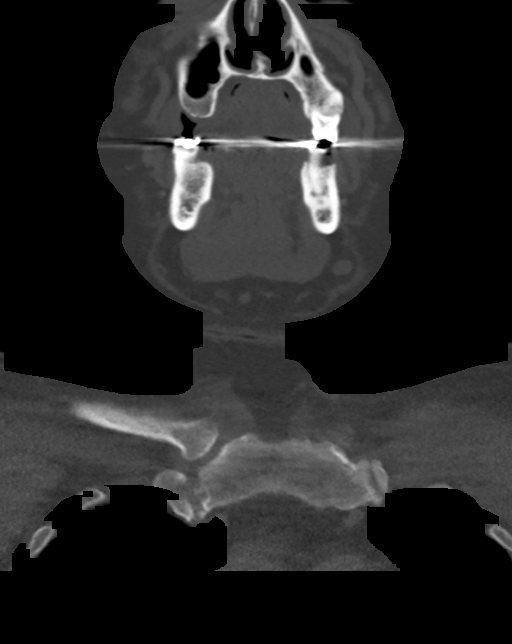
[im 49/119  bone]
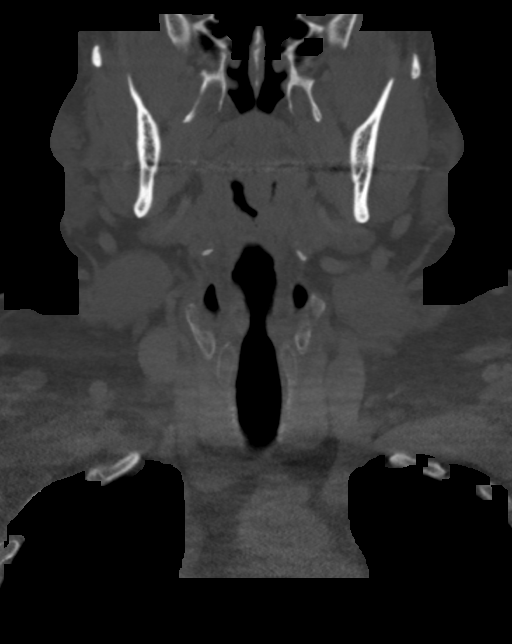
[im 70/119  bone]
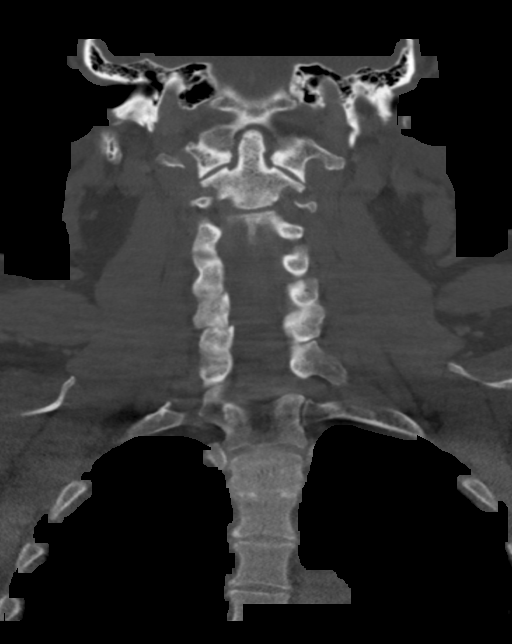

[15 of 33 positions shown; findings below may reference images not displayed]

FINDINGS: Pharynx and larynx: Substantially improved retropharyngeal effusion.
Decreased prominence of oropharyngeal and supraglottic larynx soft
tissues may reflect improved swelling.

Salivary glands: Parotid and submandibular glands are unremarkable.

Thyroid: Normal.

Lymph nodes: No enlarged or abnormal density nodes.

Vascular: Major neck vessels are patent. Partially retropharyngeal
course of the ICAs.

Limited intracranial: No abnormal enhancement.

Visualized orbits: Unremarkable.

Mastoids and visualized paranasal sinuses: No significant
opacification.

Skeleton: Minor cervical spine degenerative changes.

Upper chest: Included lungs are clear.

Other: None.
IMPRESSION: Substantial improvement in retropharyngeal effusion and probable
decreased swelling of pharyngeal soft tissues.

## 2023-03-06 ENCOUNTER — Ambulatory Visit: Payer: BC Managed Care – PPO | Admitting: Podiatry

## 2023-03-08 ENCOUNTER — Ambulatory Visit: Payer: BC Managed Care – PPO | Admitting: Podiatry

## 2023-03-08 DIAGNOSIS — M79674 Pain in right toe(s): Secondary | ICD-10-CM | POA: Diagnosis not present

## 2023-03-08 DIAGNOSIS — M79675 Pain in left toe(s): Secondary | ICD-10-CM | POA: Diagnosis not present

## 2023-03-08 DIAGNOSIS — B351 Tinea unguium: Secondary | ICD-10-CM

## 2023-03-08 NOTE — Progress Notes (Signed)
Subjective:  Patient ID: Jeremy Clark, male    DOB: 03/24/1966,  MRN: [id number]  Chief Complaint  Patient presents with   Diabetes    At risk diabetic footcare, blood sugar mains well-controlled has not had hernia addressed still    57 y.o. male returns with the above complaint. History confirmed with patient.  Nails are hurting again.  Previous debridements helpful  Objective:  Physical Exam: warm, good capillary refill, no trophic changes or ulcerative lesions, normal DP and PT pulses, normal sensory exam, onychomycosis x10 with subungual rate, yellow and brown elongated toenails with thinning of nail plates  Assessment:   1. Pain due to onychomycosis of toenails of both feet       Plan:  Patient was evaluated and treated and all questions answered.   Discussed the etiology and treatment options for the condition in detail with the patient. Recommended debridement of the nails today. Sharp and mechanical debridement performed of all painful and mycotic nails today. Nails debrided in length and thickness using a nail nipper to level of comfort. Discussed treatment options including appropriate shoe gear. Follow up as needed for painful nails.     Return in about 3 months (around 06/08/2023) for at risk diabetic foot care.

## 2023-05-29 ENCOUNTER — Ambulatory Visit: Payer: BC Managed Care – PPO | Admitting: Podiatry

## 2023-08-08 ENCOUNTER — Encounter: Payer: Self-pay | Admitting: Podiatry

## 2023-08-08 ENCOUNTER — Ambulatory Visit: Admitting: Podiatry

## 2023-08-08 DIAGNOSIS — B351 Tinea unguium: Secondary | ICD-10-CM

## 2023-08-08 DIAGNOSIS — M79674 Pain in right toe(s): Secondary | ICD-10-CM

## 2023-08-08 DIAGNOSIS — M79675 Pain in left toe(s): Secondary | ICD-10-CM

## 2023-08-09 NOTE — Progress Notes (Unsigned)
  Subjective:  Patient ID: Jeremy Clark, male    DOB: 1966/03/12,  MRN: [id number]  Chief Complaint  Patient presents with   Toe Pain    Patient states that he hit his 3rd toe and it started to bleed a little bit and patient just want doctor to check it out    58 y.o. male returns with the above complaint. History confirmed with patient.  Nails are hurting again.  Previous debridements helpful.  Otherwise doing well his blood sugar is well-controlled and he has been able to lose some weight.  Hoping to have his hernia fixed soon.  Objective:  Physical Exam: warm, good capillary refill, no trophic changes or ulcerative lesions, normal DP and PT pulses, normal sensory exam, onychomycosis x10 with subungual rate, yellow and brown elongated toenails with thinning of nail plates.  Right third toe has some bleeding no onycholysis or signs of infection  Assessment:   1. Pain due to onychomycosis of toenails of both feet       Plan:  Patient was evaluated and treated and all questions answered.   Discussed the etiology and treatment options for the condition in detail with the patient. Recommended debridement of the nails today. Sharp and mechanical debridement performed of all painful and mycotic nails today. Nails debrided in length and thickness using a nail nipper to level of comfort. Discussed treatment options including appropriate shoe gear. Follow up as needed for painful nails.  Third toenail had some bleeding but is well attached.  Discussed if becomes loose or shows any signs of infection to let me know for return for nail avulsion.   Return in about 3 months (around 11/08/2023) for at risk diabetic foot care.

## 2023-09-13 ENCOUNTER — Emergency Department (HOSPITAL_BASED_OUTPATIENT_CLINIC_OR_DEPARTMENT_OTHER)
Admission: EM | Admit: 2023-09-13 | Discharge: 2023-09-13 | Disposition: A | Discharge status: Home / Self Care | Attending: Emergency Medicine | Admitting: Emergency Medicine

## 2023-09-13 ENCOUNTER — Encounter (HOSPITAL_BASED_OUTPATIENT_CLINIC_OR_DEPARTMENT_OTHER): Payer: Self-pay

## 2023-09-13 ENCOUNTER — Other Ambulatory Visit: Payer: Self-pay

## 2023-09-13 DIAGNOSIS — E119 Type 2 diabetes mellitus without complications: Secondary | ICD-10-CM | POA: Diagnosis not present

## 2023-09-13 DIAGNOSIS — N289 Disorder of kidney and ureter, unspecified: Secondary | ICD-10-CM | POA: Insufficient documentation

## 2023-09-13 DIAGNOSIS — Z79899 Other long term (current) drug therapy: Secondary | ICD-10-CM | POA: Diagnosis not present

## 2023-09-13 DIAGNOSIS — Z8546 Personal history of malignant neoplasm of prostate: Secondary | ICD-10-CM | POA: Insufficient documentation

## 2023-09-13 DIAGNOSIS — I10 Essential (primary) hypertension: Secondary | ICD-10-CM | POA: Diagnosis not present

## 2023-09-13 DIAGNOSIS — T783XXA Angioneurotic edema, initial encounter: Secondary | ICD-10-CM | POA: Insufficient documentation

## 2023-09-13 DIAGNOSIS — E876 Hypokalemia: Secondary | ICD-10-CM | POA: Insufficient documentation

## 2023-09-13 DIAGNOSIS — R220 Localized swelling, mass and lump, head: Secondary | ICD-10-CM | POA: Diagnosis present

## 2023-09-13 LAB — BASIC METABOLIC PANEL WITH GFR
Anion gap: 14 (ref 5–15)
BUN: 23 mg/dL — ABNORMAL HIGH (ref 6–20)
CO2: 24 mmol/L (ref 22–32)
Calcium: 9.4 mg/dL (ref 8.9–10.3)
Chloride: 97 mmol/L — ABNORMAL LOW (ref 98–111)
Creatinine, Ser: 1.37 mg/dL — ABNORMAL HIGH (ref 0.61–1.24)
GFR, Estimated: >60 mL/min (ref >60)
Glucose, Bld: 158 mg/dL — ABNORMAL HIGH (ref 70–99)
Potassium: 3.3 mmol/L — ABNORMAL LOW (ref 3.5–5.1)
Sodium: 135 mmol/L (ref 135–145)

## 2023-09-13 LAB — CBC WITH DIFFERENTIAL/PLATELET
Abs Immature Granulocytes: 0.02 10*3/uL (ref 0.00–0.07)
Basophils Absolute: 0 10*3/uL (ref 0.0–0.1)
Basophils Relative: 0 %
Eosinophils Absolute: 0.1 10*3/uL (ref 0.0–0.5)
Eosinophils Relative: 1 %
HCT: 38 % — ABNORMAL LOW (ref 39.0–52.0)
Hemoglobin: 13.6 g/dL (ref 13.0–17.0)
Immature Granulocytes: 0 %
Lymphocytes Relative: 24 %
Lymphs Abs: 1.2 10*3/uL (ref 0.7–4.0)
MCH: 31.9 pg (ref 26.0–34.0)
MCHC: 35.8 g/dL (ref 30.0–36.0)
MCV: 89 fL (ref 80.0–100.0)
Monocytes Absolute: 0.8 10*3/uL (ref 0.1–1.0)
Monocytes Relative: 17 %
Neutro Abs: 2.8 10*3/uL (ref 1.7–7.7)
Neutrophils Relative %: 58 %
Platelets: 217 10*3/uL (ref 150–400)
RBC: 4.27 MIL/uL (ref 4.22–5.81)
RDW: 14.4 % (ref 11.5–15.5)
WBC: 4.8 10*3/uL (ref 4.0–10.5)
nRBC: 0 % (ref 0.0–0.2)

## 2023-09-13 MED ORDER — EPINEPHRINE 0.3 MG/0.3ML IJ SOAJ
0.3000 mg | Freq: Once | INTRAMUSCULAR | Status: AC
  Administered 2023-09-13: 0.3 mg via INTRAMUSCULAR
  Filled 2023-09-13: qty 0.3

## 2023-09-13 MED ORDER — METHYLPREDNISOLONE SODIUM SUCC 125 MG IJ SOLR
125.0000 mg | Freq: Once | INTRAMUSCULAR | Status: AC
  Administered 2023-09-13: 125 mg via INTRAVENOUS
  Filled 2023-09-13: qty 2

## 2023-09-13 MED ORDER — POTASSIUM CHLORIDE CRYS ER 20 MEQ PO TBCR
40.0000 meq | EXTENDED_RELEASE_TABLET | Freq: Once | ORAL | Status: AC
  Administered 2023-09-13: 40 meq via ORAL
  Filled 2023-09-13: qty 2

## 2023-09-13 MED ORDER — FAMOTIDINE IN NACL 20-0.9 MG/50ML-% IV SOLN
20.0000 mg | Freq: Once | INTRAVENOUS | Status: AC
  Administered 2023-09-13: 20 mg via INTRAVENOUS
  Filled 2023-09-13: qty 50

## 2023-09-13 MED ORDER — DIPHENHYDRAMINE HCL 50 MG/ML IJ SOLN
25.0000 mg | Freq: Once | INTRAMUSCULAR | Status: AC
  Administered 2023-09-13: 25 mg via INTRAVENOUS
  Filled 2023-09-13: qty 1

## 2023-09-13 NOTE — Discharge Instructions (Signed)
 The swelling of your lips is called angioedema.  It is most often caused by medications such as lisinopril.  Do not take lisinopril, any other ACE inhibitor, or any other ARB medication.  The rest of your medications are safe to take.  Please follow-up with your primary care doctor this week.  Immediately return to this emergency room or the closest emergency room if you start to develop swelling of your tongue, worsening swelling of your lips, or difficulty breathing or swallowing.

## 2023-09-13 NOTE — ED Provider Notes (Signed)
 [facility] Provider Note   CSN: [id number] Arrival date & time: 09/13/23  0541     History  Chief Complaint  Patient presents with   Angioedema    Jeremy Clark is a 58 y.o. male.  The history is provided by the patient.  He has history of hypertension, diabetes, hyperlipidemia, prostate cancer and comes in because of swelling of his lips and face which started last night before he went to bed but was much worse when he woke up this morning.  He did take a dose of diphenhydramine last night.  He denies any difficulty breathing or swallowing.  He does take lisinopril for blood pressure.   Home Medications Prior to Admission medications   Medication Sig Start Date End Date Taking? Authorizing Provider  alfuzosin (UROXATRAL) 10 MG 24 hr tablet Take 10 mg by mouth daily. 04/12/21   [provider]  amLODipine (NORVASC) 5 MG tablet Take 5 mg by mouth 2 (two) times daily. 07/31/20   [provider]  carvedilol (COREG) 12.5 MG tablet Take 12.5 mg by mouth 2 (two) times daily. 10/28/20   [provider]  cetirizine (ZYRTEC) 10 MG tablet Take 10 mg by mouth daily. 04/12/21   [provider]  ferrous sulfate 325 (65 FE) MG tablet Take 1 tablet (325 mg total) by mouth 3 (three) times daily with meals. 05/17/21   Pokhrel, Rebekah Chesterfield, MD  lisinopril (PRINIVIL,ZESTRIL) 30 MG tablet Take 30 mg by mouth daily.    [provider]  OZEMPIC, 2 MG/DOSE, 8 MG/3ML SOPN Inject 2 mg into the skin every Monday. 12/08/20   [provider]  rosuvastatin (CRESTOR) 20 MG tablet Take 1 tablet (20 mg total) by mouth daily. 08/12/21   Ihor Austin, NP  XIGDUO XR 09-998 MG TB24 Take 1 tablet by mouth 2 (two) times daily. 06/16/20   [provider]      Allergies    Doxycycline and Lisinopril    Review of Systems   Review of Systems  All other systems reviewed and are negative.   Physical Exam Updated Vital  Signs BP (!) 146/91   Pulse 86   Temp 97.8 F (36.6 C) (Oral)   Resp 20   SpO2 99%  Physical Exam Vitals and nursing note reviewed.   58 year old male, resting comfortably and in no acute distress. Vital signs are significant for elevated blood pressure. Oxygen saturation is 98%, which is normal. Head is normocephalic and atraumatic. PERRLA, EOMI. There is angioedema of upper and lower lips but no swelling noted of the tongue or sublingual tissues or uvula.  Phonation is normal and he has no pooling of secretions. Neck is nontender and supple without adenopathy. Lungs are clear without rales, wheezes, or rhonchi. Chest is nontender. Heart has regular rate and rhythm without murmur. Abdomen is soft, flat, nontender. Extremities have no cyanosis or edema. Skin is warm and dry without rash. Neurologic: Mental status is normal, moves all extremities equally.     ED Results / Procedures / Treatments   Labs (all labs ordered are listed, but only abnormal results are displayed) Labs Reviewed  BASIC METABOLIC PANEL WITH GFR - Abnormal; Notable for the following components:      Result Value   Potassium 3.3 (*)    Chloride 97 (*)    Glucose, Bld 158 (*)    BUN 23 (*)    Creatinine, Ser 1.37 (*)    All other  components within normal limits  CBC WITH DIFFERENTIAL/PLATELET - Abnormal; Notable for the following components:   HCT 38.0 (*)    All other components within normal limits  CBG MONITORING, ED    EKG None  Radiology No results found.  Procedures Procedures  Cardiac monitor shows normal sinus rhythm, per my interpretation.  Medications Ordered in ED Medications  potassium chloride SA (KLOR-CON M) CR tablet 40 mEq (has no administration in time range)  EPINEPHrine (EPI-PEN) injection 0.3 mg (0.3 mg Intramuscular Given 09/13/23 0605)  diphenhydrAMINE (BENADRYL) injection 25 mg (25 mg Intravenous Given 09/13/23 0609)  famotidine (PEPCID) IVPB 20 mg premix (20 mg  Intravenous New Bag/Given 09/13/23 4098)  methylPREDNISolone sodium succinate (SOLU-MEDROL) 125 mg/2 mL injection 125 mg (125 mg Intravenous Given 09/13/23 0606)    ED Course/ Medical Decision Making/ A&P                                 Medical Decision Making Amount and/or Complexity of Data Reviewed Labs: ordered.  Risk Prescription drug management.   Angioedema of the lips which is most likely secondary to ACE inhibitor use.  No evidence of airway obstruction.  I have ordered intramuscular epinephrine, intravenous diphenhydramine and famotidine and methylprednisolone.  6:42 AM As of now, the patient feels swelling has not changed.  I have reviewed hislaboratory results and my interpretation is normal CBC, mild hypokalemia, mild elevation of creatinine which is new compared with 09/06/2023.  I have ordered a dose of oral potassium.  Case is signed out to Dr. Florie Husband.  CRITICAL CARE Performed by: Alissa April Total critical care time: 45 minutes Critical care time was exclusive of separately billable procedures and treating other patients. Critical care was necessary to treat or prevent imminent or life-threatening deterioration. Critical care was time spent personally by me on the following activities: development of treatment plan with patient and/or surrogate as well as nursing, discussions with consultants, evaluation of patient's response to treatment, examination of patient, obtaining history from patient or surrogate, ordering and performing treatments and interventions, ordering and review of laboratory studies, ordering and review of radiographic studies, pulse oximetry and re-evaluation of patient's condition.  Final Clinical Impression(s) / ED Diagnoses Final diagnoses:  Angioedema of lips, initial encounter  Hypokalemia  Renal insufficiency    Rx / DC Orders ED Discharge Orders     None         Alissa April, MD 09/13/23 [phone]

## 2023-09-13 NOTE — ED Provider Notes (Signed)
  Physical Exam  BP (!) 144/84   Pulse 85   Temp 97.8 F (36.6 C) (Oral)   Resp 18   SpO2 94%   Physical Exam  Procedures  Procedures  ED Course / MDM    Medical Decision Making Amount and/or Complexity of Data Reviewed Labs: ordered.  Risk Prescription drug management.     Melody Spurling, assumed care for this patient.  In brief 58 year old male here today with angioedema believed to be caused by lisinopril.  On my assessment the patient, he believes that the swelling has improved a bit.  I monitored the patient for an additional 1-1/2 hours of my shift.  He says that his symptoms began at about 4 AM when he woke up.  With the symptoms improving, and not worsening over a 4-hour duration, unlikely to progress.  Return precautions discussed with patient, he reassured me he would return to the emergency room if he noticed any change or worsening in his symptoms.  Patient advised to stop taking lisinopril.  He is going to call his PCP this week for a follow-up appointment.     Afton Horse T, DO 09/13/23 [phone]

## 2023-09-13 NOTE — ED Triage Notes (Signed)
 Pt states he woke up with angioedema to both his lips and face, pt takes lisinopril, denies SOB at this time

## 2023-10-12 NOTE — Progress Notes (Signed)
 History of Present Illness This is a 58 y.o. male who returns for follow-up of type 2 diabetes.  Rip developed angioedema last month.  Lisinopril  was discontinued.  Otherwise he has been doing well with no other complaints.  He reports morning fasting bloods sugars are in the 100-130s.  Rare evening readings are reportedly 120-130s.  However, his Hgb A1C went up to 7.8% on 09/06/23 labs.  Urine ACR was up to 531.  Past Medical History:  Diagnosis Date  . Anxiety    started after my daughter passes away  . Diabetes mellitus (*)    FSBS RUNNING 115-130 PER PT. 06/02/16.  . Diabetes mellitus, type 2 (*)   . GAD (generalized anxiety disorder) 05/31/2012  . Hematuria, gross 05/10/2021  . Hyperlipidemia   . Hypertension    UNDER CONTROL PER PT. 06/02/16.  . Obesity, morbid (*)   . Other specified diabetes mellitus with diabetic peripheral angiopathy without gangrene (*)   . Prostate cancer (*) 07/29/2019  . Prostate mass 01/01/2020  . Sleep apnea   . Stroke (*) 01/19/2021   required hospitalization at [facility]   Past Surgical History:  Procedure Laterality Date  . Colonoscopy w/ polypectomy  06/13/2016   Procedure: COLONOSCOPY W/ POLYPECTOMY;  Surgeon: Elsie JAYSON Queen, MD;  Location: [facility] OR;  Service: Gastroenterology;;  . Shoulder surgery  2002   right  . Shoulder surgery     RIGHT 2002  . Wisdom tooth extraction  11/07/2016   Allergies  Allergen Reactions  . Doxycycline Rash  . Lisinopril  Swelling   Current Outpatient Medications on File Prior to Visit  Medication Sig Dispense Refill  . amLODIPine  besylate (NORVASC ) 5 mg tablet TAKE 1 TABLET(5 MG) BY MOUTH DAILY. PLEASE MAKE. FOLLOW UP APPOINTMENT FOR FUTURE REFILLS 60 tablet 1  . aspirin  (ECOTRIN LOW DOSE) EC tablet Take one tablet (81 mg dose) by mouth daily. 150 tablet 2  . Blood Glucose Monitoring Suppl (ONETOUCH VERIO REFLECT) w/Device KIT 1 each by Does not apply route 3 (three) times a day. 1 kit 0  . carvedilol   (COREG ) 12.5 mg tablet Take one tablet (12.5 mg dose) by mouth 2 (two) times daily. 60 tablet 5  . Insulin  Glargine (TOUJEO MAX SOLOSTAR) 300 UNIT/ML SOPN Inject sixty six Units into the skin daily. 24 mL 3  . Insulin  Pen Needle (BD PEN NEEDLE NANO 2ND GEN) 32G X 4 MM MISC USE AS DIRECTED EVERY DAY 100 each 3  . metFORMIN  ER (GLUCOPHAGE -XR) 500 mg 24 hr tablet Take two tablets (1,000 mg dose) by mouth 2 (two) times daily with meals. 360 tablet 3  . ondansetron  (ZOFRAN -ODT) 4 mg disintegrating tablet Take one tablet (4 mg dose) by mouth every 12 (twelve) hours as needed for Nausea.    SABRA JAN DELICA LANCETS 33G MISC one each by Other route 3 (three) times a day. 300 each 3  . ONETOUCH VERIO test strip TEST BLOOD SUGAR THREE TIMES DAILY AS DIRECTED 300 each 3  . rosuvastatin  calcium  (CRESTOR ) 20 mg tablet Take one tablet (20 mg dose) by mouth at bedtime.    . Semaglutide, 2 MG/DOSE, (OZEMPIC, 2 MG/DOSE,) 8 MG/3ML SOPN pen INJECT 2MG  ONCE A WEEK SUBQ 9 mL 3  . solifenacin succinate (VESICARE) 5 mg tablet Take one tablet (5 mg dose) by mouth daily.     No current facility-administered medications on file prior to visit.   Family History  Problem Relation Age of Onset  . Hypertension Mother   .  Cancer Father 37       prostate  . Diabetes Maternal Uncle   . Stroke Maternal Uncle   . No Known Problems Sister   . No Known Problems Brother   . Early death Daughter        just passed out  . No Known Problems Son   . No Known Problems Son   . Diabetes Maternal Uncle   . Stroke Maternal Uncle   . Diabetes Maternal Uncle    Social History   Socioeconomic History  . Marital status: Widowed    Spouse name: Murray  . Number of children: 3  . Years of education: 1  Occupational History  . Occupation: Agricultural consultant    Comment: [employer]  Tobacco Use  . Smoking status: Former    Current packs/day: 0.00    Average packs/day: 0.3 packs/day for 15.0 years (3.8 ttl pk-yrs)    Types:  Cigarettes    Start date: 05/12/1996    Quit date: 05/13/2011    Years since quitting: 12.4    Passive exposure: Past  . Smokeless tobacco: Never  Vaping Use  . Vaping status: Never Used  Substance and Sexual Activity  . Alcohol use: Yes    Comment: Occ  . Drug use: No  . Sexual activity: Yes    Partners: Female    Review of Systems Eight systems were reviewed; pertinent positives and negatives are as mentioned in the HPI.    Physical Examination Vitals:   10/12/23 0801  BP: 132/74  Weight: (!) 324 lb (147 kg)  Height: 5' 3 (1.6 m)   Body mass index is 57.39 kg/m. GENERAL:  Pleasant, well-appearing male in no distress.  Morbidly obese. HEENT: Pupils equal, round and reactive to light. Extraocular movements intact. Nondilated fundoscopic exam reveals no signs of retinopathy. Mucous membranes moist with no oropharyngeal lesions. NECK: Supple with no lymphadenopathy or thyromegaly. CARDIOVASCULAR: Normal rate and regular rhythm with no murmurs, rubs or gallops; 2+ distal pulses. EXTREMITIES: Warm and well-perfused.  No clubbing, cyanosis or edema. NEUROLOGIC: Alert and oriented x3. Gait and station normal. SKIN: Warm and dry, with no rashes. No acanthosis nigricans.  No results found for this or any previous visit (from the past week).  A/P:   1.  Type 2 Diabetes w/microalbuminuria:  [place] developed angioedema last month.  Lisinopril  was discontinued.  Otherwise he has been doing well with no other complaints.  He reports morning fasting bloods sugars are in the 100-130s.  Rare evening readings are reportedly 120-130s.  However, his Hgb A1C went up to 7.8% on 09/06/23 labs (was 7.4% in January).  Urine ACR was up to 531.  Daden is likely getting post-prandial hyperglycemia.  He needs to check blood sugars 2-3 times a day including 1-2 hours after meals.  He should cut back on foods/drinks that raise blood sugars above 200.  Toujeo Max Solostar 66 units daily, Ozempic 2mg  Qweek  and Metformin  ER 500mg  #2 tablets BID will be continued for now.  Ridge is high risk for worsening diabetic kidney disease.  ACE-I/ARB are now contraindicated with his recent angioedema.  He couldn't tolerate SGLT-2 inhibitors due to frequent UTIs.  I will try to get him on Kerendia 20mg  daily.  Labs will be checked in 4 weeks to monitor for hyperkalemia.  2.  Hyperlipidemia:  Total cholesterol 99 with LDL 35 on 09/06/23 labs.  Continue Rosuvastatin  20mg  QHS.  3.  Hypertension:  BP 132/74 today.  Follow-up in 4  months.     Donnice FORBES Lipps, MD 10/12/2023, 9:02 AM

## 2023-11-09 ENCOUNTER — Ambulatory Visit: Admitting: Podiatry

## 2023-11-23 ENCOUNTER — Ambulatory Visit (INDEPENDENT_AMBULATORY_CARE_PROVIDER_SITE_OTHER): Payer: Self-pay | Admitting: Podiatry

## 2023-11-23 DIAGNOSIS — Z91199 Patient's noncompliance with other medical treatment and regimen due to unspecified reason: Secondary | ICD-10-CM

## 2023-11-27 NOTE — Progress Notes (Signed)
 Patient was no-show for appointment today

## 2023-11-28 ENCOUNTER — Other Ambulatory Visit: Payer: Self-pay

## 2023-11-28 ENCOUNTER — Encounter (HOSPITAL_BASED_OUTPATIENT_CLINIC_OR_DEPARTMENT_OTHER): Payer: Self-pay

## 2023-11-28 ENCOUNTER — Emergency Department (HOSPITAL_BASED_OUTPATIENT_CLINIC_OR_DEPARTMENT_OTHER)

## 2023-11-28 ENCOUNTER — Observation Stay (HOSPITAL_BASED_OUTPATIENT_CLINIC_OR_DEPARTMENT_OTHER)
Admission: EM | Admit: 2023-11-28 | Discharge: 2023-11-30 | Disposition: A | Discharge status: Home / Self Care | Attending: Emergency Medicine | Admitting: Emergency Medicine

## 2023-11-28 DIAGNOSIS — G4733 Obstructive sleep apnea (adult) (pediatric): Secondary | ICD-10-CM | POA: Insufficient documentation

## 2023-11-28 DIAGNOSIS — E66813 Obesity, class 3: Secondary | ICD-10-CM | POA: Insufficient documentation

## 2023-11-28 DIAGNOSIS — Z6841 Body Mass Index (BMI) 40.0 and over, adult: Secondary | ICD-10-CM | POA: Insufficient documentation

## 2023-11-28 DIAGNOSIS — R112 Nausea with vomiting, unspecified: Secondary | ICD-10-CM | POA: Insufficient documentation

## 2023-11-28 DIAGNOSIS — E785 Hyperlipidemia, unspecified: Secondary | ICD-10-CM | POA: Insufficient documentation

## 2023-11-28 DIAGNOSIS — I10 Essential (primary) hypertension: Secondary | ICD-10-CM | POA: Diagnosis not present

## 2023-11-28 DIAGNOSIS — R0781 Pleurodynia: Secondary | ICD-10-CM | POA: Diagnosis not present

## 2023-11-28 DIAGNOSIS — R0602 Shortness of breath: Secondary | ICD-10-CM | POA: Diagnosis present

## 2023-11-28 DIAGNOSIS — E1110 Type 2 diabetes mellitus with ketoacidosis without coma: Secondary | ICD-10-CM | POA: Diagnosis not present

## 2023-11-28 HISTORY — DX: Sleep apnea, unspecified: G47.30

## 2023-11-28 LAB — CBG MONITORING, ED
Glucose-Capillary: 189 mg/dL — ABNORMAL HIGH (ref 70–99)
Glucose-Capillary: 142 mg/dL — ABNORMAL HIGH (ref 70–99)
Glucose-Capillary: 199 mg/dL — ABNORMAL HIGH (ref 70–99)
Glucose-Capillary: 213 mg/dL — ABNORMAL HIGH (ref 70–99)

## 2023-11-28 LAB — LACTIC ACID, PLASMA: Lactic Acid, Venous: 1.3 mmol/L (ref 0.5–1.9)

## 2023-11-28 LAB — CBC WITH DIFFERENTIAL/PLATELET
Abs Immature Granulocytes: 0.03 10*3/uL (ref 0.00–0.07)
Basophils Absolute: 0 10*3/uL (ref 0.0–0.1)
Basophils Relative: 0 %
Eosinophils Absolute: 0 10*3/uL (ref 0.0–0.5)
Eosinophils Relative: 0 %
HCT: 40.4 % (ref 39.0–52.0)
Hemoglobin: 13.8 g/dL (ref 13.0–17.0)
Immature Granulocytes: 0 %
Lymphocytes Relative: 11 %
Lymphs Abs: 0.9 10*3/uL (ref 0.7–4.0)
MCH: 30.6 pg (ref 26.0–34.0)
MCHC: 34.2 g/dL (ref 30.0–36.0)
MCV: 89.6 fL (ref 80.0–100.0)
Monocytes Absolute: 0.4 10*3/uL (ref 0.1–1.0)
Monocytes Relative: 5 %
Neutro Abs: 6.6 10*3/uL (ref 1.7–7.7)
Neutrophils Relative %: 84 %
Platelets: 179 10*3/uL (ref 150–400)
RBC: 4.51 MIL/uL (ref 4.22–5.81)
RDW: 15 % (ref 11.5–15.5)
WBC: 7.9 10*3/uL (ref 4.0–10.5)
nRBC: 0 % (ref 0.0–0.2)

## 2023-11-28 LAB — COMPREHENSIVE METABOLIC PANEL WITH GFR
ALT: 48 U/L — ABNORMAL HIGH (ref 0–44)
ALT: 43 U/L (ref 0–44)
AST: 106 U/L — ABNORMAL HIGH (ref 15–41)
AST: 83 U/L — ABNORMAL HIGH (ref 15–41)
Albumin: 4.2 g/dL (ref 3.5–5.0)
Albumin: 3.8 g/dL (ref 3.5–5.0)
Alkaline Phosphatase: 106 U/L (ref 38–126)
Alkaline Phosphatase: 87 U/L (ref 38–126)
Anion gap: 26 — ABNORMAL HIGH (ref 5–15)
Anion gap: 22 — ABNORMAL HIGH (ref 5–15)
BUN: 8 mg/dL (ref 6–20)
BUN: 7 mg/dL (ref 6–20)
CO2: 10 mmol/L — ABNORMAL LOW (ref 22–32)
CO2: 21 mmol/L — ABNORMAL LOW (ref 22–32)
Calcium: 9.9 mg/dL (ref 8.9–10.3)
Calcium: 7.7 mg/dL — ABNORMAL LOW (ref 8.9–10.3)
Chloride: 100 mmol/L (ref 98–111)
Chloride: 92 mmol/L — ABNORMAL LOW (ref 98–111)
Creatinine, Ser: 0.99 mg/dL (ref 0.61–1.24)
Creatinine, Ser: 0.84 mg/dL (ref 0.61–1.24)
GFR, Estimated: >60 mL/min (ref >60)
GFR, Estimated: >60 mL/min (ref >60)
Glucose, Bld: 160 mg/dL — ABNORMAL HIGH (ref 70–99)
Glucose, Bld: 512 mg/dL (ref 70–99)
Potassium: 4.5 mmol/L (ref 3.5–5.1)
Potassium: 3.6 mmol/L (ref 3.5–5.1)
Sodium: 136 mmol/L (ref 135–145)
Sodium: 135 mmol/L (ref 135–145)
Total Bilirubin: 0.9 mg/dL (ref 0.0–1.2)
Total Bilirubin: 1 mg/dL (ref 0.0–1.2)
Total Protein: 8.9 g/dL — ABNORMAL HIGH (ref 6.5–8.1)
Total Protein: 7 g/dL (ref 6.5–8.1)

## 2023-11-28 LAB — URINALYSIS, MICROSCOPIC (REFLEX)

## 2023-11-28 LAB — I-STAT VENOUS BLOOD GAS, ED
Acid-base deficit: 11 mmol/L — ABNORMAL HIGH (ref 0.0–2.0)
Bicarbonate: 12.6 mmol/L — ABNORMAL LOW (ref 20.0–28.0)
Calcium, Ion: 1.1 mmol/L — ABNORMAL LOW (ref 1.15–1.40)
HCT: 41 % (ref 39.0–52.0)
Hemoglobin: 13.9 g/dL (ref 13.0–17.0)
O2 Saturation: 98 %
Patient temperature: 97.9
Potassium: 4.1 mmol/L (ref 3.5–5.1)
Sodium: 136 mmol/L (ref 135–145)
TCO2: 13 mmol/L — ABNORMAL LOW (ref 22–32)
pCO2, Ven: 21.9 mmHg — ABNORMAL LOW (ref 44–60)
pH, Ven: 7.366 (ref 7.25–7.43)
pO2, Ven: 112 mmHg — ABNORMAL HIGH (ref 32–45)

## 2023-11-28 LAB — URINALYSIS, ROUTINE W REFLEX MICROSCOPIC
Glucose, UA: NEGATIVE mg/dL
Ketones, ur: >=80 mg/dL — AB
Leukocytes,Ua: NEGATIVE
Nitrite: NEGATIVE
Protein, ur: >=300 mg/dL — AB
Specific Gravity, Urine: >=1.03 (ref 1.005–1.030)
pH: 6 (ref 5.0–8.0)

## 2023-11-28 LAB — I-STAT CHEM 8, ED
BUN: 7 mg/dL (ref 6–20)
Calcium, Ion: 1.08 mmol/L — ABNORMAL LOW (ref 1.15–1.40)
Chloride: 106 mmol/L (ref 98–111)
Creatinine, Ser: 0.8 mg/dL (ref 0.61–1.24)
Glucose, Bld: 164 mg/dL — ABNORMAL HIGH (ref 70–99)
HCT: 43 % (ref 39.0–52.0)
Hemoglobin: 14.6 g/dL (ref 13.0–17.0)
Potassium: 4.1 mmol/L (ref 3.5–5.1)
Sodium: 136 mmol/L (ref 135–145)
TCO2: 15 mmol/L — ABNORMAL LOW (ref 22–32)

## 2023-11-28 LAB — BETA-HYDROXYBUTYRIC ACID: Beta-Hydroxybutyric Acid: 6.4 mmol/L — ABNORMAL HIGH (ref 0.05–0.27)

## 2023-11-28 LAB — PRO BRAIN NATRIURETIC PEPTIDE: Pro Brain Natriuretic Peptide: <36 pg/mL (ref <300.0)

## 2023-11-28 LAB — LIPASE, BLOOD: Lipase: 202 U/L — ABNORMAL HIGH (ref 11–51)

## 2023-11-28 MED ORDER — DEXTROSE 10 % IV BOLUS
250.0000 mL | Freq: Once | INTRAVENOUS | Status: AC
  Administered 2023-11-28: 250 mL via INTRAVENOUS
  Filled 2023-11-28: qty 500

## 2023-11-28 MED ORDER — DEXTROSE IN LACTATED RINGERS 5 % IV SOLN: INTRAVENOUS | Status: DC

## 2023-11-28 MED ORDER — ONDANSETRON HCL 4 MG/2ML IJ SOLN
4.0000 mg | Freq: Once | INTRAMUSCULAR | Status: DC
  Filled 2023-11-28: qty 2

## 2023-11-28 MED ORDER — SODIUM CHLORIDE 0.9 % IV BOLUS
1000.0000 mL | Freq: Once | INTRAVENOUS | Status: AC
  Administered 2023-11-28: 1000 mL via INTRAVENOUS

## 2023-11-28 MED ORDER — IOHEXOL 350 MG/ML SOLN
125.0000 mL | Freq: Once | INTRAVENOUS | Status: AC | PRN
  Administered 2023-11-28: 125 mL via INTRAVENOUS

## 2023-11-28 MED ORDER — LACTATED RINGERS IV SOLN: INTRAVENOUS | Status: DC

## 2023-11-28 MED ORDER — SODIUM BICARBONATE 8.4 % IV SOLN
INTRAVENOUS | Status: AC
  Filled 2023-11-28: qty 150

## 2023-11-28 MED ORDER — POTASSIUM CHLORIDE 10 MEQ/100ML IV SOLN
10.0000 meq | INTRAVENOUS | Status: AC
  Administered 2023-11-28 (×2): 10 meq via INTRAVENOUS
  Filled 2023-11-28 (×2): qty 100

## 2023-11-28 MED ORDER — DEXTROSE 50 % IV SOLN: 0.0000 mL | INTRAVENOUS | Status: DC | PRN

## 2023-11-28 MED ORDER — INSULIN REGULAR(HUMAN) IN NACL 100-0.9 UT/100ML-% IV SOLN
INTRAVENOUS | Status: DC
  Administered 2023-11-28: 8.5 [IU]/h via INTRAVENOUS
  Filled 2023-11-28: qty 100

## 2023-11-28 MED ORDER — LACTATED RINGERS IV BOLUS: 20.0000 mL/kg | Freq: Once | INTRAVENOUS | Status: DC

## 2023-11-28 MED ORDER — SODIUM BICARBONATE 8.4 % IV SOLN
INTRAVENOUS | Status: DC
  Filled 2023-11-28: qty 1000

## 2023-11-28 NOTE — ED Provider Notes (Signed)
 [facility] Provider Note   CSN: [id number] Arrival date & time: 11/28/23  1445     Patient presents with: Shortness of Breath   Jeremy Clark is a 58 y.o. male past medical history of again for diabetes, hypertension, anxiety, CVA, and anemia presents today for 2 hours of shortness of breath.  Patient states he was laying down when it started.  Patient reports heart racing, nausea, and decreased appetite.  Patient reports that his shortness of breath worsens with activity and laying flat.    Shortness of Breath Associated symptoms: vomiting        Prior to Admission medications   Medication Sig Start Date End Date Taking? Authorizing Provider  alfuzosin  (UROXATRAL ) 10 MG 24 hr tablet Take 10 mg by mouth daily. 04/12/21   [provider]  amLODipine  (NORVASC ) 5 MG tablet Take 5 mg by mouth 2 (two) times daily. 07/31/20   [provider]  carvedilol  (COREG ) 12.5 MG tablet Take 12.5 mg by mouth 2 (two) times daily. 10/28/20   [provider]  cetirizine (ZYRTEC) 10 MG tablet Take 10 mg by mouth daily. 04/12/21   [provider]  ferrous sulfate  325 (65 FE) MG tablet Take 1 tablet (325 mg total) by mouth 3 (three) times daily with meals. 05/17/21   Pokhrel, Vernal, MD  lisinopril  (PRINIVIL ,ZESTRIL ) 30 MG tablet Take 30 mg by mouth daily.    [provider]  OZEMPIC, 2 MG/DOSE, 8 MG/3ML SOPN Inject 2 mg into the skin every Monday. 12/08/20   [provider]  rosuvastatin  (CRESTOR ) 20 MG tablet Take 1 tablet (20 mg total) by mouth daily. 08/12/21   Whitfield Raisin, NP  XIGDUO  XR 09-998 MG TB24 Take 1 tablet by mouth 2 (two) times daily. 06/16/20   [provider]    Allergies: Doxycycline and Lisinopril     Review of Systems  Respiratory:  Positive for shortness of breath.   Gastrointestinal:  Positive for nausea and vomiting.  Endocrine: Positive for polydipsia.    Updated Vital  Signs BP (!) 165/118   Pulse (!) 117   Temp 97.9 F (36.6 C) (Oral)   Resp 10   Ht 5' 3 (1.6 m)   Wt (!) 139.7 kg   SpO2 100%   BMI 54.56 kg/m   Physical Exam Vitals and nursing note reviewed.  Constitutional:      General: He is not in acute distress.    Appearance: He is well-developed. He is obese. He is not ill-appearing or toxic-appearing.  HENT:     Head: Normocephalic and atraumatic.     Mouth/Throat:     Mouth: Mucous membranes are dry.  Eyes:     Extraocular Movements: Extraocular movements intact.     Conjunctiva/sclera: Conjunctivae normal.  Cardiovascular:     Rate and Rhythm: Regular rhythm. Tachycardia present.     Heart sounds: No murmur heard. Pulmonary:     Effort: Pulmonary effort is normal. Tachypnea present. No respiratory distress.     Breath sounds: Normal breath sounds.     Comments: Auscultation limited due to body habitus Abdominal:     General: There is no distension.     Palpations: Abdomen is soft.     Tenderness: There is no abdominal tenderness. There is no guarding.     Hernia: A hernia is present. Hernia is present in the ventral area.     Comments: Hernia soft and compressible  Musculoskeletal:  General: No swelling.     Cervical back: Neck supple.     Right lower leg: No edema.     Left lower leg: No edema.  Skin:    General: Skin is warm and dry.     Capillary Refill: Capillary refill takes less than 2 seconds.  Neurological:     General: No focal deficit present.     Mental Status: He is alert and oriented to person, place, and time.  Psychiatric:        Mood and Affect: Mood normal.     (all labs ordered are listed, but only abnormal results are displayed) Labs Reviewed  URINALYSIS, ROUTINE W REFLEX MICROSCOPIC - Abnormal; Notable for the following components:      Result Value   APPearance HAZY (*)    Hgb urine dipstick LARGE (*)    Bilirubin Urine MODERATE (*)    Ketones, ur >=80 (*)    Protein, ur >=300 (*)     All other components within normal limits  COMPREHENSIVE METABOLIC PANEL WITH GFR - Abnormal; Notable for the following components:   CO2 10 (*)    Glucose, Bld 160 (*)    Total Protein 8.9 (*)    AST 106 (*)    ALT 48 (*)    Anion gap 26 (*)    All other components within normal limits  LIPASE, BLOOD - Abnormal; Notable for the following components:   Lipase 202 (*)    All other components within normal limits  BETA-HYDROXYBUTYRIC ACID - Abnormal; Notable for the following components:   Beta-Hydroxybutyric Acid 6.40 (*)    All other components within normal limits  URINALYSIS, MICROSCOPIC (REFLEX) - Abnormal; Notable for the following components:   Bacteria, UA FEW (*)    All other components within normal limits  COMPREHENSIVE METABOLIC PANEL WITH GFR - Abnormal; Notable for the following components:   Chloride 92 (*)    CO2 21 (*)    Glucose, Bld 512 (*)    Calcium  7.7 (*)    AST 83 (*)    Anion gap 22 (*)    All other components within normal limits  CBG MONITORING, ED - Abnormal; Notable for the following components:   Glucose-Capillary 189 (*)    All other components within normal limits  I-STAT VENOUS BLOOD GAS, ED - Abnormal; Notable for the following components:   pCO2, Ven 21.9 (*)    pO2, Ven 112 (*)    Bicarbonate 12.6 (*)    TCO2 13 (*)    Acid-base deficit 11.0 (*)    Calcium , Ion 1.10 (*)    All other components within normal limits  I-STAT CHEM 8, ED - Abnormal; Notable for the following components:   Glucose, Bld 164 (*)    Calcium , Ion 1.08 (*)    TCO2 15 (*)    All other components within normal limits  CBG MONITORING, ED - Abnormal; Notable for the following components:   Glucose-Capillary 142 (*)    All other components within normal limits  CBG MONITORING, ED - Abnormal; Notable for the following components:   Glucose-Capillary 199 (*)    All other components within normal limits  CBG MONITORING, ED - Abnormal; Notable for the following  components:   Glucose-Capillary 213 (*)    All other components within normal limits  CBC WITH DIFFERENTIAL/PLATELET  PRO BRAIN NATRIURETIC PEPTIDE  LACTIC ACID, PLASMA  LACTIC ACID, PLASMA    EKG: EKG Interpretation Date/Time:  Tuesday November 28 2023  14:59:40 EDT Ventricular Rate:  113 PR Interval:  138 QRS Duration:  88 QT Interval:  384 QTC Calculation: 527 R Axis:   45  Text Interpretation: Sinus tachycardia Prolonged QT interval Confirmed by Yolande Charleston [phone]) on 11/28/2023 3:46:46 PM  Radiology: CT ABDOMEN PELVIS W CONTRAST Result Date: 11/28/2023 CLINICAL DATA:  Acute nonlocalized abdominal pain. Shortness of breath. Tachycardia. Nausea. Decreased appetite EXAM: CT ANGIOGRAPHY CHEST CT ABDOMEN AND PELVIS WITH CONTRAST TECHNIQUE: Multidetector CT imaging of the chest was performed using the standard protocol during bolus administration of intravenous contrast. Multiplanar CT image reconstructions and MIPs were obtained to evaluate the vascular anatomy. Multidetector CT imaging of the abdomen and pelvis was performed using the standard protocol during bolus administration of intravenous contrast. RADIATION DOSE REDUCTION: This exam was performed according to the departmental dose-optimization program which includes automated exposure control, adjustment of the mA and/or kV according to patient size and/or use of iterative reconstruction technique. CONTRAST:  OMNIPAQUE  IOHEXOL  350 MG/ML SOLN COMPARISON:  CT abdomen pelvis 05/09/2021; same day chest radiograph; CTA chest 08/06/2020 FINDINGS: CTA CHEST FINDINGS Cardiovascular: Suboptimal opacification of the pulmonary arteries. No central or lobar pulmonary embolism. The segmental and subsegmental branches are suboptimally evaluated due to contrast timing. Normal heart size. No pericardial effusion. Normal caliber thoracic aorta. Mediastinum/Nodes: Trachea and esophagus are unremarkable. No thoracic adenopathy. Lungs/Pleura: Scattered  centrilobular micro nodules are likely due to small airway infection/inflammation. For example in the medial left lower lobe circa series 303/image 88. No pleural effusion or pneumothorax. Musculoskeletal: No acute fracture. Review of the MIP images confirms the above findings. CT ABDOMEN and PELVIS FINDINGS Hepatobiliary: Marked hepatic steatosis. Unremarkable gallbladder and biliary tree. Pancreas: Unremarkable. Spleen: Unremarkable. Adrenals/Urinary Tract: Stable adrenal glands. No obstructing ureteral calculi or hydronephrosis. Unremarkable bladder. Stomach/Bowel: Normal caliber large and small bowel. Nonobstructed transverse colon herniates into a large fat containing supraumbilical hernia. Stomach and appendix are within normal limits. Vascular/Lymphatic: No significant vascular findings are present. No enlarged abdominal or pelvic lymph nodes. Reproductive: Fiducial markers in the prostate. Other: No free intraperitoneal fluid or air. Musculoskeletal: No acute fracture. 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 Review of the MIP images confirms the above findings. IMPRESSION: 1. No central or lobar pulmonary embolism. The segmental and subsegmental branches are suboptimally evaluated due to contrast timing. 2. Scattered centrilobular micro nodules are likely due to small airway infection/inflammation. 3. Marked hepatic steatosis. 4. Nonobstructed transverse colon herniates into a large fat containing supraumbilical hernia. Electronically Signed   By: Norman Gatlin M.D.   On: 11/28/2023 21:18   CT Angio Chest PE W and/or Wo Contrast Result Date: 11/28/2023 CLINICAL DATA:  Acute nonlocalized abdominal pain. Shortness of breath. Tachycardia. Nausea. Decreased appetite EXAM: CT ANGIOGRAPHY CHEST CT ABDOMEN AND PELVIS WITH CONTRAST TECHNIQUE: Multidetector CT imaging of the chest was performed using the standard protocol during bolus administration of intravenous contrast. Multiplanar CT image  reconstructions and MIPs were obtained to evaluate the vascular anatomy. Multidetector CT imaging of the abdomen and pelvis was performed using the standard protocol during bolus administration of intravenous contrast. RADIATION DOSE REDUCTION: This exam was performed according to the departmental dose-optimization program which includes automated exposure control, adjustment of the mA and/or kV according to patient size and/or use of iterative reconstruction technique. CONTRAST:  OMNIPAQUE  IOHEXOL  350 MG/ML SOLN COMPARISON:  CT abdomen pelvis 05/09/2021; same day chest radiograph; CTA chest 08/06/2020 FINDINGS: CTA CHEST FINDINGS Cardiovascular: Suboptimal opacification of the pulmonary arteries. No central or lobar pulmonary  embolism. The segmental and subsegmental branches are suboptimally evaluated due to contrast timing. Normal heart size. No pericardial effusion. Normal caliber thoracic aorta. Mediastinum/Nodes: Trachea and esophagus are unremarkable. No thoracic adenopathy. Lungs/Pleura: Scattered centrilobular micro nodules are likely due to small airway infection/inflammation. For example in the medial left lower lobe circa series 303/image 88. No pleural effusion or pneumothorax. Musculoskeletal: No acute fracture. Review of the MIP images confirms the above findings. CT ABDOMEN and PELVIS FINDINGS Hepatobiliary: Marked hepatic steatosis. Unremarkable gallbladder and biliary tree. Pancreas: Unremarkable. Spleen: Unremarkable. Adrenals/Urinary Tract: Stable adrenal glands. No obstructing ureteral calculi or hydronephrosis. Unremarkable bladder. Stomach/Bowel: Normal caliber large and small bowel. Nonobstructed transverse colon herniates into a large fat containing supraumbilical hernia. Stomach and appendix are within normal limits. Vascular/Lymphatic: No significant vascular findings are present. No enlarged abdominal or pelvic lymph nodes. Reproductive: Fiducial markers in the prostate. Other: No  free intraperitoneal fluid or air. Musculoskeletal: No acute fracture. 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 Review of the MIP images confirms the above findings. IMPRESSION: 1. No central or lobar pulmonary embolism. The segmental and subsegmental branches are suboptimally evaluated due to contrast timing. 2. Scattered centrilobular micro nodules are likely due to small airway infection/inflammation. 3. Marked hepatic steatosis. 4. Nonobstructed transverse colon herniates into a large fat containing supraumbilical hernia. Electronically Signed   By: Norman Gatlin M.D.   On: 11/28/2023 21:18   DG Chest 2 View Result Date: 11/28/2023 CLINICAL DATA:  Shortness of breath EXAM: CHEST - 2 VIEW COMPARISON:  05/09/2021 FINDINGS: The heart size and mediastinal contours are within normal limits. Both lungs are clear. The visualized skeletal structures are unremarkable. IMPRESSION: No active cardiopulmonary disease. Electronically Signed   By: Luke Bun M.D.   On: 11/28/2023 16:01     .Critical Care  Performed by: Francis Ileana SAILOR, PA-C Authorized by: Francis Ileana SAILOR, PA-C   Critical care provider statement:    Critical care time (minutes):  60   Critical care time was exclusive of:  Separately billable procedures and treating other patients   Critical care was necessary to treat or prevent imminent or life-threatening deterioration of the following conditions:  Endocrine crisis   Critical care was time spent personally by me on the following activities:  Ordering and performing treatments and interventions, blood draw for specimens, development of treatment plan with patient or surrogate, evaluation of patient's response to treatment, examination of patient, obtaining history from patient or surrogate, review of old charts, re-evaluation of patient's condition, pulse oximetry, ordering and review of radiographic studies and ordering and review of laboratory studies   I assumed direction of  critical care for this patient from another provider in my specialty: no     Care discussed with: admitting provider      Medications Ordered in the ED  ondansetron  (ZOFRAN ) injection 4 mg (4 mg Intravenous Patient Refused/Not Given 11/28/23 1637)  insulin  regular, human (MYXREDLIN ) 100 units/ 100 mL infusion (9.5 Units/hr Intravenous Rate/Dose Change 11/28/23 2324)  lactated ringers  infusion (0 mLs Intravenous Hold 11/28/23 2209)  dextrose  5 % in lactated ringers  infusion ( Intravenous New Bag/Given 11/28/23 2331)  dextrose  50 % solution 0-50 mL (has no administration in time range)  potassium chloride  10 mEq in 100 mL IVPB (10 mEq Intravenous New Bag/Given 11/28/23 2304)  sodium bicarbonate  150 mEq in dextrose  5 % 1,150 mL infusion ( Intravenous New Bag/Given 11/28/23 2148)  sodium bicarbonate  1 mEq/mL injection (  Canceled Entry 11/28/23 2209)  sodium chloride  0.9 %  bolus 1,000 mL ( Intravenous Stopped 11/28/23 1801)  sodium chloride  0.9 % bolus 1,000 mL (0 mLs Intravenous Stopped 11/28/23 2228)  dextrose  (D10W) 10% bolus 250 mL ( Intravenous Stopped 11/28/23 2245)  iohexol  (OMNIPAQUE ) 350 MG/ML injection 125 mL (125 mLs Intravenous Contrast Given 11/28/23 2027)                                    Medical Decision Making Amount and/or Complexity of Data Reviewed Labs: ordered. Radiology: ordered.  Risk Prescription drug management. Decision regarding hospitalization.   This patient presents to the ED for concern of shortness of breath, nausea and vomiting, this involves an extensive number of treatment options, and is a complaint that carries with it a high risk of complications and morbidity.  The differential diagnosis includes CHF exacerbation, fluid overload, viral GI illness, COPD, pneumonia, DKA, HHS, hyperglycemia, appendicitis, choledocholithiasis, pancreatitis, acute cholecystitis, diverticulitis, ulcerative colitis   Co morbidities / Chronic conditions that complicate the patient  evaluation  Diabetes, hypertension, anxiety   Additional history obtained:  Additional history obtained from EMR External records from outside source obtained and reviewed including endocrinology progress notes   Lab Tests:  I Ordered, and personally interpreted labs.  The pertinent results include: CBC WNL, glucose 164, i-STAT Chem-8 with decreased calcium  ionized, VBG with decreased pCO2, increased PO2, decreased T CO2, acid-base deficit 11, and decreased bicarb, urine with moderate bilirubin, large hemoglobin, greater than 80 ketones, greater than 300 protein, elevated specific gravity, few bacteria, BNP <36.0, decreased CO2 at 10, elevated anion gap at 26, elevated lipase at 202, elevated AST at 106, elevated ALT at 48, beta hydroxy 6.4, lactic 1.3   Imaging Studies ordered:  I ordered imaging studies including chest x-ray I independently visualized and interpreted imaging which showed no active cardiopulmonary disease I agree with the radiologist interpretation CTA chest: Scattered centrilobular micronodules likely due to small airway infection/inflammation.  No central or lobar PEs.  Segmental and subsegmental branches are suboptimally evaluated due to contrast timing. CT abdomen pelvis with contrast: Marked hepatic steatosis.  Nonobstructed transverse colon herniates into the large fat-containing supraumbilical hernia   Cardiac Monitoring: / EKG:  The patient was maintained on a cardiac monitor.  I personally viewed and interpreted the cardiac monitored which showed an underlying rhythm of: Sinus tach, prolonged QTc interval   Problem List / ED Course / Critical interventions / Medication management  I ordered medication including IVF, DKA order set Reevaluation of the patient after these medicines showed that the patient stayed the same I have reviewed the patients home medicines and have made adjustments as needed   Consultations Obtained:  I requested consultation  with the Hospitalist, Dr. ,  and discussed lab and imaging findings as well as pertinent plan - they recommend: Admission   Test / Admission - Considered:  Admit for DKA  Patient signed out to Dr. April Palumbo pending admission to Hospitalist for DKA.     Final diagnoses:  Diabetic ketoacidosis without coma associated with type 2 diabetes mellitus [facility])    ED Discharge Orders     None          Francis Ileana LOISE DEVONNA 11/29/23 1504    Yolande Lamar BROCKS, MD 12/05/23 1009

## 2023-11-28 NOTE — ED Triage Notes (Addendum)
 C/o shob x2 hours. Was laying down when it started. Endorses racing heart and nausea with decreased appetite. Shob worsens with activity and lying flat. Denies sick contacts and chest pain.

## 2023-11-28 NOTE — ED Notes (Signed)
 Pt. Is sitting in chair with no complaints and has call bell at [facility] side.

## 2023-11-29 DIAGNOSIS — E1110 Type 2 diabetes mellitus with ketoacidosis without coma: Secondary | ICD-10-CM | POA: Diagnosis present

## 2023-11-29 LAB — CBC WITH DIFFERENTIAL/PLATELET
Abs Immature Granulocytes: 0.04 10*3/uL (ref 0.00–0.07)
Basophils Absolute: 0 10*3/uL (ref 0.0–0.1)
Basophils Relative: 0 %
Eosinophils Absolute: 0.1 10*3/uL (ref 0.0–0.5)
Eosinophils Relative: 1 %
HCT: 37.9 % — ABNORMAL LOW (ref 39.0–52.0)
Hemoglobin: 12.8 g/dL — ABNORMAL LOW (ref 13.0–17.0)
Immature Granulocytes: 0 %
Lymphocytes Relative: 10 %
Lymphs Abs: 0.9 10*3/uL (ref 0.7–4.0)
MCH: 31.1 pg (ref 26.0–34.0)
MCHC: 33.8 g/dL (ref 30.0–36.0)
MCV: 92 fL (ref 80.0–100.0)
Monocytes Absolute: 0.6 10*3/uL (ref 0.1–1.0)
Monocytes Relative: 6 %
Neutro Abs: 7.5 10*3/uL (ref 1.7–7.7)
Neutrophils Relative %: 83 %
Platelets: 137 10*3/uL — ABNORMAL LOW (ref 150–400)
RBC: 4.12 MIL/uL — ABNORMAL LOW (ref 4.22–5.81)
RDW: 15.2 % (ref 11.5–15.5)
WBC: 9.1 10*3/uL (ref 4.0–10.5)
nRBC: 0 % (ref 0.0–0.2)

## 2023-11-29 LAB — COMPREHENSIVE METABOLIC PANEL WITH GFR
ALT: 42 U/L (ref 0–44)
AST: 76 U/L — ABNORMAL HIGH (ref 15–41)
Albumin: 3.5 g/dL (ref 3.5–5.0)
Alkaline Phosphatase: 81 U/L (ref 38–126)
Anion gap: 13 (ref 5–15)
BUN: 7 mg/dL (ref 6–20)
CO2: 18 mmol/L — ABNORMAL LOW (ref 22–32)
Calcium: 9.2 mg/dL (ref 8.9–10.3)
Chloride: 104 mmol/L (ref 98–111)
Creatinine, Ser: 0.74 mg/dL (ref 0.61–1.24)
GFR, Estimated: >60 mL/min (ref >60)
Glucose, Bld: 146 mg/dL — ABNORMAL HIGH (ref 70–99)
Potassium: 3.6 mmol/L (ref 3.5–5.1)
Sodium: 135 mmol/L (ref 135–145)
Total Bilirubin: 1.9 mg/dL — ABNORMAL HIGH (ref 0.0–1.2)
Total Protein: 8.1 g/dL (ref 6.5–8.1)

## 2023-11-29 LAB — GLUCOSE, CAPILLARY
Glucose-Capillary: 142 mg/dL — ABNORMAL HIGH (ref 70–99)
Glucose-Capillary: 153 mg/dL — ABNORMAL HIGH (ref 70–99)
Glucose-Capillary: 189 mg/dL — ABNORMAL HIGH (ref 70–99)
Glucose-Capillary: 146 mg/dL — ABNORMAL HIGH (ref 70–99)
Glucose-Capillary: 167 mg/dL — ABNORMAL HIGH (ref 70–99)
Glucose-Capillary: 140 mg/dL — ABNORMAL HIGH (ref 70–99)
Glucose-Capillary: 168 mg/dL — ABNORMAL HIGH (ref 70–99)
Glucose-Capillary: 186 mg/dL — ABNORMAL HIGH (ref 70–99)
Glucose-Capillary: 168 mg/dL — ABNORMAL HIGH (ref 70–99)
Glucose-Capillary: 200 mg/dL — ABNORMAL HIGH (ref 70–99)
Glucose-Capillary: 168 mg/dL — ABNORMAL HIGH (ref 70–99)
Glucose-Capillary: 209 mg/dL — ABNORMAL HIGH (ref 70–99)

## 2023-11-29 LAB — BETA-HYDROXYBUTYRIC ACID
Beta-Hydroxybutyric Acid: 1.38 mmol/L — ABNORMAL HIGH (ref 0.05–0.27)
Beta-Hydroxybutyric Acid: 0.98 mmol/L — ABNORMAL HIGH (ref 0.05–0.27)
Beta-Hydroxybutyric Acid: 0.38 mmol/L — ABNORMAL HIGH (ref 0.05–0.27)

## 2023-11-29 LAB — BASIC METABOLIC PANEL WITH GFR
Anion gap: 15 (ref 5–15)
Anion gap: 12 (ref 5–15)
BUN: 6 mg/dL (ref 6–20)
BUN: <5 mg/dL — ABNORMAL LOW (ref 6–20)
CO2: 16 mmol/L — ABNORMAL LOW (ref 22–32)
CO2: 20 mmol/L — ABNORMAL LOW (ref 22–32)
Calcium: 9 mg/dL (ref 8.9–10.3)
Calcium: 9.1 mg/dL (ref 8.9–10.3)
Chloride: 101 mmol/L (ref 98–111)
Chloride: 103 mmol/L (ref 98–111)
Creatinine, Ser: 0.87 mg/dL (ref 0.61–1.24)
Creatinine, Ser: 0.75 mg/dL (ref 0.61–1.24)
GFR, Estimated: >60 mL/min (ref >60)
GFR, Estimated: >60 mL/min (ref >60)
Glucose, Bld: 170 mg/dL — ABNORMAL HIGH (ref 70–99)
Glucose, Bld: 218 mg/dL — ABNORMAL HIGH (ref 70–99)
Potassium: 3.7 mmol/L (ref 3.5–5.1)
Potassium: 3.5 mmol/L (ref 3.5–5.1)
Sodium: 132 mmol/L — ABNORMAL LOW (ref 135–145)
Sodium: 135 mmol/L (ref 135–145)

## 2023-11-29 LAB — CBG MONITORING, ED
Glucose-Capillary: 124 mg/dL — ABNORMAL HIGH (ref 70–99)
Glucose-Capillary: 144 mg/dL — ABNORMAL HIGH (ref 70–99)

## 2023-11-29 LAB — HEMOGLOBIN A1C
Hgb A1c MFr Bld: 6.8 % — ABNORMAL HIGH (ref 4.8–5.6)
Mean Plasma Glucose: 148.46 mg/dL

## 2023-11-29 LAB — PHOSPHORUS: Phosphorus: <1 mg/dL — CL (ref 2.5–4.6)

## 2023-11-29 LAB — MAGNESIUM: Magnesium: 1.7 mg/dL (ref 1.7–2.4)

## 2023-11-29 LAB — MRSA NEXT GEN BY PCR, NASAL: MRSA by PCR Next Gen: NOT DETECTED

## 2023-11-29 MED ORDER — CARVEDILOL 12.5 MG PO TABS
12.5000 mg | ORAL_TABLET | Freq: Two times a day (BID) | ORAL | Status: DC
  Administered 2023-11-29 – 2023-11-30 (×2): 12.5 mg via ORAL
  Filled 2023-11-29 (×2): qty 1

## 2023-11-29 MED ORDER — ACETAMINOPHEN 650 MG RE SUPP: 650.0000 mg | Freq: Four times a day (QID) | RECTAL | Status: DC | PRN

## 2023-11-29 MED ORDER — ENOXAPARIN SODIUM 150 MG/ML IJ SOSY
140.0000 mg | PREFILLED_SYRINGE | Freq: Two times a day (BID) | INTRAMUSCULAR | Status: DC
  Administered 2023-11-29 – 2023-11-30 (×3): 140 mg via SUBCUTANEOUS
  Filled 2023-11-29 (×4): qty 0.94

## 2023-11-29 MED ORDER — CHLORHEXIDINE GLUCONATE CLOTH 2 % EX PADS
6.0000 | MEDICATED_PAD | Freq: Every day | CUTANEOUS | Status: DC
  Filled 2023-11-29: qty 6

## 2023-11-29 MED ORDER — MELATONIN 3 MG PO TABS
3.0000 mg | ORAL_TABLET | Freq: Every evening | ORAL | Status: DC | PRN
  Administered 2023-11-29: 3 mg via ORAL
  Filled 2023-11-29: qty 1

## 2023-11-29 MED ORDER — POTASSIUM CHLORIDE 10 MEQ/100ML IV SOLN
10.0000 meq | INTRAVENOUS | Status: AC
  Administered 2023-11-29 (×4): 10 meq via INTRAVENOUS
  Filled 2023-11-29 (×4): qty 100

## 2023-11-29 MED ORDER — ONDANSETRON HCL 4 MG/2ML IJ SOLN: 4.0000 mg | Freq: Four times a day (QID) | INTRAMUSCULAR | Status: DC | PRN

## 2023-11-29 MED ORDER — AMLODIPINE BESYLATE 10 MG PO TABS
5.0000 mg | ORAL_TABLET | Freq: Two times a day (BID) | ORAL | Status: DC
  Administered 2023-11-29 – 2023-11-30 (×3): 5 mg via ORAL
  Filled 2023-11-29 (×3): qty 1

## 2023-11-29 MED ORDER — ACETAMINOPHEN 325 MG PO TABS
650.0000 mg | ORAL_TABLET | Freq: Four times a day (QID) | ORAL | Status: DC | PRN
  Administered 2023-11-29: 650 mg via ORAL
  Filled 2023-11-29: qty 2

## 2023-11-29 MED ORDER — INSULIN ASPART 100 UNIT/ML IJ SOLN
0.0000 [IU] | Freq: Three times a day (TID) | INTRAMUSCULAR | Status: DC
  Administered 2023-11-29 – 2023-11-30 (×3): 3 [IU] via SUBCUTANEOUS

## 2023-11-29 MED ORDER — INSULIN GLARGINE-YFGN 100 UNIT/ML ~~LOC~~ SOLN
10.0000 [IU] | Freq: Every day | SUBCUTANEOUS | Status: DC
  Administered 2023-11-29: 10 [IU] via SUBCUTANEOUS
  Filled 2023-11-29: qty 0.1

## 2023-11-29 MED ORDER — INSULIN GLARGINE-YFGN 100 UNIT/ML ~~LOC~~ SOLN
14.0000 [IU] | Freq: Once | SUBCUTANEOUS | Status: AC
  Administered 2023-11-29: 14 [IU] via SUBCUTANEOUS
  Filled 2023-11-29: qty 0.14

## 2023-11-29 MED ORDER — AMLODIPINE BESYLATE 5 MG PO TABS
5.0000 mg | ORAL_TABLET | Freq: Once | ORAL | Status: AC
  Administered 2023-11-29: 5 mg via ORAL
  Filled 2023-11-29: qty 1

## 2023-11-29 MED ORDER — HYDRALAZINE HCL 25 MG PO TABS
25.0000 mg | ORAL_TABLET | Freq: Three times a day (TID) | ORAL | Status: DC
  Administered 2023-11-29 – 2023-11-30 (×3): 25 mg via ORAL
  Filled 2023-11-29 (×3): qty 1

## 2023-11-29 MED ORDER — INSULIN ASPART 100 UNIT/ML IJ SOLN
0.0000 [IU] | Freq: Every day | INTRAMUSCULAR | Status: DC
  Administered 2023-11-29: 2 [IU] via SUBCUTANEOUS

## 2023-11-29 MED ORDER — ENOXAPARIN SODIUM 150 MG/ML IJ SOSY
140.0000 mg | PREFILLED_SYRINGE | Freq: Once | INTRAMUSCULAR | Status: AC
  Administered 2023-11-29: 140 mg via SUBCUTANEOUS
  Filled 2023-11-29: qty 1

## 2023-11-29 MED ORDER — HYDRALAZINE HCL 20 MG/ML IJ SOLN
5.0000 mg | Freq: Four times a day (QID) | INTRAMUSCULAR | Status: DC | PRN
  Administered 2023-11-29: 5 mg via INTRAVENOUS
  Filled 2023-11-29: qty 1

## 2023-11-29 MED ORDER — INSULIN GLARGINE-YFGN 100 UNIT/ML ~~LOC~~ SOLN
24.0000 [IU] | Freq: Every day | SUBCUTANEOUS | Status: DC
  Administered 2023-11-30: 24 [IU] via SUBCUTANEOUS
  Filled 2023-11-29: qty 0.24

## 2023-11-29 MED ORDER — POTASSIUM PHOSPHATES 15 MMOLE/5ML IV SOLN
30.0000 mmol | Freq: Once | INTRAVENOUS | Status: AC
  Administered 2023-11-29: 30 mmol via INTRAVENOUS
  Filled 2023-11-29: qty 10

## 2023-11-29 NOTE — Inpatient Diabetes Management (Signed)
 Inpatient Diabetes Program Recommendations  AACE/ADA: New Consensus Statement on Inpatient Glycemic Control (2015)  Target Ranges:  Prepandial:   less than 140 mg/dL      Peak postprandial:   less than 180 mg/dL (1-2 hours)      Critically ill patients:  140 - 180 mg/dL   Lab Results  Component Value Date   GLUCAP 168 (H) 11/29/2023   HGBA1C 4.8 05/10/2021    Review of Glycemic Control  Diabetes history: DM2 Outpatient Diabetes medications: Toujeo 48 at bedtime, metformin  500 BID, Ozempic 2 mg weekly Current orders for Inpatient glycemic control: Transitioned off drip to Semglee 10 daily, Novolog  0-15 TID with meals and 0-5 HS  HgbA1C pending Last one 7.4% at Endo [facility] Endo- Donnice Lipps, MD Next appt - 02/14/2024  Inpatient Diabetes Program Recommendations:    Increase Semglee to 24 units daily (may want to give extra 14 units now)  Add Novolog  4 units TID with meals if eating > 50%  Spoke with pt at bedside and confirmed home meds. Followed by endocrinologist. Pt states he monitors his blood sugars, although he's been eating too much lately. Knows what to do, just needs to do it. Answered all questions. Will discuss CGM tomorrow.   Continue to follow.  Thank you. Shona Brandy, RD, LDN, CDCES Inpatient Diabetes Coordinator [phone]

## 2023-11-29 NOTE — Progress Notes (Signed)
(  Carryover admission to the Day Admitter; accepted by Dr.  Shona as transfer from  [facility]  to a  SDU bed at  [facility]  for DKA in this type II diabetic. Please see Dr.  Milford transfer documentation in [facility] Communication for additional details).  Per brief review, most recent CBG was 150.  Most recent potassium level was 3.6 when checked around 2130 on 11/28/2023.  I have placed some additional preliminary admit orders via the adult multi-morbid admission order set. I have continued existing orders for insulin  drip via Endo tool, consisting IV fluids, including current D5 LR running at 125 cc/h.  Upon arrival at [facility], I have ordered CMP, CBC, magnesium  level, phosphorus level, and beta hydroxybutyrate acid, all to be checked now.  In light of most recent potassium level 3.6 when checked around 2130 last evening, I anticipate next potassium level to be lower given interval insulin  drip.  Consequently, if ordered potassium chloride  40 mill equivalents IV over 4 hours, and added every 4 hours BMP checks as well as every 4 hour beta-hydroxybutyrate acid checks through 1300 today.  Also added every hour CBG monitoring, carb modified diet, prn IV Zofran .   Eva Pore, DO Hospitalist

## 2023-11-29 NOTE — Progress Notes (Signed)
 PHARMACY - ANTICOAGULATION CONSULT NOTE  Pharmacy Consult for enoxaarin Indication: possible PE   Allergies  Allergen Reactions   Doxycycline Rash   Lisinopril  Swelling    Patient Measurements: Height: 5' 3 (160 cm) Weight: (!) 140.5 kg (309 lb 11.9 oz) IBW/kg (Calculated) : 56.9 HEPARIN DW (KG): 91.9  Vital Signs: Temp: 98.2 F (36.8 C) (07/02 0800) Temp Source: Oral (07/02 0800) BP: 201/99 (07/02 0600) Pulse Rate: 100 (07/02 0900)  Labs: Recent Labs    11/28/23 1527 11/28/23 1537 11/28/23 2132 11/29/23 0714  HGB 13.8 14.6  13.9  --  12.8*  HCT 40.4 43.0  41.0  --  37.9*  PLT 179  --   --  137*  CREATININE 0.99 0.80 0.84 0.74    Estimated Creatinine Clearance: 130.1 mL/min (by C-G formula based on SCr of 0.74 mg/dL).   Medical History: Past Medical History:  Diagnosis Date   Abdominal hernia    Diabetes mellitus    High cholesterol    Hypertension    Prostate cancer (HCC)    Sleep apnea     Medications:  Medications Prior to Admission  Medication Sig Dispense Refill Last Dose/Taking   carvedilol  (COREG ) 12.5 MG tablet Take 12.5 mg by mouth 2 (two) times daily.   11/27/2023   insulin  glargine, 2 Unit Dial, (TOUJEO MAX SOLOSTAR) 300 UNIT/ML Solostar Pen Inject 60 Units into the skin daily.   11/27/2023   KERENDIA 20 MG TABS Take 20 mg by mouth daily.   11/27/2023   metFORMIN  (GLUCOPHAGE -XR) 500 MG 24 hr tablet Take 500 mg by mouth in the morning and at bedtime.   11/27/2023   OZEMPIC, 2 MG/DOSE, 8 MG/3ML SOPN Inject 2 mg into the skin every Monday.   11/27/2023   rosuvastatin  (CRESTOR ) 20 MG tablet Take 1 tablet (20 mg total) by mouth daily. 30 tablet 5 11/27/2023   lisinopril  (PRINIVIL ,ZESTRIL ) 30 MG tablet Take 30 mg by mouth daily. (Patient not taking: Reported on 11/29/2023)   Not Taking    Assessment: Pharmacy is consulted to dose enoxaparin in 58 yo male with a possible PE. Pt is being empirically currently.  PE has not been ruled out. Pt complained of  dyspnea on admission. CT of chest showed No central or lobar pulmonary embolism. The segmental and subsegmental branches are suboptimally evaluated due to contrast timing. Pt received an empiric dose of enoxaparin 140 mg in ED.   Today, 11/29/23 Hgb 12.8, plt 137  Scr 0.74 mg/dl, CrCl > 899 ml/min  Listed weight is 140.5 kg    Goal of Therapy:  Monitor platelets by anticoagulation protocol: Yes   Plan:  Enoxaparin 140 mg SQ q12h  Monitor SCr, CBC  Monitor follow-up scans to determine if pt has active clot  Monitor for signs and symptoms of bleeding  Dolphus Roller, PharmD, BCPS 11/29/2023 10:42 AM

## 2023-11-29 NOTE — H&P (Signed)
 History and Physical  Jeremy Clark FMW:[id number] DOB: 01/24/1966 DOA: 11/28/2023  PCP: Gladystine Erminio LITTIE, MD   Chief Complaint: Feeling lethargic  HPI: Jeremy Clark is a 58 y.o. male with medical history significant for type 2 diabetes, hypertension, hyperlipidemia, obesity being admitted to the hospital with diabetic ketoacidosis.  Patient states that he is compliant with diabetic diet and medications, and that his blood sugar normally runs 150-200.  States that for the last 2 or 3 days, he was not feeling very great, but yesterday he had sudden onset of severe lethargy, denies any chest pain, fevers, cough, sick contacts, abdominal pain, diarrhea or other concerns.  He was feeling extremely lethargic, and also somewhat short of breath.  Denies any pleuritic chest pain.  He presented to the ER for evaluation and was found to have evidence of hyperglycemia and diabetic ketoacidosis.  He was started on IV fluids, IV insulin  drip and states that he is feeling significantly better this morning.  Review of Systems: Please see HPI for pertinent positives and negatives. A complete 10 system review of systems are otherwise negative.  Past Medical History:  Diagnosis Date   Abdominal hernia    Diabetes mellitus    High cholesterol    Hypertension    Prostate cancer (HCC)    Sleep apnea    Past Surgical History:  Procedure Laterality Date   CYSTOSCOPY WITH FULGERATION N/A 05/11/2021   Procedure: CYSTOSCOPY WITH CLOT EVACUATION;  Surgeon: Elisabeth Valli BIRCH, MD;  Location: [facility];  Service: Urology;  Laterality: N/A;   PROSTATE BIOPSY     SHOULDER SURGERY     right   Social History:  reports that he quit smoking about 20 years ago. His smoking use included cigarettes. He started smoking about 40 years ago. He has a 10 pack-year smoking history. He has never used smokeless tobacco. He reports that he does not currently use alcohol. He reports that he does not use drugs.  Allergies  Allergen  Reactions   Doxycycline Rash   Lisinopril  Swelling    Family History  Problem Relation Age of Onset   Hypertension Mother    Prostate cancer Father    Aneurysm Daughter    Colon cancer Neg Hx    Pancreatic cancer Neg Hx    Breast cancer Neg Hx      Prior to Admission medications   Medication Sig Start Date End Date Taking? Authorizing Provider  metFORMIN  (GLUCOPHAGE -XR) 500 MG 24 hr tablet Take 1,000 mg by mouth. 01/18/23  Yes [provider]  alfuzosin  (UROXATRAL ) 10 MG 24 hr tablet Take 10 mg by mouth daily. 04/12/21   [provider]  amLODipine  (NORVASC ) 5 MG tablet Take 5 mg by mouth 2 (two) times daily. 07/31/20   [provider]  carvedilol  (COREG ) 12.5 MG tablet Take 12.5 mg by mouth 2 (two) times daily. 10/28/20   [provider]  cetirizine (ZYRTEC) 10 MG tablet Take 10 mg by mouth daily. 04/12/21   [provider]  ferrous sulfate  325 (65 FE) MG tablet Take 1 tablet (325 mg total) by mouth 3 (three) times daily with meals. 05/17/21   Pokhrel, Laxman, MD  KERENDIA 20 MG TABS Take by mouth.    [provider]  lisinopril  (PRINIVIL ,ZESTRIL ) 30 MG tablet Take 30 mg by mouth daily.    [provider]  OZEMPIC, 2 MG/DOSE, 8 MG/3ML SOPN Inject 2 mg into the skin every Monday. 12/08/20   [provider]  rosuvastatin  (CRESTOR ) 20 MG tablet Take 1 tablet (20 mg total) by mouth daily. 08/12/21   Whitfield Raisin, NP  XIGDUO  XR 09-998 MG TB24 Take 1 tablet by mouth 2 (two) times daily. 06/16/20   [provider]    Physical Exam: BP (!) 201/99   Pulse (!) 110   Temp 98.2 F (36.8 C) (Oral)   Resp (!) 28   Ht 5' 3 (1.6 m)   Wt (!) 140.5 kg   SpO2 99%   BMI 54.87 kg/m  General:  Alert, oriented, calm, in no acute distress  Eyes: EOMI, clear conjuctivae, white sclerea Neck: supple, no masses, trachea mildline  Cardiovascular: RRR, no murmurs or rubs, no peripheral edema  Respiratory: clear to  auscultation bilaterally, no wheezes, no crackles  Abdomen: soft, nontender, nondistended, normal bowel tones heard, uncomplicated abdominal hernia Skin: dry, no rashes  Musculoskeletal: no joint effusions, normal range of motion  Psychiatric: appropriate affect, normal speech  Neurologic: extraocular muscles intact, clear speech, moving all extremities with intact sensorium         Labs on Admission:  Basic Metabolic Panel: Recent Labs  Lab 11/28/23 1527 11/28/23 1537 11/28/23 2132 11/29/23 0714  NA 136 136  136 135 135  K 4.5 4.1  4.1 3.6 3.6  CL 100 106 92* 104  CO2 10*  --  21* 18*  GLUCOSE 160* 164* 512* 146*  BUN 8 7 7 7   CREATININE 0.99 0.80 0.84 0.74  CALCIUM  9.9  --  7.7* 9.2  MG  --   --   --  1.7  PHOS  --   --   --  <1.0*   Liver Function Tests: Recent Labs  Lab 11/28/23 1527 11/28/23 2132 11/29/23 0714  AST 106* 83* 76*  ALT 48* 43 42  ALKPHOS 106 87 81  BILITOT 0.9 1.0 1.9*  PROT 8.9* 7.0 8.1  ALBUMIN 4.2 3.8 3.5   Recent Labs  Lab 11/28/23 1527  LIPASE 202*   No results for input(s): AMMONIA in the last 168 hours. CBC: Recent Labs  Lab 11/28/23 1527 11/28/23 1537 11/29/23 0714  WBC 7.9  --  9.1  NEUTROABS 6.6  --  7.5  HGB 13.8 14.6  13.9 12.8*  HCT 40.4 43.0  41.0 37.9*  MCV 89.6  --  92.0  PLT 179  --  137*   Cardiac Enzymes: No results for input(s): CKTOTAL, CKMB, CKMBINDEX, TROPONINI in the last 168 hours. BNP (last 3 results) No results for input(s): BNP in the last 8760 hours.  ProBNP (last 3 results) Recent Labs    11/28/23 1515  PROBNP <36.0    CBG: Recent Labs  Lab 11/29/23 0356 11/29/23 0459 11/29/23 0606 11/29/23 0725 11/29/23 0831  GLUCAP 153* 189* 146* 167* 140*    Radiological Exams on Admission: CT ABDOMEN PELVIS W CONTRAST Result Date: 11/28/2023 CLINICAL DATA:  Acute nonlocalized abdominal pain. Shortness of breath. Tachycardia. Nausea. Decreased appetite EXAM: CT ANGIOGRAPHY CHEST CT  ABDOMEN AND PELVIS WITH CONTRAST TECHNIQUE: Multidetector CT imaging of the chest was performed using the standard protocol during bolus administration of intravenous contrast. Multiplanar CT image reconstructions and MIPs were obtained to evaluate the vascular anatomy. Multidetector CT imaging of the abdomen and pelvis was performed using the standard protocol during bolus administration of intravenous contrast. RADIATION DOSE REDUCTION: This exam was performed according to the departmental dose-optimization program which includes automated exposure control, adjustment of the mA and/or kV according to patient size and/or use of iterative  reconstruction technique. CONTRAST:  OMNIPAQUE  IOHEXOL  350 MG/ML SOLN COMPARISON:  CT abdomen pelvis 05/09/2021; same day chest radiograph; CTA chest 08/06/2020 FINDINGS: CTA CHEST FINDINGS Cardiovascular: Suboptimal opacification of the pulmonary arteries. No central or lobar pulmonary embolism. The segmental and subsegmental branches are suboptimally evaluated due to contrast timing. Normal heart size. No pericardial effusion. Normal caliber thoracic aorta. Mediastinum/Nodes: Trachea and esophagus are unremarkable. No thoracic adenopathy. Lungs/Pleura: Scattered centrilobular micro nodules are likely due to small airway infection/inflammation. For example in the medial left lower lobe circa series 303/image 88. No pleural effusion or pneumothorax. Musculoskeletal: No acute fracture. Review of the MIP images confirms the above findings. CT ABDOMEN and PELVIS FINDINGS Hepatobiliary: Marked hepatic steatosis. Unremarkable gallbladder and biliary tree. Pancreas: Unremarkable. Spleen: Unremarkable. Adrenals/Urinary Tract: Stable adrenal glands. No obstructing ureteral calculi or hydronephrosis. Unremarkable bladder. Stomach/Bowel: Normal caliber large and small bowel. Nonobstructed transverse colon herniates into a large fat containing supraumbilical hernia. Stomach and appendix  are within normal limits. Vascular/Lymphatic: No significant vascular findings are present. No enlarged abdominal or pelvic lymph nodes. Reproductive: Fiducial markers in the prostate. Other: No free intraperitoneal fluid or air. Musculoskeletal: No acute fracture. 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 Review of the MIP images confirms the above findings. IMPRESSION: 1. No central or lobar pulmonary embolism. The segmental and subsegmental branches are suboptimally evaluated due to contrast timing. 2. Scattered centrilobular micro nodules are likely due to small airway infection/inflammation. 3. Marked hepatic steatosis. 4. Nonobstructed transverse colon herniates into a large fat containing supraumbilical hernia. Electronically Signed   By: Norman Gatlin M.D.   On: 11/28/2023 21:18   CT Angio Chest PE W and/or Wo Contrast Result Date: 11/28/2023 CLINICAL DATA:  Acute nonlocalized abdominal pain. Shortness of breath. Tachycardia. Nausea. Decreased appetite EXAM: CT ANGIOGRAPHY CHEST CT ABDOMEN AND PELVIS WITH CONTRAST TECHNIQUE: Multidetector CT imaging of the chest was performed using the standard protocol during bolus administration of intravenous contrast. Multiplanar CT image reconstructions and MIPs were obtained to evaluate the vascular anatomy. Multidetector CT imaging of the abdomen and pelvis was performed using the standard protocol during bolus administration of intravenous contrast. RADIATION DOSE REDUCTION: This exam was performed according to the departmental dose-optimization program which includes automated exposure control, adjustment of the mA and/or kV according to patient size and/or use of iterative reconstruction technique. CONTRAST:  OMNIPAQUE  IOHEXOL  350 MG/ML SOLN COMPARISON:  CT abdomen pelvis 05/09/2021; same day chest radiograph; CTA chest 08/06/2020 FINDINGS: CTA CHEST FINDINGS Cardiovascular: Suboptimal opacification of the pulmonary arteries. No central or lobar  pulmonary embolism. The segmental and subsegmental branches are suboptimally evaluated due to contrast timing. Normal heart size. No pericardial effusion. Normal caliber thoracic aorta. Mediastinum/Nodes: Trachea and esophagus are unremarkable. No thoracic adenopathy. Lungs/Pleura: Scattered centrilobular micro nodules are likely due to small airway infection/inflammation. For example in the medial left lower lobe circa series 303/image 88. No pleural effusion or pneumothorax. Musculoskeletal: No acute fracture. Review of the MIP images confirms the above findings. CT ABDOMEN and PELVIS FINDINGS Hepatobiliary: Marked hepatic steatosis. Unremarkable gallbladder and biliary tree. Pancreas: Unremarkable. Spleen: Unremarkable. Adrenals/Urinary Tract: Stable adrenal glands. No obstructing ureteral calculi or hydronephrosis. Unremarkable bladder. Stomach/Bowel: Normal caliber large and small bowel. Nonobstructed transverse colon herniates into a large fat containing supraumbilical hernia. Stomach and appendix are within normal limits. Vascular/Lymphatic: No significant vascular findings are present. No enlarged abdominal or pelvic lymph nodes. Reproductive: Fiducial markers in the prostate. Other: No free intraperitoneal fluid or air. Musculoskeletal: No acute fracture.  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 Review of the MIP images confirms the above findings. IMPRESSION: 1. No central or lobar pulmonary embolism. The segmental and subsegmental branches are suboptimally evaluated due to contrast timing. 2. Scattered centrilobular micro nodules are likely due to small airway infection/inflammation. 3. Marked hepatic steatosis. 4. Nonobstructed transverse colon herniates into a large fat containing supraumbilical hernia. Electronically Signed   By: Norman Gatlin M.D.   On: 11/28/2023 21:18   DG Chest 2 View Result Date: 11/28/2023 CLINICAL DATA:  Shortness of breath EXAM: CHEST - 2 VIEW COMPARISON:   05/09/2021 FINDINGS: The heart size and mediastinal contours are within normal limits. Both lungs are clear. The visualized skeletal structures are unremarkable. IMPRESSION: No active cardiopulmonary disease. Electronically Signed   By: Luke Bun M.D.   On: 11/28/2023 16:01   Assessment/Plan Jeremy Clark is a 58 y.o. male with medical history significant for type 2 diabetes, hypertension, hyperlipidemia, obesity being admitted to the hospital with diabetic ketoacidosis.  Diabetic ketoacidosis-patient with hyperglycemia, anion gap metabolic acidosis.  No recent illness though he was feeling unwell for the last 72 hours.  Patient states he has been compliant with his medications.  He was treated with IV fluids, IV insulin  drip, this morning anion gap is closed and blood sugar is now in the normal range. -Inpatient admission -Continue monitoring in stepdown -Discontinue IV insulin  drip and transition to basal bolus insulin  dosing -Carb modified diet -Consult to diabetes coordinator -Check hemoglobin A1c, was well-controlled in 2022  Hypertension-appears to be on lisinopril  and amlodipine  at home, will resume these medications once reconciled by pharmacy staff.  IV hydralazine as needed in the meantime.  OSA-CPAP nightly  PE not ruled out-in the setting of complaints of dyspnea, patient had not optimal CT angiogram which ruled out central and lobar PE.  Received a dose of therapeutic Lovenox in the ER, currently without pleurisy, shortness of breath, or hypoxia on room air.  Morbid obesity-BMI 54.9, complicating all aspects of care  DVT prophylaxis: Lovenox as above    Code Status: Full Code  Consults called: None  Admission status: The appropriate patient status for this patient is INPATIENT. Inpatient status is judged to be reasonable and necessary in order to provide the required intensity of service to ensure the patient's safety. The patient's presenting symptoms, physical exam  findings, and initial radiographic and laboratory data in the context of their chronic comorbidities is felt to place them at high risk for further clinical deterioration. Furthermore, it is not anticipated that the patient will be medically stable for discharge from the hospital within 2 midnights of admission.    I certify that at the point of admission it is my clinical judgment that the patient will require inpatient hospital care spanning beyond 2 midnights from the point of admission due to high intensity of service, high risk for further deterioration and high frequency of surveillance required  Time spent: 56 minutes  Jaystin Mcgarvey CHRISTELLA Gail MD [facility] Pager [phone]  If 7PM-7AM, please contact night-coverage [email or web address] Password [email or web address]  11/29/2023, 9:13 AM

## 2023-11-30 DIAGNOSIS — E1110 Type 2 diabetes mellitus with ketoacidosis without coma: Secondary | ICD-10-CM | POA: Diagnosis not present

## 2023-11-30 DIAGNOSIS — G4733 Obstructive sleep apnea (adult) (pediatric): Secondary | ICD-10-CM

## 2023-11-30 DIAGNOSIS — R0781 Pleurodynia: Secondary | ICD-10-CM | POA: Diagnosis present

## 2023-11-30 LAB — GLUCOSE, CAPILLARY: Glucose-Capillary: 195 mg/dL — ABNORMAL HIGH (ref 70–99)

## 2023-11-30 MED ORDER — ENSURE PLUS HIGH PROTEIN PO LIQD: 237.0000 mL | Freq: Two times a day (BID) | ORAL | Status: DC

## 2023-11-30 NOTE — Plan of Care (Signed)

## 2023-11-30 NOTE — Discharge Summary (Signed)
 Physician Discharge Summary   Patient: Jeremy Clark MRN: [id number] DOB: July 31, 1965  Admit date:     11/28/2023  Discharge date: 11/30/23  Discharge Physician: Delon Herald   PCP: Gladystine Erminio LITTIE, MD   Recommendations at discharge:   Resume home medications Follow up with Dr. Gladystine in 1-2 weeks  Discharge Diagnoses: Principal Problem:   DKA, type 2 (HCC) Active Problems:   Essential hypertension   Morbid obesity with BMI of 50.0-59.9, adult (HCC)   OSA on CPAP   Pleuritic chest pain    Hospital Course: 58yo with h/o DM, HTN, HLD, and super morbid obesity (class 3) who presented on 7/3 with lethargy.  He was treated with IVF and insulin  with improvement.  Assessment and Plan:  DKA Patient presenting with hyperglycemia, high anion gap metabolic acidosis No recent illness though he was feeling unwell for the last 72 hours Patient states he has been compliant with his medications He reports that he was having anorexia and so was excessively drinking sugary fluids He was treated with IV fluids, IV insulin  drip Anion gap is closed and acidosis has resolved Transitioned to basal bolus insulin  dosing Carb modified diet Consulted diabetes coordinator A1c 6.8   Hypertension Continue carvedilol  Both lisinopril  and amlodipine  were discontinued by provider per report  HLD Continue rosuvastatin    OSA Continue CPAP nightly   Pleuritic CP Mild dyspnea and CP on presentation, now resolved and on RA CTA was suboptimal but ruled out central and lobar PE Received a dose of therapeutic Lovenox in the ER Will not continue to treat based on resolution of symptoms and low suspicion for PE   Morbid/Class 3 obesity Body mass index is 55.81 kg/m.SABRA  Weight loss should be encouraged on an ongoing basis Continue Ozempic Outpatient PCP/bariatric medicine f/u encouraged Significantly low or high BMI is associated with higher medical risk including morbidity and mortality       Pain control - [place]  Controlled Substance Reporting System database was reviewed. and patient was instructed, not to drive, operate heavy machinery, perform activities at heights, swimming or participation in water activities or provide baby-sitting services while on Pain, Sleep and Anxiety Medications; until their outpatient Physician has advised to do so again. Also recommended to not to take more than prescribed Pain, Sleep and Anxiety Medications.   Disposition: Home Diet recommendation:  Carb modified diet DISCHARGE MEDICATION: Allergies as of 11/30/2023       Reactions   Doxycycline Rash   Lisinopril  Swelling        Medication List     STOP taking these medications    Kerendia 20 MG Tabs Generic drug: Finerenone   lisinopril  30 MG tablet Commonly known as: ZESTRIL        TAKE these medications    carvedilol  12.5 MG tablet Commonly known as: COREG  Take 12.5 mg by mouth 2 (two) times daily.   metFORMIN  500 MG 24 hr tablet Commonly known as: GLUCOPHAGE -XR Take 500 mg by mouth in the morning and at bedtime.   Ozempic (2 MG/DOSE) 8 MG/3ML Sopn Generic drug: Semaglutide (2 MG/DOSE) Inject 2 mg into the skin every Monday.   rosuvastatin  20 MG tablet Commonly known as: Crestor  Take 1 tablet (20 mg total) by mouth daily.   Toujeo Max SoloStar 300 UNIT/ML Solostar Pen Generic drug: insulin  glargine (2 Unit Dial) Inject 60 Units into the skin daily.        Discharge Exam:    Subjective: Feeling well, eager to go home.  Objective: Vitals:   11/30/23 0203 11/30/23 0630  BP: (!) 150/75 (!) 142/85  Pulse: 97 95  Resp: 20   Temp: 98.1 F (36.7 C) 98.3 F (36.8 C)  SpO2: 100% 100%    Intake/Output Summary (Last 24 hours) at 11/30/2023 1034 Last data filed at 11/30/2023 1014 Gross per 24 hour  Intake 759.16 ml  Output 1700 ml  Net -940.84 ml   Filed Weights   11/28/23 1455 11/29/23 0300 11/30/23 0500  Weight: (!) 139.7 kg (!) 140.5  kg (!) 142.9 kg    Exam:  General:  Appears calm and comfortable and is in NAD Eyes:   normal lids, iris ENT:  grossly normal hearing, lips & tongue, mmm Cardiovascular:  RRR, no m/r/g. No LE edema.  Respiratory:   CTA bilaterally with no wheezes/rales/rhonchi.  Normal respiratory effort. Abdomen:  soft, NT, ND; large ventral hernia Skin:  no rash or induration seen on limited exam Musculoskeletal:  grossly normal tone BUE/BLE, good ROM, no bony abnormality Psychiatric:  grossly normal mood and affect, speech fluent and appropriate, AOx3 Neurologic:  CN 2-12 grossly intact, moves all extremities in coordinated fashion  Data Reviewed: I have reviewed the patient's lab results since admission.  Pertinent labs for today include:   Stable yesterday    Condition at discharge: improving  The results of significant diagnostics from this hospitalization (including imaging, microbiology, ancillary and laboratory) are listed below for reference.   Imaging Studies: CT ABDOMEN PELVIS W CONTRAST Result Date: 11/28/2023 CLINICAL DATA:  Acute nonlocalized abdominal pain. Shortness of breath. Tachycardia. Nausea. Decreased appetite EXAM: CT ANGIOGRAPHY CHEST CT ABDOMEN AND PELVIS WITH CONTRAST TECHNIQUE: Multidetector CT imaging of the chest was performed using the standard protocol during bolus administration of intravenous contrast. Multiplanar CT image reconstructions and MIPs were obtained to evaluate the vascular anatomy. Multidetector CT imaging of the abdomen and pelvis was performed using the standard protocol during bolus administration of intravenous contrast. RADIATION DOSE REDUCTION: This exam was performed according to the departmental dose-optimization program which includes automated exposure control, adjustment of the mA and/or kV according to patient size and/or use of iterative reconstruction technique. CONTRAST:  OMNIPAQUE  IOHEXOL  350 MG/ML SOLN COMPARISON:  CT abdomen pelvis  05/09/2021; same day chest radiograph; CTA chest 08/06/2020 FINDINGS: CTA CHEST FINDINGS Cardiovascular: Suboptimal opacification of the pulmonary arteries. No central or lobar pulmonary embolism. The segmental and subsegmental branches are suboptimally evaluated due to contrast timing. Normal heart size. No pericardial effusion. Normal caliber thoracic aorta. Mediastinum/Nodes: Trachea and esophagus are unremarkable. No thoracic adenopathy. Lungs/Pleura: Scattered centrilobular micro nodules are likely due to small airway infection/inflammation. For example in the medial left lower lobe circa series 303/image 88. No pleural effusion or pneumothorax. Musculoskeletal: No acute fracture. Review of the MIP images confirms the above findings. CT ABDOMEN and PELVIS FINDINGS Hepatobiliary: Marked hepatic steatosis. Unremarkable gallbladder and biliary tree. Pancreas: Unremarkable. Spleen: Unremarkable. Adrenals/Urinary Tract: Stable adrenal glands. No obstructing ureteral calculi or hydronephrosis. Unremarkable bladder. Stomach/Bowel: Normal caliber large and small bowel. Nonobstructed transverse colon herniates into a large fat containing supraumbilical hernia. Stomach and appendix are within normal limits. Vascular/Lymphatic: No significant vascular findings are present. No enlarged abdominal or pelvic lymph nodes. Reproductive: Fiducial markers in the prostate. Other: No free intraperitoneal fluid or air. Musculoskeletal: No acute fracture. 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 Review of the MIP images confirms the above findings. IMPRESSION: 1. No central or lobar pulmonary embolism. The segmental and subsegmental branches are suboptimally evaluated due to  contrast timing. 2. Scattered centrilobular micro nodules are likely due to small airway infection/inflammation. 3. Marked hepatic steatosis. 4. Nonobstructed transverse colon herniates into a large fat containing supraumbilical hernia. Electronically  Signed   By: Norman Gatlin M.D.   On: 11/28/2023 21:18   CT Angio Chest PE W and/or Wo Contrast Result Date: 11/28/2023 CLINICAL DATA:  Acute nonlocalized abdominal pain. Shortness of breath. Tachycardia. Nausea. Decreased appetite EXAM: CT ANGIOGRAPHY CHEST CT ABDOMEN AND PELVIS WITH CONTRAST TECHNIQUE: Multidetector CT imaging of the chest was performed using the standard protocol during bolus administration of intravenous contrast. Multiplanar CT image reconstructions and MIPs were obtained to evaluate the vascular anatomy. Multidetector CT imaging of the abdomen and pelvis was performed using the standard protocol during bolus administration of intravenous contrast. RADIATION DOSE REDUCTION: This exam was performed according to the departmental dose-optimization program which includes automated exposure control, adjustment of the mA and/or kV according to patient size and/or use of iterative reconstruction technique. CONTRAST:  OMNIPAQUE  IOHEXOL  350 MG/ML SOLN COMPARISON:  CT abdomen pelvis 05/09/2021; same day chest radiograph; CTA chest 08/06/2020 FINDINGS: CTA CHEST FINDINGS Cardiovascular: Suboptimal opacification of the pulmonary arteries. No central or lobar pulmonary embolism. The segmental and subsegmental branches are suboptimally evaluated due to contrast timing. Normal heart size. No pericardial effusion. Normal caliber thoracic aorta. Mediastinum/Nodes: Trachea and esophagus are unremarkable. No thoracic adenopathy. Lungs/Pleura: Scattered centrilobular micro nodules are likely due to small airway infection/inflammation. For example in the medial left lower lobe circa series 303/image 88. No pleural effusion or pneumothorax. Musculoskeletal: No acute fracture. Review of the MIP images confirms the above findings. CT ABDOMEN and PELVIS FINDINGS Hepatobiliary: Marked hepatic steatosis. Unremarkable gallbladder and biliary tree. Pancreas: Unremarkable. Spleen: Unremarkable. Adrenals/Urinary  Tract: Stable adrenal glands. No obstructing ureteral calculi or hydronephrosis. Unremarkable bladder. Stomach/Bowel: Normal caliber large and small bowel. Nonobstructed transverse colon herniates into a large fat containing supraumbilical hernia. Stomach and appendix are within normal limits. Vascular/Lymphatic: No significant vascular findings are present. No enlarged abdominal or pelvic lymph nodes. Reproductive: Fiducial markers in the prostate. Other: No free intraperitoneal fluid or air. Musculoskeletal: No acute fracture. 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 Review of the MIP images confirms the above findings. IMPRESSION: 1. No central or lobar pulmonary embolism. The segmental and subsegmental branches are suboptimally evaluated due to contrast timing. 2. Scattered centrilobular micro nodules are likely due to small airway infection/inflammation. 3. Marked hepatic steatosis. 4. Nonobstructed transverse colon herniates into a large fat containing supraumbilical hernia. Electronically Signed   By: Norman Gatlin M.D.   On: 11/28/2023 21:18   DG Chest 2 View Result Date: 11/28/2023 CLINICAL DATA:  Shortness of breath EXAM: CHEST - 2 VIEW COMPARISON:  05/09/2021 FINDINGS: The heart size and mediastinal contours are within normal limits. Both lungs are clear. The visualized skeletal structures are unremarkable. IMPRESSION: No active cardiopulmonary disease. Electronically Signed   By: Luke Bun M.D.   On: 11/28/2023 16:01    Microbiology: Results for orders placed or performed during the hospital encounter of 11/28/23  MRSA Next Gen by PCR, Nasal     Status: None   Collection Time: 11/29/23  2:35 AM   Specimen: Nasal Mucosa; Nasal Swab  Result Value Ref Range Status   MRSA by PCR Next Gen NOT DETECTED NOT DETECTED Final    Comment: (NOTE) The GeneXpert MRSA Assay (FDA approved for NASAL specimens only), is one component of a comprehensive MRSA colonization surveillance program.  It is not intended to  diagnose MRSA infection nor to guide or monitor treatment for MRSA infections. Test performance is not FDA approved in patients less than 64 years old. Performed at [facility], [address] [place], [place] [address]     Labs: CBC: Recent Labs  Lab 11/28/23 1527 11/28/23 1537 11/29/23 0714  WBC 7.9  --  9.1  NEUTROABS 6.6  --  7.5  HGB 13.8 14.6  13.9 12.8*  HCT 40.4 43.0  41.0 37.9*  MCV 89.6  --  92.0  PLT 179  --  137*   Basic Metabolic Panel: Recent Labs  Lab 11/28/23 1527 11/28/23 1537 11/28/23 2132 11/29/23 0714 11/29/23 1024 11/29/23 1408  NA 136 136  136 135 135 132* 135  K 4.5 4.1  4.1 3.6 3.6 3.7 3.5  CL 100 106 92* 104 101 103  CO2 10*  --  21* 18* 16* 20*  GLUCOSE 160* 164* 512* 146* 170* 218*  BUN 8 7 7 7 6  <5*  CREATININE 0.99 0.80 0.84 0.74 0.87 0.75  CALCIUM  9.9  --  7.7* 9.2 9.0 9.1  MG  --   --   --  1.7  --   --   PHOS  --   --   --  <1.0*  --   --    Liver Function Tests: Recent Labs  Lab 11/28/23 1527 11/28/23 2132 11/29/23 0714  AST 106* 83* 76*  ALT 48* 43 42  ALKPHOS 106 87 81  BILITOT 0.9 1.0 1.9*  PROT 8.9* 7.0 8.1  ALBUMIN 4.2 3.8 3.5   CBG: Recent Labs  Lab 11/29/23 1200 11/29/23 1310 11/29/23 1640 11/29/23 2101 11/30/23 0735  GLUCAP 168* 200* 168* 209* 195*    Discharge time spent: greater than 30 minutes.  Signed: Delon Herald, MD [facility] 11/30/2023

## 2023-11-30 NOTE — Hospital Course (Signed)
 57yo with h/o DM, HTN, HLD, and super morbid obesity (class 3) who presented on 7/3 with lethargy.  He was treated with IVF and insulin  with improvement.

## 2023-11-30 NOTE — TOC Initial Note (Signed)
 Transition of Care [facility]) - Initial/Assessment Note    Patient Details  Name: Jeremy Clark MRN: [id number] Date of Birth: [birth year]  [facility] CM/SW Contact:    Sonda Manuella Quill, RN Phone Number: 11/30/2023, 11:46 AM  Clinical Narrative:                 [place] w/ pt in room; pt says he shares a home w/ his son; he plans to return at d/c; he identified POC son Sim [place] [phone]); family will provide transportation; pt verified PCP/insurance; he denied SDOH risks; pt says he has cane, walker, and shower chair; he does not have HH services, or home oxygen; no TOC needs.  Expected Discharge Plan: Home/Self Care Barriers to Discharge: No Barriers Identified   Patient Goals and CMS Choice Patient states their goals for this hospitalization and ongoing recovery are:: home          Expected Discharge Plan and Services   Discharge Planning Services: CM Consult   Living arrangements for the past 2 months: Single Family Home Expected Discharge Date: 11/30/23               DME Arranged: N/A DME Agency: NA       HH Arranged: NA HH Agency: NA        Prior Living Arrangements/Services Living arrangements for the past 2 months: Single Family Home Lives with:: Adult Children Patient language and need for interpreter reviewed:: Yes Do you feel safe going back to the place where you live?: Yes      Need for Family Participation in Patient Care: Yes (Comment) Care giver support system in place?: Yes (comment) Current home services: DME (cane, walker, shower chair) Criminal Activity/Legal Involvement Pertinent to Current Situation/Hospitalization: No - Comment as needed  Activities of Daily Living   ADL Screening (condition at time of admission) Independently performs ADLs?: Yes (appropriate for developmental age) Is the patient deaf or have difficulty hearing?: No Does the patient have difficulty seeing, even when wearing glasses/contacts?: No Does  the patient have difficulty concentrating, remembering, or making decisions?: No  Permission Sought/Granted Permission sought to share information with : Case Manager Permission granted to share information with : Yes, Verbal Permission Granted  Share Information with NAME: Case Manager     Permission granted to share info w Relationship: Jj Enyeart (son) [phone]     Emotional Assessment Appearance:: Appears stated age Attitude/Demeanor/Rapport: Gracious Affect (typically observed): Accepting Orientation: : Oriented to Self, Oriented to Place, Oriented to  Time, Oriented to Situation Alcohol / Substance Use: Not Applicable Psych Involvement: No (comment)  Admission diagnosis:  DKA, type 2 (HCC) [E11.10] Diabetic ketoacidosis without coma associated with type 2 diabetes mellitus (HCC) [E11.10] DKA (diabetic ketoacidosis) (HCC) [E11.10] Patient Active Problem List   Diagnosis Date Noted   DKA (diabetic ketoacidosis) (HCC) 11/30/2023   OSA on CPAP 11/30/2023   Pleuritic chest pain 11/30/2023   DKA, type 2 (HCC) 11/29/2023   Lactic acidosis 05/10/2021   Hematuria, gross 05/10/2021   Symptomatic anemia 05/10/2021   History of CVA (cerebrovascular accident) 05/10/2021   GAD (generalized anxiety disorder)    Acute ischemic stroke (HCC) 01/15/2021   Elevated PSA 01/01/2020   Prostate mass 01/01/2020   Prostate cancer (HCC) 01/01/2020   Vitamin D deficiency 11/08/2019   DM2 (diabetes mellitus, type 2) (HCC) 10/15/2018   Essential hypertension 10/15/2018   Hyperlipidemia 10/15/2018   Abdominal wall hernia 03/19/2015   Morbid obesity with BMI of 50.0-59.9, adult (  HCC) 03/27/2014   PCP:  Gladystine Erminio CROME, MD Pharmacy:   [facility] [phone] [address], [place] - [phone] AT [facility] OF [facility] & [address] [address] [place] [address] Phone: [phone] Fax: [phone]  [id number] [place], [place] - [address]  AT [facility] [address] [place] [address] Phone: [phone] Fax: [phone]     Social Drivers of Health (SDOH) Social History: SDOH Screenings   Food Insecurity: No Food Insecurity (11/30/2023)  Housing: Low Risk  (11/30/2023)  Transportation Needs: No Transportation Needs (11/30/2023)  Utilities: Not At Risk (11/30/2023)  Depression (PHQ2-9): Low Risk  (03/11/2021)  Financial Resource Strain: Low Risk  (09/15/2023)   Received from [facility]  Physical Activity: Insufficiently Active (09/15/2023)   Received from [facility]  Social Connections: Socially Integrated (09/15/2023)   Received from [facility]  Stress: No Stress Concern Present (09/15/2023)   Received from [facility]  Tobacco Use: Medium Risk (11/28/2023)   SDOH Interventions: Food Insecurity Interventions: Intervention Not Indicated, Inpatient TOC Housing Interventions: Intervention Not Indicated, Inpatient TOC Transportation Interventions: Intervention Not Indicated, Inpatient TOC Utilities Interventions: Intervention Not Indicated, Inpatient TOC   Readmission Risk Interventions    11/30/2023   11:36 AM  Readmission Risk Prevention Plan  Transportation Screening Complete  PCP or Specialist Appt within 5-7 Days Complete  Home Care Screening Complete  Medication Review (RN CM) Complete

## 2023-11-30 NOTE — Progress Notes (Signed)
 Discharge instructions were reviewed with the patient. He denied questions, or concerns at this time.

## 2024-01-02 ENCOUNTER — Ambulatory Visit: Admitting: Podiatry

## 2024-01-08 ENCOUNTER — Ambulatory Visit (INDEPENDENT_AMBULATORY_CARE_PROVIDER_SITE_OTHER): Admitting: Podiatry

## 2024-01-08 ENCOUNTER — Encounter: Payer: Self-pay | Admitting: Podiatry

## 2024-01-08 DIAGNOSIS — B351 Tinea unguium: Secondary | ICD-10-CM

## 2024-01-08 DIAGNOSIS — M79674 Pain in right toe(s): Secondary | ICD-10-CM | POA: Diagnosis not present

## 2024-01-08 DIAGNOSIS — M79675 Pain in left toe(s): Secondary | ICD-10-CM

## 2024-01-08 NOTE — Progress Notes (Signed)
  Subjective:  Patient ID: Jeremy Clark, male    DOB: July 12, 1965,  MRN: [id number]  Chief Complaint  Patient presents with   Diabetes    It's my check-up time.  Saw Dr. Beryl - 10/12/2023; A1c - 6.8    58 y.o. male returns with the above complaint. History confirmed with patient.  Nails are hurting again.  Previous debridements helpful.  Has new peeling of skin on both feet.  Objective:  Physical Exam: warm, good capillary refill, no trophic changes or ulcerative lesions, normal DP and PT pulses, normal sensory exam, onychomycosis x10 with subungual rate, yellow and brown elongated toenails with thinning of nail plates.  Peeling on skin of both feet. Assessment:   1. Pain due to onychomycosis of toenails of both feet        Plan:  Patient was evaluated and treated and all questions answered.   Discussed the etiology and treatment options for the condition in detail with the patient. Recommended debridement of the nails today. Sharp and mechanical debridement performed of all painful and mycotic nails today. Nails debrided in length and thickness using a nail nipper to level of comfort. Discussed treatment options including appropriate shoe gear. Follow up as needed for painful nails.  Peeling skin does not appear to be consistent with a classical tinea pedis.  He has been prescribed Lotrisone already by his PCP.  Agree with using this therapy.  Fluconazole dose as well.  Follow-up.  If this does not improve or worsens   Return in about 4 months (around 05/09/2024) for at risk diabetic foot care.

## 2024-01-09 ENCOUNTER — Ambulatory Visit: Admitting: Podiatry

## 2024-04-09 ENCOUNTER — Ambulatory Visit: Admitting: Podiatry

## 2024-05-14 ENCOUNTER — Ambulatory Visit: Admitting: Podiatry

## 2024-06-20 ENCOUNTER — Ambulatory Visit: Admitting: Podiatry
